# Patient Record
Sex: Female | Born: 1940
Health system: Southern US, Community
[De-identification: ages and names within clinical notes are randomized; demographics above are authoritative.]

## PROBLEM LIST (undated history)

## (undated) DIAGNOSIS — E119 Type 2 diabetes mellitus without complications: Secondary | ICD-10-CM

## (undated) DIAGNOSIS — E079 Disorder of thyroid, unspecified: Secondary | ICD-10-CM

## (undated) DIAGNOSIS — E039 Hypothyroidism, unspecified: Secondary | ICD-10-CM

## (undated) DIAGNOSIS — I10 Essential (primary) hypertension: Secondary | ICD-10-CM

## (undated) DIAGNOSIS — Z972 Presence of dental prosthetic device (complete) (partial): Secondary | ICD-10-CM

## (undated) DIAGNOSIS — H9190 Unspecified hearing loss, unspecified ear: Secondary | ICD-10-CM

## (undated) DIAGNOSIS — M1990 Unspecified osteoarthritis, unspecified site: Secondary | ICD-10-CM

## (undated) HISTORY — PX: CHOLECYSTECTOMY: SHX55

## (undated) HISTORY — PX: OTHER SURGICAL HISTORY: SHX169

## (undated) HISTORY — PX: TUBAL LIGATION: SHX77

## (undated) HISTORY — PX: TONSILLECTOMY: SUR1361

## (undated) HISTORY — PX: APPENDECTOMY: SHX54

## (undated) HISTORY — PX: URETHRAL DILATION: SUR417

## (undated) HISTORY — PX: TOTAL HIP ARTHROPLASTY: SHX124

## (undated) HISTORY — PX: HAND SURGERY: SHX662

## (undated) HISTORY — PX: THYROIDECTOMY: SHX17

---

## 2004-07-22 ENCOUNTER — Encounter: Admission: RE | Admit: 2004-07-22 | Discharge: 2004-10-20 | Payer: Self-pay | Admitting: Family Medicine

## 2004-09-07 ENCOUNTER — Emergency Department (HOSPITAL_COMMUNITY): Admission: EM | Admit: 2004-09-07 | Discharge: 2004-09-08 | Payer: Self-pay | Admitting: Emergency Medicine

## 2013-10-01 ENCOUNTER — Encounter (HOSPITAL_COMMUNITY): Payer: Self-pay | Admitting: Emergency Medicine

## 2013-10-01 ENCOUNTER — Inpatient Hospital Stay (HOSPITAL_COMMUNITY)
Admission: EM | Admit: 2013-10-01 | Discharge: 2013-10-03 | DRG: 641 | Disposition: A | Discharge status: Home / Self Care | Payer: Medicare Other | Attending: Internal Medicine | Admitting: Internal Medicine

## 2013-10-01 DIAGNOSIS — A088 Other specified intestinal infections: Secondary | ICD-10-CM | POA: Diagnosis present

## 2013-10-01 DIAGNOSIS — E860 Dehydration: Secondary | ICD-10-CM | POA: Diagnosis present

## 2013-10-01 DIAGNOSIS — E119 Type 2 diabetes mellitus without complications: Secondary | ICD-10-CM | POA: Diagnosis present

## 2013-10-01 DIAGNOSIS — Z9089 Acquired absence of other organs: Secondary | ICD-10-CM

## 2013-10-01 DIAGNOSIS — Z602 Problems related to living alone: Secondary | ICD-10-CM

## 2013-10-01 DIAGNOSIS — Z96649 Presence of unspecified artificial hip joint: Secondary | ICD-10-CM

## 2013-10-01 DIAGNOSIS — E876 Hypokalemia: Principal | ICD-10-CM | POA: Diagnosis present

## 2013-10-01 DIAGNOSIS — I10 Essential (primary) hypertension: Secondary | ICD-10-CM | POA: Diagnosis present

## 2013-10-01 DIAGNOSIS — N390 Urinary tract infection, site not specified: Secondary | ICD-10-CM | POA: Diagnosis present

## 2013-10-01 DIAGNOSIS — R197 Diarrhea, unspecified: Secondary | ICD-10-CM | POA: Diagnosis present

## 2013-10-01 DIAGNOSIS — E871 Hypo-osmolality and hyponatremia: Secondary | ICD-10-CM | POA: Diagnosis present

## 2013-10-01 DIAGNOSIS — R112 Nausea with vomiting, unspecified: Secondary | ICD-10-CM

## 2013-10-01 DIAGNOSIS — Z880 Allergy status to penicillin: Secondary | ICD-10-CM

## 2013-10-01 DIAGNOSIS — I498 Other specified cardiac arrhythmias: Secondary | ICD-10-CM | POA: Diagnosis present

## 2013-10-01 DIAGNOSIS — R9431 Abnormal electrocardiogram [ECG] [EKG]: Secondary | ICD-10-CM | POA: Diagnosis present

## 2013-10-01 DIAGNOSIS — Z79899 Other long term (current) drug therapy: Secondary | ICD-10-CM

## 2013-10-01 DIAGNOSIS — E039 Hypothyroidism, unspecified: Secondary | ICD-10-CM | POA: Diagnosis present

## 2013-10-01 HISTORY — DX: Type 2 diabetes mellitus without complications: E11.9

## 2013-10-01 HISTORY — DX: Disorder of thyroid, unspecified: E07.9

## 2013-10-01 HISTORY — DX: Essential (primary) hypertension: I10

## 2013-10-01 LAB — COMPREHENSIVE METABOLIC PANEL
ALBUMIN: 3.6 g/dL (ref 3.5–5.2)
ALK PHOS: 111 U/L (ref 39–117)
ALT: 14 U/L (ref 0–35)
AST: 31 U/L (ref 0–37)
BILIRUBIN TOTAL: 0.5 mg/dL (ref 0.3–1.2)
BUN: 12 mg/dL (ref 6–23)
CO2: 30 mEq/L (ref 19–32)
Calcium: 8.9 mg/dL (ref 8.4–10.5)
Chloride: 95 mEq/L — ABNORMAL LOW (ref 96–112)
Creatinine, Ser: 0.76 mg/dL (ref 0.50–1.10)
GFR calc Af Amer: >90 mL/min (ref >90)
GFR calc non Af Amer: 81 mL/min — ABNORMAL LOW (ref >90)
Glucose, Bld: 142 mg/dL — ABNORMAL HIGH (ref 70–99)
POTASSIUM: 2.7 meq/L — AB (ref 3.7–5.3)
Sodium: 136 mEq/L — ABNORMAL LOW (ref 137–147)
TOTAL PROTEIN: 6.9 g/dL (ref 6.0–8.3)

## 2013-10-01 LAB — URINE MICROSCOPIC-ADD ON

## 2013-10-01 LAB — URINALYSIS, ROUTINE W REFLEX MICROSCOPIC
Bilirubin Urine: NEGATIVE
GLUCOSE, UA: NEGATIVE mg/dL
Hgb urine dipstick: NEGATIVE
KETONES UR: NEGATIVE mg/dL
Nitrite: NEGATIVE
PH: 6.5 (ref 5.0–8.0)
PROTEIN: NEGATIVE mg/dL
Specific Gravity, Urine: 1.019 (ref 1.005–1.030)
Urobilinogen, UA: 1 mg/dL (ref 0.0–1.0)

## 2013-10-01 LAB — CBC WITH DIFFERENTIAL/PLATELET
BASOS ABS: 0 10*3/uL (ref 0.0–0.1)
BASOS PCT: 0 % (ref 0–1)
Eosinophils Absolute: 0 10*3/uL (ref 0.0–0.7)
Eosinophils Relative: 0 % (ref 0–5)
HCT: 38.5 % (ref 36.0–46.0)
HEMOGLOBIN: 13.3 g/dL (ref 12.0–15.0)
Lymphocytes Relative: 11 % — ABNORMAL LOW (ref 12–46)
Lymphs Abs: 0.8 10*3/uL (ref 0.7–4.0)
MCH: 29.8 pg (ref 26.0–34.0)
MCHC: 34.5 g/dL (ref 30.0–36.0)
MCV: 86.1 fL (ref 78.0–100.0)
MONOS PCT: 9 % (ref 3–12)
Monocytes Absolute: 0.6 10*3/uL (ref 0.1–1.0)
NEUTROS ABS: 5.4 10*3/uL (ref 1.7–7.7)
NEUTROS PCT: 80 % — AB (ref 43–77)
PLATELETS: 173 10*3/uL (ref 150–400)
RBC: 4.47 MIL/uL (ref 3.87–5.11)
RDW: 12.8 % (ref 11.5–15.5)
WBC: 6.7 10*3/uL (ref 4.0–10.5)

## 2013-10-01 LAB — CBG MONITORING, ED: GLUCOSE-CAPILLARY: 138 mg/dL — AB (ref 70–99)

## 2013-10-01 MED ORDER — POTASSIUM CHLORIDE CRYS ER 20 MEQ PO TBCR: 20.0000 meq | EXTENDED_RELEASE_TABLET | Freq: Two times a day (BID) | ORAL | Status: DC

## 2013-10-01 MED ORDER — POTASSIUM CHLORIDE 10 MEQ/100ML IV SOLN
10.0000 meq | INTRAVENOUS | Status: AC
  Administered 2013-10-01 – 2013-10-02 (×3): 10 meq via INTRAVENOUS
  Filled 2013-10-01 (×3): qty 100

## 2013-10-01 MED ORDER — SODIUM CHLORIDE 0.9 % IV BOLUS (SEPSIS)
1000.0000 mL | Freq: Once | INTRAVENOUS | Status: AC
  Administered 2013-10-01: 1000 mL via INTRAVENOUS

## 2013-10-01 MED ORDER — ONDANSETRON HCL 4 MG PO TABS: 4.0000 mg | ORAL_TABLET | Freq: Four times a day (QID) | ORAL | Status: DC

## 2013-10-01 MED ORDER — POTASSIUM CHLORIDE CRYS ER 20 MEQ PO TBCR
40.0000 meq | EXTENDED_RELEASE_TABLET | Freq: Once | ORAL | Status: AC
  Administered 2013-10-01: 40 meq via ORAL
  Filled 2013-10-01: qty 2

## 2013-10-01 MED ORDER — SODIUM CHLORIDE 0.9 % IV SOLN
INTRAVENOUS | Status: DC
  Administered 2013-10-01: 23:00:00 via INTRAVENOUS

## 2013-10-01 MED ORDER — ONDANSETRON HCL 4 MG/2ML IJ SOLN
4.0000 mg | Freq: Once | INTRAMUSCULAR | Status: AC
  Administered 2013-10-01: 4 mg via INTRAVENOUS
  Filled 2013-10-01: qty 2

## 2013-10-01 NOTE — ED Notes (Signed)
Pt states that she has been nauseous x 2 days; pt states that it got progressively worse last pm; pt c/o generalized weakness; N/V/D; pt reports no vomiting today and reports diarrhea x 3 -4 episodes; pt states that she is a diabetic and has had this in the past where it affected her blood sugar

## 2013-10-01 NOTE — Discharge Instructions (Signed)
Drink plenty of fluids. Take the potassium pills until gone. Use the zofran for nausea or vomiting. Take imodium OTC for diarrhea if needed. Have your doctor recheck your potassium level next week. Return to the ED if you get worse, such as having uncontrolled vomiting or diarrhea, weakness, fever, abdominal pain.    Hypokalemia Hypokalemia means that the amount of potassium in the blood is lower than normal.Potassium is a chemical, called an electrolyte, that helps regulate the amount of fluid in the body. It also stimulates muscle contraction and helps nerves function properly.Most of the body's potassium is inside of cells, and only a very small amount is in the blood. Because the amount in the blood is so small, minor changes can be life-threatening. CAUSES  Antibiotics.  Diarrhea or vomiting.  Using laxatives too much, which can cause diarrhea.  Chronic kidney disease.  Water pills (diuretics).  Eating disorders (bulimia).  Low magnesium level.  Sweating a lot. SIGNS AND SYMPTOMS  Weakness.  Constipation.  Fatigue.  Muscle cramps.  Mental confusion.  Skipped heartbeats or irregular heartbeat (palpitations).  Tingling or numbness. DIAGNOSIS  Your health care provider can diagnose hypokalemia with blood tests. In addition to checking your potassium level, your health care provider may also check other lab tests. TREATMENT Hypokalemia can be treated with potassium supplements taken by mouth or adjustments in your current medicines. If your potassium level is very low, you may need to get potassium through a vein (IV) and be monitored in the hospital. A diet high in potassium is also helpful. Foods high in potassium are:  Nuts, such as peanuts and pistachios.  Seeds, such as sunflower seeds and pumpkin seeds.  Peas, lentils, and lima beans.  Whole grain and bran cereals and breads.  Fresh fruit and vegetables, such as apricots, avocado, bananas, cantaloupe, kiwi,  oranges, tomatoes, asparagus, and potatoes.  Orange and tomato juices.  Red meats.  Fruit yogurt. HOME CARE INSTRUCTIONS  Take all medicines as prescribed by your health care provider.  Maintain a healthy diet by including nutritious food, such as fruits, vegetables, nuts, whole grains, and lean meats.  If you are taking a laxative, be sure to follow the directions on the label. SEEK MEDICAL CARE IF:  Your weakness gets worse.  You feel your heart pounding or racing.  You are vomiting or having diarrhea.  You are diabetic and having trouble keeping your blood glucose in the normal range. SEEK IMMEDIATE MEDICAL CARE IF:  You have chest pain, shortness of breath, or dizziness.  You are vomiting or having diarrhea for more than 2 days.  You faint. MAKE SURE YOU:   Understand these instructions.  Will watch your condition.  Will get help right away if you are not doing well or get worse. Document Released: 03/29/2005 Document Revised: 01/17/2013 Document Reviewed: 09/29/2012 [facility] Patient Information 2015 [place], [place]. This information is not intended to replace advice given to you by your health care provider. Make sure you discuss any questions you have with your health care provider.  Nausea and Vomiting Nausea is a sick feeling that often comes before throwing up (vomiting). Vomiting is a reflex where stomach contents come out of your mouth. Vomiting can cause severe loss of body fluids (dehydration). Children and elderly adults can become dehydrated quickly, especially if they also have diarrhea. Nausea and vomiting are symptoms of a condition or disease. It is important to find the cause of your symptoms. CAUSES   Direct irritation of the stomach  lining. This irritation can result from increased acid production (gastroesophageal reflux disease), infection, food poisoning, taking certain medicines (such as nonsteroidal anti-inflammatory drugs), alcohol use, or  tobacco use.  Signals from the brain.These signals could be caused by a headache, heat exposure, an inner ear disturbance, increased pressure in the brain from injury, infection, a tumor, or a concussion, pain, emotional stimulus, or metabolic problems.  An obstruction in the gastrointestinal tract (bowel obstruction).  Illnesses such as diabetes, hepatitis, gallbladder problems, appendicitis, kidney problems, cancer, sepsis, atypical symptoms of a heart attack, or eating disorders.  Medical treatments such as chemotherapy and radiation.  Receiving medicine that makes you sleep (general anesthetic) during surgery. DIAGNOSIS Your caregiver may ask for tests to be done if the problems do not improve after a few days. Tests may also be done if symptoms are severe or if the reason for the nausea and vomiting is not clear. Tests may include:  Urine tests.  Blood tests.  Stool tests.  Cultures (to look for evidence of infection).  X-rays or other imaging studies. Test results can help your caregiver make decisions about treatment or the need for additional tests. TREATMENT You need to stay well hydrated. Drink frequently but in small amounts.You may wish to drink water, sports drinks, clear broth, or eat frozen ice pops or gelatin dessert to help stay hydrated.When you eat, eating slowly may help prevent nausea.There are also some antinausea medicines that may help prevent nausea. HOME CARE INSTRUCTIONS   Take all medicine as directed by your caregiver.  If you do not have an appetite, do not force yourself to eat. However, you must continue to drink fluids.  If you have an appetite, eat a normal diet unless your caregiver tells you differently.  Eat a variety of complex carbohydrates (rice, wheat, potatoes, bread), lean meats, yogurt, fruits, and vegetables.  Avoid high-fat foods because they are more difficult to digest.  Drink enough water and fluids to keep your urine clear or  pale yellow.  If you are dehydrated, ask your caregiver for specific rehydration instructions. Signs of dehydration may include:  Severe thirst.  Dry lips and mouth.  Dizziness.  Dark urine.  Decreasing urine frequency and amount.  Confusion.  Rapid breathing or pulse. SEEK IMMEDIATE MEDICAL CARE IF:   You have blood or brown flecks (like coffee grounds) in your vomit.  You have black or bloody stools.  You have a severe headache or stiff neck.  You are confused.  You have severe abdominal pain.  You have chest pain or trouble breathing.  You do not urinate at least once every 8 hours.  You develop cold or clammy skin.  You continue to vomit for longer than 24 to 48 hours.  You have a fever. MAKE SURE YOU:   Understand these instructions.  Will watch your condition.  Will get help right away if you are not doing well or get worse. Document Released: 03/29/2005 Document Revised: 06/21/2011 Document Reviewed: 08/26/2010 [facility] Patient Information 2015 [place], [place]. This information is not intended to replace advice given to you by your health care provider. Make sure you discuss any questions you have with your health care provider.  Diarrhea Diarrhea is frequent loose and watery bowel movements. It can cause you to feel weak and dehydrated. Dehydration can cause you to become tired and thirsty, have a dry mouth, and have decreased urination that often is dark yellow. Diarrhea is a sign of another problem, most often an infection that  will not last long. In most cases, diarrhea typically lasts 2-3 days. However, it can last longer if it is a sign of something more serious. It is important to treat your diarrhea as directed by your caregive to lessen or prevent future episodes of diarrhea. CAUSES  Some common causes include:  Gastrointestinal infections caused by viruses, bacteria, or parasites.  Food poisoning or food allergies.  Certain medicines, such as  antibiotics, chemotherapy, and laxatives.  Artificial sweeteners and fructose.  Digestive disorders. HOME CARE INSTRUCTIONS  Ensure adequate fluid intake (hydration): have 1 cup (8 oz) of fluid for each diarrhea episode. Avoid fluids that contain simple sugars or sports drinks, fruit juices, whole milk products, and sodas. Your urine should be clear or pale yellow if you are drinking enough fluids. Hydrate with an oral rehydration solution that you can purchase at pharmacies, retail stores, and online. You can prepare an oral rehydration solution at home by mixing the following ingredients together:   - tsp table salt.   tsp baking soda.   tsp salt substitute containing potassium chloride.  1  tablespoons sugar.  1 L (34 oz) of water.  Certain foods and beverages may increase the speed at which food moves through the gastrointestinal (GI) tract. These foods and beverages should be avoided and include:  Caffeinated and alcoholic beverages.  High-fiber foods, such as raw fruits and vegetables, nuts, seeds, and whole grain breads and cereals.  Foods and beverages sweetened with sugar alcohols, such as xylitol, sorbitol, and mannitol.  Some foods may be well tolerated and may help thicken stool including:  Starchy foods, such as rice, toast, pasta, low-sugar cereal, oatmeal, grits, baked potatoes, crackers, and bagels.  Bananas.  Applesauce.  Add probiotic-rich foods to help increase healthy bacteria in the GI tract, such as yogurt and fermented milk products.  Wash your hands well after each diarrhea episode.  Only take over-the-counter or prescription medicines as directed by your caregiver.  Take a warm bath to relieve any burning or pain from frequent diarrhea episodes. SEEK IMMEDIATE MEDICAL CARE IF:   You are unable to keep fluids down.  You have persistent vomiting.  You have blood in your stool, or your stools are black and tarry.  You do not urinate in 6-8  hours, or there is only a small amount of very dark urine.  You have abdominal pain that increases or localizes.  You have weakness, dizziness, confusion, or lightheadedness.  You have a severe headache.  Your diarrhea gets worse or does not get better.  You have a fever or persistent symptoms for more than 2-3 days.  You have a fever and your symptoms suddenly get worse. MAKE SURE YOU:   Understand these instructions.  Will watch your condition.  Will get help right away if you are not doing well or get worse. Document Released: 03/19/2002 Document Revised: 03/15/2012 Document Reviewed: 12/05/2011 [facility] Patient Information 2015 [place], [place]. This information is not intended to replace advice given to you by your health care provider. Make sure you discuss any questions you have with your health care provider.

## 2013-10-01 NOTE — Progress Notes (Signed)
  CARE MANAGEMENT ED NOTE 10/01/2013  Patient:  [name],[name]   Account Number:  [id number]  Date Initiated:  10/01/2013  Documentation initiated by:  Radford PaxFERRERO,AMY  Subjective/Objective Assessment:   Patient presents to Ed with genealized weakness and N/V/D     Subjective/Objective Assessment Detail:     Action/Plan:   Action/Plan Detail:   Anticipated DC Date:       Status Recommendation to Physician:   Result of Recommendation:    Other ED Services  Consult Working Plan    DC Planning Services  Other  PCP issues    Choice offered to / List presented to:            Status of service:  In process, will continue to follow  ED Comments:   ED Comments Detail:  EDCM spoke to patient at bedside.  [facility] [place].  Patient reports she see the PA not the regular doctor.  System updated.

## 2013-10-01 NOTE — ED Provider Notes (Addendum)
CSN: [id number]     Arrival date & time 10/01/13  2046 History   First MD Initiated Contact with Patient 10/01/13 2120     Chief Complaint  Patient presents with  . Weakness  . Diarrhea     (Consider location/radiation/quality/duration/timing/severity/associated sxs/prior Treatment) HPI Patient reports she started having some mild nausea last night about 9:00. It progressed throughout the night and today. She states she finally vomited when she arrived to the ED. She also had large amounts of diarrhea x3 at home and twice while in the ED. She was having some diffuse abdominal pain but that has resolved since she had the diarrhea. She has diet controlled diabetes and states her blood sugar at home today was 111. She denies seeing any blood in her bowel movements or vomitus. She denies having melena. She states she has been having some dizziness and lightheadedness. She denies any fever. She denies eating anything she thinks may have made her ill. She denies being around any one else who is ill.  PCP Dr Nathanial RancherHamrick  Past Medical History  Diagnosis Date  . Thyroid disease   . Diabetes mellitus without complication    Past Surgical History  Procedure Laterality Date  . Total hip arthroplasty      bilateral  . Cholecystectomy     No family history on file. History  Substance Use Topics  . Smoking status: Never Smoker   . Smokeless tobacco: Not on file  . Alcohol Use: No   Widowed Lives at home Lives alone  [place]   Grav Para Term Preterm Abortions TAB SAB Ect Mult Living                 Review of Systems  All other systems reviewed and are negative.     Allergies  Penicillins  Home Medications   Prior to Admission medications   Medication Sig Start Date End Date Taking? Authorizing Provider  hydrochlorothiazide (HYDRODIURIL) 50 MG tablet Take 50 mg by mouth daily.   Yes Historical Provider, MD  levothyroxine (SYNTHROID, LEVOTHROID) 100 MCG tablet Take 100 mcg by  mouth daily before breakfast.  09/04/13  Yes Historical Provider, MD  traMADol (ULTRAM) 50 MG tablet Take 50 mg by mouth every 6 (six) hours as needed for moderate pain.  09/11/13  Yes Historical Provider, MD  ondansetron (ZOFRAN) 4 MG tablet Take 1 tablet (4 mg total) by mouth every 6 (six) hours. 10/01/13   Ward GivensIva L Zyrell Carmean, MD  potassium chloride SA (K-DUR,KLOR-CON) 20 MEQ tablet Take 1 tablet (20 mEq total) by mouth 2 (two) times daily. 10/01/13   Ward GivensIva L Maximino Cozzolino, MD   BP 114/65  Pulse 48  Temp(Src) 98.5 F (36.9 C) (Oral)  Resp 16  SpO2 96%  Vital signs normal except for bradycardia which she states is typical Physical Exam  Nursing note and vitals reviewed. Constitutional: She is oriented to person, place, and time. She appears well-developed and well-nourished.  Non-toxic appearance. She does not appear ill. No distress.  HENT:  Head: Normocephalic and atraumatic.  Right Ear: External ear normal.  Left Ear: External ear normal.  Nose: Nose normal. No mucosal edema or rhinorrhea.  Mouth/Throat: Mucous membranes are normal. No dental abscesses or uvula swelling.  Tongue slightly dry  Eyes: Conjunctivae and EOM are normal. Pupils are equal, round, and reactive to light.  Neck: Normal range of motion and full passive range of motion without pain. Neck supple.  Cardiovascular: Normal rate, regular rhythm and normal heart  sounds.  Exam reveals no gallop and no friction rub.   No murmur heard. Pulmonary/Chest: Effort normal and breath sounds normal. No respiratory distress. She has no wheezes. She has no rhonchi. She has no rales. She exhibits no tenderness and no crepitus.  Abdominal: Soft. Normal appearance and bowel sounds are normal. She exhibits no distension. There is no tenderness. There is no rebound and no guarding.  Musculoskeletal: Normal range of motion. She exhibits no edema and no tenderness.  Moves all extremities well.   Neurological: She is alert and oriented to person, place, and  time. She has normal strength. No cranial nerve deficit.  Skin: Skin is warm, dry and intact. No rash noted. No erythema. There is pallor.  Psychiatric: She has a normal mood and affect. Her speech is normal and behavior is normal. Her mood appears not anxious.    ED Course  Procedures (including critical care time)  Medications  0.9 %  sodium chloride infusion ( Intravenous New Bag/Given 10/01/13 2234)  potassium chloride 10 mEq in 100 mL IVPB (10 mEq Intravenous New Bag/Given 10/01/13 2335)  sodium chloride 0.9 % bolus 1,000 mL (0 mLs Intravenous Stopped 10/01/13 2232)  ondansetron (ZOFRAN) injection 4 mg (4 mg Intravenous Given 10/01/13 2209)  potassium chloride SA (K-DUR,KLOR-CON) CR tablet 40 mEq (40 mEq Oral Given 10/01/13 2326)    I discussed patient's test results, she is adamant she does not want to be admitted. She states she is feeling much better. She was given both oral and IV potassium in the ED. At 23:45 she feels ready to try oral fluids. We discussed avoiding milk until the diarrhea is gone.   Pt left with Dr Nicanor Alcon at change of shift to finish her IV potassium runs.   Labs Review Results for orders placed during the hospital encounter of 10/01/13  CBC WITH DIFFERENTIAL      Result Value Ref Range   WBC 6.7  4.0 - 10.5 K/uL   RBC 4.47  3.87 - 5.11 MIL/uL   Hemoglobin 13.3  12.0 - 15.0 g/dL   HCT 16.1  09.6 - 04.5 %   MCV 86.1  78.0 - 100.0 fL   MCH 29.8  26.0 - 34.0 pg   MCHC 34.5  30.0 - 36.0 g/dL   RDW 40.9  81.1 - 91.4 %   Platelets 173  150 - 400 K/uL   Neutrophils Relative % 80 (*) 43 - 77 %   Neutro Abs 5.4  1.7 - 7.7 K/uL   Lymphocytes Relative 11 (*) 12 - 46 %   Lymphs Abs 0.8  0.7 - 4.0 K/uL   Monocytes Relative 9  3 - 12 %   Monocytes Absolute 0.6  0.1 - 1.0 K/uL   Eosinophils Relative 0  0 - 5 %   Eosinophils Absolute 0.0  0.0 - 0.7 K/uL   Basophils Relative 0  0 - 1 %   Basophils Absolute 0.0  0.0 - 0.1 K/uL  COMPREHENSIVE METABOLIC PANEL       Result Value Ref Range   Sodium 136 (*) 137 - 147 mEq/L   Potassium 2.7 (*) 3.7 - 5.3 mEq/L   Chloride 95 (*) 96 - 112 mEq/L   CO2 30  19 - 32 mEq/L   Glucose, Bld 142 (*) 70 - 99 mg/dL   BUN 12  6 - 23 mg/dL   Creatinine, Ser 7.82  0.50 - 1.10 mg/dL   Calcium 8.9  8.4 - 95.6 mg/dL  Total Protein 6.9  6.0 - 8.3 g/dL   Albumin 3.6  3.5 - 5.2 g/dL   AST 31  0 - 37 U/L   ALT 14  0 - 35 U/L   Alkaline Phosphatase 111  39 - 117 U/L   Total Bilirubin 0.5  0.3 - 1.2 mg/dL   GFR calc non Af Amer 81 (*) >90 mL/min   GFR calc Af Amer >90  >90 mL/min  URINALYSIS, ROUTINE W REFLEX MICROSCOPIC      Result Value Ref Range   Color, Urine YELLOW  YELLOW   APPearance CLOUDY (*) CLEAR   Specific Gravity, Urine 1.019  1.005 - 1.030   pH 6.5  5.0 - 8.0   Glucose, UA NEGATIVE  NEGATIVE mg/dL   Hgb urine dipstick NEGATIVE  NEGATIVE   Bilirubin Urine NEGATIVE  NEGATIVE   Ketones, ur NEGATIVE  NEGATIVE mg/dL   Protein, ur NEGATIVE  NEGATIVE mg/dL   Urobilinogen, UA 1.0  0.0 - 1.0 mg/dL   Nitrite NEGATIVE  NEGATIVE   Leukocytes, UA LARGE (*) NEGATIVE  URINE MICROSCOPIC-ADD ON      Result Value Ref Range   Squamous Epithelial / LPF RARE  RARE   WBC, UA 3-6  <3 WBC/hpf   RBC / HPF 0-2  <3 RBC/hpf   Bacteria, UA FEW (*) RARE  CBG MONITORING, ED      Result Value Ref Range   Glucose-Capillary 138 (*) 70 - 99 mg/dL   Laboratory interpretation all normal except for hypokalemia, mild hyponatremia and low chloride     Imaging Review No results found.   EKG Interpretation   Date/Time:  Monday October 01 2013 23:43:36 EDT Ventricular Rate:  46 PR Interval:  173 QRS Duration: 102 QT Interval:  609 QTC Calculation: 533 R Axis:   -38 Text Interpretation:  Sinus bradycardia Left axis deviation Low voltage,  precordial leads Borderline T abnormalities, diffuse leads Prolonged QT  interval U waves present No old tracing to compare Confirmed by Calin Ellery   MD-I, Melis Trochez ([id number]) on 10/01/2013 11:46:15 PM       MDM   Final diagnoses:  Nausea vomiting and diarrhea  Dehydration  Hypokalemia    New Prescriptions   ONDANSETRON (ZOFRAN) 4 MG TABLET    Take 1 tablet (4 mg total) by mouth every 6 (six) hours.   POTASSIUM CHLORIDE SA (K-DUR,KLOR-CON) 20 MEQ TABLET    Take 1 tablet (20 mEq total) by mouth 2 (two) times daily.    Plan discharge   Devoria AlbeIva Maury Bamba, MD, [facility]FACEP     Leona Pressly L Luvinia Lucy, MD 10/01/13 2356  Ward GivensIva L Eh Sesay, MD 10/02/13 [phone]

## 2013-10-02 ENCOUNTER — Encounter (HOSPITAL_COMMUNITY): Payer: Self-pay | Admitting: Internal Medicine

## 2013-10-02 DIAGNOSIS — R197 Diarrhea, unspecified: Secondary | ICD-10-CM

## 2013-10-02 DIAGNOSIS — E119 Type 2 diabetes mellitus without complications: Secondary | ICD-10-CM | POA: Diagnosis present

## 2013-10-02 DIAGNOSIS — R112 Nausea with vomiting, unspecified: Secondary | ICD-10-CM

## 2013-10-02 DIAGNOSIS — I10 Essential (primary) hypertension: Secondary | ICD-10-CM | POA: Diagnosis present

## 2013-10-02 DIAGNOSIS — E876 Hypokalemia: Principal | ICD-10-CM

## 2013-10-02 DIAGNOSIS — E039 Hypothyroidism, unspecified: Secondary | ICD-10-CM

## 2013-10-02 LAB — CBC WITH DIFFERENTIAL/PLATELET
Basophils Absolute: 0 10*3/uL (ref 0.0–0.1)
Basophils Relative: 0 % (ref 0–1)
EOS ABS: 0 10*3/uL (ref 0.0–0.7)
EOS PCT: 0 % (ref 0–5)
HEMATOCRIT: 34.4 % — AB (ref 36.0–46.0)
HEMOGLOBIN: 11.7 g/dL — AB (ref 12.0–15.0)
LYMPHS ABS: 0.6 10*3/uL — AB (ref 0.7–4.0)
LYMPHS PCT: 12 % (ref 12–46)
MCH: 29.5 pg (ref 26.0–34.0)
MCHC: 34 g/dL (ref 30.0–36.0)
MCV: 86.6 fL (ref 78.0–100.0)
MONO ABS: 0.5 10*3/uL (ref 0.1–1.0)
MONOS PCT: 10 % (ref 3–12)
Neutro Abs: 4 10*3/uL (ref 1.7–7.7)
Neutrophils Relative %: 78 % — ABNORMAL HIGH (ref 43–77)
Platelets: 134 10*3/uL — ABNORMAL LOW (ref 150–400)
RBC: 3.97 MIL/uL (ref 3.87–5.11)
RDW: 13 % (ref 11.5–15.5)
WBC: 5.2 10*3/uL (ref 4.0–10.5)

## 2013-10-02 LAB — COMPREHENSIVE METABOLIC PANEL
ALBUMIN: 2.9 g/dL — AB (ref 3.5–5.2)
ALT: 12 U/L (ref 0–35)
AST: 26 U/L (ref 0–37)
Alkaline Phosphatase: 86 U/L (ref 39–117)
BUN: 8 mg/dL (ref 6–23)
CHLORIDE: 105 meq/L (ref 96–112)
CO2: 26 mEq/L (ref 19–32)
CREATININE: 0.73 mg/dL (ref 0.50–1.10)
Calcium: 7.9 mg/dL — ABNORMAL LOW (ref 8.4–10.5)
GFR calc Af Amer: >90 mL/min (ref >90)
GFR calc non Af Amer: 82 mL/min — ABNORMAL LOW (ref >90)
Glucose, Bld: 95 mg/dL (ref 70–99)
Potassium: 3 mEq/L — ABNORMAL LOW (ref 3.7–5.3)
Sodium: 140 mEq/L (ref 137–147)
TOTAL PROTEIN: 5.7 g/dL — AB (ref 6.0–8.3)
Total Bilirubin: 0.3 mg/dL (ref 0.3–1.2)

## 2013-10-02 LAB — MAGNESIUM
MAGNESIUM: 1.8 mg/dL (ref 1.5–2.5)
Magnesium: 1.6 mg/dL (ref 1.5–2.5)

## 2013-10-02 LAB — GLUCOSE, CAPILLARY
GLUCOSE-CAPILLARY: 82 mg/dL (ref 70–99)
GLUCOSE-CAPILLARY: 104 mg/dL — AB (ref 70–99)
Glucose-Capillary: 89 mg/dL (ref 70–99)
Glucose-Capillary: 78 mg/dL (ref 70–99)
Glucose-Capillary: 101 mg/dL — ABNORMAL HIGH (ref 70–99)

## 2013-10-02 LAB — CLOSTRIDIUM DIFFICILE BY PCR: CDIFFPCR: NEGATIVE

## 2013-10-02 MED ORDER — POTASSIUM CHLORIDE 10 MEQ/100ML IV SOLN: 10.0000 meq | INTRAVENOUS | Status: DC

## 2013-10-02 MED ORDER — ACETAMINOPHEN 650 MG RE SUPP: 650.0000 mg | Freq: Four times a day (QID) | RECTAL | Status: DC | PRN

## 2013-10-02 MED ORDER — POTASSIUM CHLORIDE IN NACL 20-0.9 MEQ/L-% IV SOLN
Freq: Once | INTRAVENOUS | Status: AC
  Administered 2013-10-02: 06:00:00 via INTRAVENOUS
  Filled 2013-10-02: qty 1000

## 2013-10-02 MED ORDER — GLUCERNA SHAKE PO LIQD
237.0000 mL | Freq: Two times a day (BID) | ORAL | Status: DC
  Administered 2013-10-02 – 2013-10-03 (×2): 237 mL via ORAL
  Filled 2013-10-02 (×3): qty 237

## 2013-10-02 MED ORDER — POTASSIUM CHLORIDE CRYS ER 20 MEQ PO TBCR: 40.0000 meq | EXTENDED_RELEASE_TABLET | Freq: Once | ORAL | Status: DC

## 2013-10-02 MED ORDER — SODIUM CHLORIDE 0.9 % IV SOLN
Freq: Once | INTRAVENOUS | Status: AC
  Administered 2013-10-02: 03:00:00 via INTRAVENOUS

## 2013-10-02 MED ORDER — ONDANSETRON HCL 4 MG/2ML IJ SOLN: 4.0000 mg | Freq: Four times a day (QID) | INTRAMUSCULAR | Status: DC | PRN

## 2013-10-02 MED ORDER — LEVOTHYROXINE SODIUM 100 MCG PO TABS
100.0000 ug | ORAL_TABLET | Freq: Every day | ORAL | Status: DC
Start: 2013-10-02 — End: 2013-10-03
  Administered 2013-10-02 – 2013-10-03 (×2): 100 ug via ORAL
  Filled 2013-10-02 (×3): qty 1

## 2013-10-02 MED ORDER — ENOXAPARIN SODIUM 40 MG/0.4ML ~~LOC~~ SOLN
40.0000 mg | SUBCUTANEOUS | Status: DC
  Administered 2013-10-02 – 2013-10-03 (×2): 40 mg via SUBCUTANEOUS
  Filled 2013-10-02 (×2): qty 0.4

## 2013-10-02 MED ORDER — ACETAMINOPHEN 325 MG PO TABS: 650.0000 mg | ORAL_TABLET | Freq: Four times a day (QID) | ORAL | Status: DC | PRN

## 2013-10-02 MED ORDER — SODIUM CHLORIDE 0.9 % IJ SOLN
3.0000 mL | Freq: Two times a day (BID) | INTRAMUSCULAR | Status: DC
  Administered 2013-10-02: 3 mL via INTRAVENOUS

## 2013-10-02 MED ORDER — INSULIN ASPART 100 UNIT/ML ~~LOC~~ SOLN: 0.0000 [IU] | Freq: Three times a day (TID) | SUBCUTANEOUS | Status: DC

## 2013-10-02 MED ORDER — HYDRALAZINE HCL 20 MG/ML IJ SOLN
10.0000 mg | INTRAMUSCULAR | Status: DC | PRN
Start: 2013-10-02 — End: 2013-10-03
  Filled 2013-10-02: qty 0.5

## 2013-10-02 MED ORDER — ONDANSETRON HCL 4 MG PO TABS: 4.0000 mg | ORAL_TABLET | Freq: Four times a day (QID) | ORAL | Status: DC | PRN

## 2013-10-02 MED ORDER — LOPERAMIDE HCL 2 MG PO CAPS: 2.0000 mg | ORAL_CAPSULE | ORAL | Status: DC | PRN

## 2013-10-02 MED ORDER — POTASSIUM CHLORIDE CRYS ER 20 MEQ PO TBCR
40.0000 meq | EXTENDED_RELEASE_TABLET | Freq: Once | ORAL | Status: AC
  Administered 2013-10-02: 40 meq via ORAL
  Filled 2013-10-02: qty 2

## 2013-10-02 MED ORDER — CIPROFLOXACIN IN D5W 400 MG/200ML IV SOLN
400.0000 mg | Freq: Two times a day (BID) | INTRAVENOUS | Status: DC
  Administered 2013-10-02 – 2013-10-03 (×3): 400 mg via INTRAVENOUS
  Filled 2013-10-02 (×3): qty 200

## 2013-10-02 MED ORDER — TRAMADOL HCL 50 MG PO TABS: 50.0000 mg | ORAL_TABLET | Freq: Four times a day (QID) | ORAL | Status: DC | PRN

## 2013-10-02 MED ORDER — MAGNESIUM SULFATE 40 MG/ML IJ SOLN
2.0000 g | Freq: Once | INTRAMUSCULAR | Status: AC
  Administered 2013-10-02: 2 g via INTRAVENOUS
  Filled 2013-10-02: qty 50

## 2013-10-02 NOTE — Progress Notes (Addendum)
[facility] PROGRESS NOTE  Sandra McalpineShirley J Figueroa WUJ:[id number] DOB: [birth year] DOA: 10/01/2013 PCP: No primary provider on file.  Assessment/Plan: Severe hypokalemia Secondary to diarrhea and dehydration with prolonged QTC on admission. Currently stable on telemetry and potassium improving with correction by mouth and with IV fluids.  -Replenish magnesium (1.6)  Nausea vomiting and diarrhea Patient has had 8 episodes of watery diarrhea since admission. Possibly viral gastroenteritis. also had one episode of vomiting. Appears quite dehydrated. Continue IV hydration. No sick contacts or eating outside. No recent travel or being on antibiotics recently. - stool for C. difficile negative. Follow  GI pathogen panel. -Supportive care with IV fluids and antiemetics. will order Imodium.  Hypothyroidism Continue Synthroid  Hypertension Patient orthostatic on presentation. HCTZ discontinued. Placed on when necessary hydralazine  Diabetes mellitus Not on any medications. Monitor with sliding scale insulin  UTI Follow urine culture. We'll place on empiric ciprofloxacin   DVT prophylaxis: Lovenox  Diet: Advance to full liquids  Code Status: Full code Family Communication: Daughter at bedside Disposition Plan: Home tomorrow if symptoms improve   Consultants:  None  Procedures:  None  Antibiotics:  None  HPI/Subjective: Admission H&P from this morning reviewed. Patient seen and examined this morning. No further nausea or vomiting. 2 episodes of diarrhea since this morning. Denies any abdominal pain.  Objective: Filed Vitals:   10/02/13 0610  BP: 115/43  Pulse: 51  Temp: 99.2 F (37.3 C)  Resp: 20   No intake or output data in the 24 hours ending 10/02/13 1134 Filed Weights   10/02/13 0610  Weight: 58.8 kg (129 lb 10.1 oz)    Exam:   General: Elderly female in no acute distress  HEENT: No pallor, dry oral mucosa  Chest: Clear to auscultation bilaterally,  no added sounds  CVS: Normal S1 and S2, normal no gallop  Abdomen: Soft, nontender, nondistended, bowel sounds present  Extremities: Warm, no edema  CNS: AAO x3    Data Reviewed: Basic Metabolic Panel:  Recent Labs Lab 10/01/13 2205 10/02/13 0708  NA 136* 140  K 2.7* 3.0*  CL 95* 105  CO2 30 26  GLUCOSE 142* 95  BUN 12 8  CREATININE 0.76 0.73  CALCIUM 8.9 7.9*  MG 1.8 1.6   Liver Function Tests:  Recent Labs Lab 10/01/13 2205 10/02/13 0708  AST 31 26  ALT 14 12  ALKPHOS 111 86  BILITOT 0.5 0.3  PROT 6.9 5.7*  ALBUMIN 3.6 2.9*   No results found for this basename: LIPASE, AMYLASE,  in the last 168 hours No results found for this basename: AMMONIA,  in the last 168 hours CBC:  Recent Labs Lab 10/01/13 2205 10/02/13 0708  WBC 6.7 5.2  NEUTROABS 5.4 4.0  HGB 13.3 11.7*  HCT 38.5 34.4*  MCV 86.1 86.6  PLT 173 134*   Cardiac Enzymes: No results found for this basename: CKTOTAL, CKMB, CKMBINDEX, TROPONINI,  in the last 168 hours BNP (last 3 results) No results found for this basename: PROBNP,  in the last 8760 hours CBG:  Recent Labs Lab 10/01/13 2119 10/02/13 0608 10/02/13 0718  GLUCAP 138* 89 78    Recent Results (from the past 240 hour(s))  CLOSTRIDIUM DIFFICILE BY PCR     Status: None   Collection Time    10/02/13  7:15 AM      Result Value Ref Range Status   C difficile by pcr NEGATIVE  NEGATIVE Final   Comment: [facility]  Studies: No results found.  Scheduled Meds: . enoxaparin (LOVENOX) injection  40 mg Subcutaneous Q24H  . insulin aspart  0-9 Units Subcutaneous TID WC  . levothyroxine  100 mcg Oral QAC breakfast  . sodium chloride  3 mL Intravenous Q12H   Continuous Infusions:     Time spent: 20 minutes    DHUNGEL, NISHANT  [facility] Pager [phone] If 7PM-7AM, please contact night-coverage at [email or web address], password [email or web address] 10/02/2013, 11:34 AM  LOS: 1 day

## 2013-10-02 NOTE — Progress Notes (Signed)
INITIAL NUTRITION ASSESSMENT  DOCUMENTATION CODES Per approved criteria  -Not Applicable   INTERVENTION: -Recommend Glucerna Shake po BID, each supplement provides 220 kcal and 10 grams of protein as tolerated. RD to provide nutrition supplement coupons -Reviewed DM diet recommendations, encouraged balanced meals and snacks -Diet advancement per MD   NUTRITION DIAGNOSIS: Inadequate oral intake related to early satiety/depression as evidenced by PO intake <75%, 4.4% body weight loss in one month  Goal: Pt to meet >/= 90% of their estimated nutrition needs    Monitor:  Total protein/energy intake, labs, weights, GI profile, diet education needs  Reason for Assessment: MST  73 y.o. female  Admitting Dx: Hypokalemia  ASSESSMENT: [name] is a 73 y.o. female history of hypertension, hypothyroidism and diabetes mellitus started experiencing diarrhea since yesterday. In the ER patient also had nausea and vomiting  -Pt reported overall decreased appetite for past one month after the passing of her husband. Endorsed a 4-8 lbs weight loss -Diet recall indicates pt consumes one meal/day, and snacks on dry cereals, fruits, yogurts and cottage cheese. Drinks one protein drink nightly -Experienced nausea/vomiting and diarrhea pta. MD noted pt with possible viral gastroenteritis. Tolerating small amount, <50% of clear liquid diet. Denied nausea after broth, however did report feelings of early satiety -Was willing to trial Glucerna shakes BID once diet advancement tolerated. RD to provide nutrition supplement coupons -Discussed balanced meals and encouraged protein source with snacks for additional nutrient replenishment and weight maintenance -Pt with low potassium, tries to incorporate high potassium foods and reads food labels for potassium, protein and fat content  Height: Ht Readings from Last 1 Encounters:  10/02/13 5\' 4"  (1.626 m)    Weight: Wt Readings from Last 1  Encounters:  10/02/13 129 lb 10.1 oz (58.8 kg)    Ideal Body Weight: 120 lbs  % Ideal Body Weight: 93%  Wt Readings from Last 10 Encounters:  10/02/13 129 lb 10.1 oz (58.8 kg)    Usual Body Weight: 135 lbs   % Usual Body Weight: 96%  BMI:  Body mass index is 22.24 kg/(m^2).  Estimated Nutritional Needs: Kcal: 1500-1700 Protein: 60-70 gram Fluid: >/= 1700 ml/daily  Skin: WDL  Diet Order: Clear Liquid  EDUCATION NEEDS: -Education needs addressed  No intake or output data in the 24 hours ending 10/02/13 1121  Last BM: 6/23   Labs:   Recent Labs Lab 10/01/13 2205 10/02/13 0708  NA 136* 140  K 2.7* 3.0*  CL 95* 105  CO2 30 26  BUN 12 8  CREATININE 0.76 0.73  CALCIUM 8.9 7.9*  MG 1.8 1.6  GLUCOSE 142* 95    CBG (last 3)   Recent Labs  10/01/13 2119 10/02/13 0608 10/02/13 0718  GLUCAP 138* 89 78    Scheduled Meds: . enoxaparin (LOVENOX) injection  40 mg Subcutaneous Q24H  . insulin aspart  0-9 Units Subcutaneous TID WC  . levothyroxine  100 mcg Oral QAC breakfast  . sodium chloride  3 mL Intravenous Q12H    Continuous Infusions:   Past Medical History  Diagnosis Date  . Thyroid disease   . Diabetes mellitus without complication   . Hypertension     Past Surgical History  Procedure Laterality Date  . Total hip arthroplasty      bilateral  . Cholecystectomy    . Appendectomy      Lloyd HugerSarah F Lavona Norsworthy MS RD LDN Clinical Dietitian [phone]

## 2013-10-02 NOTE — Progress Notes (Signed)
Called to get report spoke with Pixie CasinoSecretary Debbie, nurse busy taking another pt to unit, will call back her return. SRP, RN

## 2013-10-02 NOTE — H&P (Addendum)
[facility] History and Physical  Sandra McalpineShirley J Kraai WUJ:[id number] DOB: March 24, 1941 DOA: 10/01/2013  Referring physician: ER physician. PCP: No primary provider on file.   Chief Complaint: Diarrhea.  HPI: [name] is a 73 y.o. female history of hypertension, hypothyroidism and diabetes mellitus started experiencing diarrhea since yesterday. In the ER patient also had nausea and vomiting. Denies any abdominal pain fever chills. Labs reveal severe hypokalemia with EKG changes showing long QT interval. Patient has been started IV potassium replacement and admitted for further management. Patient also was found to be orthostatic. Patient denies any recent use of antibiotics, recent travel or any food or sick contacts. Patient otherwise denies any chest pain or shortness of breath.   Review of Systems: As presented in the history of presenting illness, rest negative.  Past Medical History  Diagnosis Date  . Thyroid disease   . Diabetes mellitus without complication   . Hypertension    Past Surgical History  Procedure Laterality Date  . Total hip arthroplasty      bilateral  . Cholecystectomy    . Appendectomy     Social History:  reports that she has never smoked. She does not have any smokeless tobacco history on file. She reports that she does not drink alcohol or use illicit drugs. Where does patient live home. Can patient participate in ADLs? Yes.  Allergies  Allergen Reactions  . Penicillins Swelling    Family History:  Family History  Problem Relation Age of Onset  . CAD Neg Hx   . Diabetes Mellitus II Neg Hx       Prior to Admission medications   Medication Sig Start Date End Date Taking? Authorizing Provider  hydrochlorothiazide (HYDRODIURIL) 50 MG tablet Take 50 mg by mouth daily.   Yes Historical Provider, MD  levothyroxine (SYNTHROID, LEVOTHROID) 100 MCG tablet Take 100 mcg by mouth daily before breakfast.  09/04/13  Yes Historical Provider, MD   traMADol (ULTRAM) 50 MG tablet Take 50 mg by mouth every 6 (six) hours as needed for moderate pain.  09/11/13  Yes Historical Provider, MD  ondansetron (ZOFRAN) 4 MG tablet Take 1 tablet (4 mg total) by mouth every 6 (six) hours. 10/01/13   Ward GivensIva L Knapp, MD  potassium chloride SA (K-DUR,KLOR-CON) 20 MEQ tablet Take 1 tablet (20 mEq total) by mouth 2 (two) times daily. 10/01/13   Ward GivensIva L Knapp, MD    Physical Exam: Filed Vitals:   10/01/13 2057 10/01/13 2200 10/02/13 0208  BP: 114/65 122/46 113/40  Pulse: 48 49 49  Temp: 98.5 F (36.9 C)    TempSrc: Oral    Resp: 16 14 12   SpO2: 96% 97% 100%     General:  Well-developed and nourished.  Eyes: Anicteric no pallor.  ENT: No discharge from the ears eyes nose mouth.  Neck: No mass felt.  Cardiovascular: S1-S2 heard.  Respiratory: No rhonchi or crepitations.  Abdomen: Soft nontender bowel sounds present. No guarding rigidity.  Skin: No rash.  Musculoskeletal: No edema.  Psychiatric: Appears normal.  Neurologic: Alert and oriented to time place and person. Moves all extremities.  Labs on Admission:  Basic Metabolic Panel:  Recent Labs Lab 10/01/13 2205  NA 136*  K 2.7*  CL 95*  CO2 30  GLUCOSE 142*  BUN 12  CREATININE 0.76  CALCIUM 8.9  MG 1.8   Liver Function Tests:  Recent Labs Lab 10/01/13 2205  AST 31  ALT 14  ALKPHOS 111  BILITOT 0.5  PROT 6.9  ALBUMIN 3.6   No results found for this basename: LIPASE, AMYLASE,  in the last 168 hours No results found for this basename: AMMONIA,  in the last 168 hours CBC:  Recent Labs Lab 10/01/13 2205  WBC 6.7  NEUTROABS 5.4  HGB 13.3  HCT 38.5  MCV 86.1  PLT 173   Cardiac Enzymes: No results found for this basename: CKTOTAL, CKMB, CKMBINDEX, TROPONINI,  in the last 168 hours  BNP (last 3 results) No results found for this basename: PROBNP,  in the last 8760 hours CBG:  Recent Labs Lab 10/01/13 2119  GLUCAP 138*    Radiological Exams on  Admission: No results found.  EKG: Independently reviewed. Sinus bradycardia with QT interval of 609 and QTC of 533.  Assessment/Plan Principal Problem:   Hypokalemia Active Problems:   Diarrhea   Diabetes mellitus   HTN (hypertension)   Hypothyroidism   1. Severe hypokalemia with EKG changes - replace potassium IV and orally. Recheck metabolic panel with magnesium levels. Discontinue HCTZ for now. Recheck EKG after correction of electrolytes. 2. Diarrhea - check stool studies. Continue with hydration. 3. Hypertension - since patient is orthostatic and having hypokalemia discontinue HCTZ for now. Patient is on when necessary IV hydralazine. 4. Hypothyroidism - continue Synthroid. 5. History of diabetes mellitus - presently patient is on no medications and I have placed on sliding-scale coverage. 6. Possible UTI - check urine cultures. Presently on no antibiotics.    Code Status: Full code.  Family Communication: Family at the bedside.  Disposition Plan: Admit to inpatient.    KAKRAKANDY,ARSHAD N. [facility] Pager [phone].  If 7PM-7AM, please contact night-coverage [email or web address] Password [email or web address] 10/02/2013, 4:53 AM

## 2013-10-02 NOTE — ED Notes (Signed)
Pt given a cup of water. Pt states she was able to tolerate sips of water when taking her potassium pills.

## 2013-10-03 DIAGNOSIS — E860 Dehydration: Secondary | ICD-10-CM

## 2013-10-03 LAB — BASIC METABOLIC PANEL
BUN: 4 mg/dL — ABNORMAL LOW (ref 6–23)
CALCIUM: 8.2 mg/dL — AB (ref 8.4–10.5)
CO2: 26 mEq/L (ref 19–32)
CREATININE: 0.7 mg/dL (ref 0.50–1.10)
Chloride: 104 mEq/L (ref 96–112)
GFR calc non Af Amer: 83 mL/min — ABNORMAL LOW (ref >90)
Glucose, Bld: 98 mg/dL (ref 70–99)
Potassium: 3.5 mEq/L — ABNORMAL LOW (ref 3.7–5.3)
Sodium: 140 mEq/L (ref 137–147)

## 2013-10-03 LAB — GLUCOSE, CAPILLARY
GLUCOSE-CAPILLARY: 87 mg/dL (ref 70–99)
Glucose-Capillary: 96 mg/dL (ref 70–99)

## 2013-10-03 MED ORDER — CIPROFLOXACIN HCL 500 MG PO TABS: 500.0000 mg | ORAL_TABLET | Freq: Two times a day (BID) | ORAL | Status: DC

## 2013-10-03 NOTE — Discharge Summary (Signed)
Physician Discharge Summary  Sandra McalpineShirley J Figueroa ZOX:[id number] DOB: 1941/03/17 DOA: 10/01/2013  PCP: No primary provider on file.  Admit date: 10/01/2013 Discharge date: 10/03/2013  Time spent: 35 minutes  Recommendations for Outpatient Follow-up:  1. Follow up with PCP in 1-2 weeks 2. Note: HCTZ was held secondary to dehydration. BP has remained well controlled without BP meds. Please monitor bp as an outpatient and titrate bp meds as needed  Discharge Diagnoses:  Principal Problem:   Hypokalemia Active Problems:   Diarrhea   Diabetes mellitus   HTN (hypertension)   Hypothyroidism   Discharge Condition: Improved  Diet recommendation: Diabetic  Filed Weights   10/02/13 0610  Weight: 129 lb 10.1 oz (58.8 kg)    History of present illness:  [name] is a 73 y.o. female history of hypertension, hypothyroidism and diabetes mellitus started experiencing diarrhea since yesterday. In the ER patient also had nausea and vomiting. Denies any abdominal pain fever chills. Labs reveal severe hypokalemia with EKG changes showing long QT interval. Patient has been started IV potassium replacement and admitted for further management. Patient also was found to be orthostatic. Patient denies any recent use of antibiotics, recent travel or any food or sick contacts. Patient otherwise denies any chest pain or shortness of breath.   Hospital Course:  Severe hypokalemia  Secondary to diarrhea and dehydration with prolonged QTC on admission. Had remained stable on telemetry with potassium improving with correction by mouth and with IV fluids since admission.  Nausea vomiting and diarrhea  Possibly viral gastroenteritis. Continued on IV hydration. No known sick contacts. No recent travel or being on antibiotics recently.  - stool for C. difficile negative. -Clinically pt has improved since admission. Advancing diet to regular diet Hypothyroidism  - Continued Synthroid  Hypertension   Patient orthostatic on initial presentation. HCTZ discontinued. Will defer resuming bp meds to PCP on follow up Diabetes mellitus  Not on any medications. Monitor with sliding scale insulin  UTI  Follow urine culture. On empiric ciprofloxacin. Will cont on discharge   Discharge Exam: Filed Vitals:   10/02/13 1408 10/02/13 2257 10/03/13 0610 10/03/13 1309  BP: 123/56 109/56 112/44 117/37  Pulse: 51 46 48 42  Temp: 99.4 F (37.4 C) 98.2 F (36.8 C) 98.4 F (36.9 C) 97.7 F (36.5 C)  TempSrc: Oral Oral Oral Oral  Resp: 18 20 18 18   Height:      Weight:      SpO2: 100% 96% 97% 93%    General: Awake, in nad Cardiovascular: regular, s1, s2 Respiratory:  Normal resp effort, no wheezing  Discharge Instructions     Medication List    STOP taking these medications       hydrochlorothiazide 50 MG tablet  Commonly known as:  HYDRODIURIL      TAKE these medications       ciprofloxacin 500 MG tablet  Commonly known as:  CIPRO  Take 1 tablet (500 mg total) by mouth 2 (two) times daily.     levothyroxine 100 MCG tablet  Commonly known as:  SYNTHROID, LEVOTHROID  Take 100 mcg by mouth daily before breakfast.     ondansetron 4 MG tablet  Commonly known as:  ZOFRAN  Take 1 tablet (4 mg total) by mouth every 6 (six) hours.     potassium chloride SA 20 MEQ tablet  Commonly known as:  K-DUR,KLOR-CON  Take 1 tablet (20 mEq total) by mouth 2 (two) times daily.     traMADol 50 MG  tablet  Commonly known as:  ULTRAM  Take 50 mg by mouth every 6 (six) hours as needed for moderate pain.       Allergies  Allergen Reactions  . Penicillins Swelling   Follow-up Information   Schedule an appointment as soon as possible for a visit with Ailene RavelHAMRICK,MAURA L, MD.   Specialty:  Family Medicine   Contact information:   Dr. Burnell BlanksMaura Hamrick [address] [place] [id number] [phone]        The results of significant diagnostics from this hospitalization (including  imaging, microbiology, ancillary and laboratory) are listed below for reference.    Significant Diagnostic Studies: No results found.  Microbiology: Recent Results (from the past 240 hour(s))  URINE CULTURE     Status: None   Collection Time    10/01/13 10:00 PM      Result Value Ref Range Status   Specimen Description URINE, CLEAN CATCH   Final   Special Requests NONE   Final   Culture  Setup Time     Final   Value: 10/02/2013 10:03     [facility]   Colony Count PENDING   Incomplete   Culture     Final   Value: Culture reincubated for better growth     [facility]   Report Status PENDING   Incomplete  CLOSTRIDIUM DIFFICILE BY PCR     Status: None   Collection Time    10/02/13  7:15 AM      Result Value Ref Range Status   C difficile by pcr NEGATIVE  NEGATIVE Final   Comment: [facility]  STOOL CULTURE     Status: None   Collection Time    10/02/13  7:17 AM      Result Value Ref Range Status   Specimen Description STOOL   Final   Special Requests NONE   Final   Culture     Final   Value: Culture reincubated for better growth     [facility]formed at [facility]   Report Status PENDING   Incomplete     Labs: Basic Metabolic Panel:  Recent Labs Lab 10/01/13 2205 10/02/13 0708 10/03/13 0444  NA 136* 140 140  K 2.7* 3.0* 3.5*  CL 95* 105 104  CO2 30 26 26   GLUCOSE 142* 95 98  BUN 12 8 4*  CREATININE 0.76 0.73 0.70  CALCIUM 8.9 7.9* 8.2*  MG 1.8 1.6  --    Liver Function Tests:  Recent Labs Lab 10/01/13 2205 10/02/13 0708  AST 31 26  ALT 14 12  ALKPHOS 111 86  BILITOT 0.5 0.3  PROT 6.9 5.7*  ALBUMIN 3.6 2.9*   No results found for this basename: LIPASE, AMYLASE,  in the last 168 hours No results found for this basename: AMMONIA,  in the last 168 hours CBC:  Recent Labs Lab 10/01/13 2205 10/02/13 0708  WBC 6.7 5.2  NEUTROABS 5.4 4.0  HGB 13.3 11.7*  HCT 38.5 34.4*  MCV 86.1  86.6  PLT 173 134*   Cardiac Enzymes: No results found for this basename: CKTOTAL, CKMB, CKMBINDEX, TROPONINI,  in the last 168 hours BNP: BNP (last 3 results) No results found for this basename: PROBNP,  in the last 8760 hours CBG:  Recent Labs Lab 10/02/13 1153 10/02/13 1716 10/02/13 2254 10/03/13 0721 10/03/13 1150  GLUCAP 82 104* 101* 96 87       Signed:  CHIU,  STEPHEN K  [facility] 10/03/2013, 4:29 PM

## 2013-10-05 LAB — URINE CULTURE: Colony Count: 15000

## 2013-10-06 LAB — STOOL CULTURE

## 2015-02-07 ENCOUNTER — Other Ambulatory Visit: Payer: Self-pay | Admitting: Orthopedic Surgery

## 2015-02-12 NOTE — Patient Instructions (Signed)
Sandra McalpineShirley J Figueroa  02/12/2015   Your procedure is scheduled on: 11/11/2-16    [facility]  [facility] to 3rd floor to  Short Stay Center at      1100 AM.  Call this number if you have problems the morning of surgery [phone]   Remember: ONLY 1 PERSON MAY GO WITH YOU TO SHORT STAY TO GET  READY MORNING OF YOUR SURGERY.  Do not eat food or drink liquids :After Midnight.     Take these medicines the morning of surgery with A SIP OF WATER: Synthroid                                 You may not have any metal on your body including hair pins and              piercings  Do not wear jewelry, make-up, lotions, powders or perfumes, deodorant             Do not wear nail polish.  Do not shave  48 hours prior to surgery.                 Do not bring valuables to the hospital. [place] IS NOT             RESPONSIBLE   FOR VALUABLES.  Contacts, dentures or bridgework may not be worn into surgery.  Leave suitcase in the car. After surgery it may be brought to your room.         Special Instructions: coughing and deep breathing exercises, leg exercises               Please read over the following fact sheets you were given: _____________________________________________________________________             [facility] - Preparing [facility] Before surgery, you can play an important role.  Because skin is not sterile, your skin needs to be as free of germs as possible.  You can reduce the number of germs on your skin by washing with CHG (chlorahexidine gluconate) soap before surgery.  CHG is an antiseptic cleaner which kills germs and bonds with the skin to continue killing germs even after washing. Please DO NOT use if you have an allergy to CHG or antibacterial soaps.  If your skin becomes reddened/irritated stop using the CHG and inform your nurse when you arrive at Short Stay. Do not shave (including legs and underarms) for at  least 48 hours prior to the first CHG shower.  You may shave your face/neck. Please follow these instructions carefully:  1.  Shower with CHG Soap the night before surgery and the  morning of Surgery.  2.  If you choose to wash your hair, wash your hair first as usual with your  normal  shampoo.  3.  After you shampoo, rinse your hair and body thoroughly to remove the  shampoo.                           4.  Use CHG as you would any other liquid soap.  You can apply chg directly  to the skin and wash  Gently with a scrungie or clean washcloth.  5.  Apply the CHG Soap to your body ONLY FROM THE NECK DOWN.   Do not use on face/ open                           Wound or open sores. Avoid contact with eyes, ears mouth and genitals (private parts).                       Wash face,  Genitals (private parts) with your normal soap.             6.  Wash thoroughly, paying special attention to the area where your surgery  will be performed.  7.  Thoroughly rinse your body with warm water from the neck down.  8.  DO NOT shower/wash with your normal soap after using and rinsing off  the CHG Soap.                9.  Pat yourself dry with a clean towel.            10.  Wear clean pajamas.            11.  Place clean sheets on your bed the night of your first shower and do not  sleep with pets. Day of Surgery : Do not apply any lotions/deodorants the morning of surgery.  Please wear clean clothes to the hospital/surgery center.  FAILURE TO FOLLOW THESE INSTRUCTIONS MAY RESULT IN THE CANCELLATION OF YOUR SURGERY PATIENT SIGNATURE_________________________________  NURSE SIGNATURE__________________________________  ________________________________________________________________________  WHAT IS A BLOOD TRANSFUSION? Blood Transfusion Information  A transfusion is the replacement of blood or some of its parts. Blood is made up of multiple cells which provide different functions.  Red  blood cells carry oxygen and are used for blood loss replacement.  White blood cells fight against infection.  Platelets control bleeding.  Plasma helps clot blood.  Other blood products are available for specialized needs, such as hemophilia or other clotting disorders. BEFORE THE TRANSFUSION  Who gives blood for transfusions?   Healthy volunteers who are fully evaluated to make sure their blood is safe. This is blood bank blood. Transfusion therapy is the safest it has ever been in the practice of medicine. Before blood is taken from a donor, a complete history is taken to make sure that person has no history of diseases nor engages in risky social behavior (examples are intravenous drug use or sexual activity with multiple partners). The donor's travel history is screened to minimize risk of transmitting infections, such as malaria. The donated blood is tested for signs of infectious diseases, such as HIV and hepatitis. The blood is then tested to be sure it is compatible with you in order to minimize the chance of a transfusion reaction. If you or a relative donates blood, this is often done in anticipation of surgery and is not appropriate for emergency situations. It takes many days to process the donated blood. RISKS AND COMPLICATIONS Although transfusion therapy is very safe and saves many lives, the main dangers of transfusion include:  1. Getting an infectious disease. 2. Developing a transfusion reaction. This is an allergic reaction to something in the blood you were given. Every precaution is taken to prevent this. The decision to have a blood transfusion has been considered carefully by your caregiver before blood is given. Blood is not given unless the benefits outweigh  the risks. AFTER THE TRANSFUSION  Right after receiving a blood transfusion, you will usually feel much better and more energetic. This is especially true if your red blood cells have gotten low (anemic). The  transfusion raises the level of the red blood cells which carry oxygen, and this usually causes an energy increase.  The nurse administering the transfusion will monitor you carefully for complications. HOME CARE INSTRUCTIONS  No special instructions are needed after a transfusion. You may find your energy is better. Speak with your caregiver about any limitations on activity for underlying diseases you may have. SEEK MEDICAL CARE IF:   Your condition is not improving after your transfusion.  You develop redness or irritation at the intravenous (IV) site. SEEK IMMEDIATE MEDICAL CARE IF:  Any of the following symptoms occur over the next 12 hours:  Shaking chills.  You have a temperature by mouth above 102 F (38.9 C), not controlled by medicine.  Chest, back, or muscle pain.  People around you feel you are not acting correctly or are confused.  Shortness of breath or difficulty breathing.  Dizziness and fainting.  You get a rash or develop hives.  You have a decrease in urine output.  Your urine turns a dark color or changes to pink, red, or brown. Any of the following symptoms occur over the next 10 days:  You have a temperature by mouth above 102 F (38.9 C), not controlled by medicine.  Shortness of breath.  Weakness after normal activity.  The white part of the eye turns yellow (jaundice).  You have a decrease in the amount of urine or are urinating less often.  Your urine turns a dark color or changes to pink, red, or brown. Document Released: 03/26/2000 Document Revised: 06/21/2011 Document Reviewed: 11/13/2007 ExitCare Patient Information 2014 [place].  _______________________________________________________________________  Incentive Spirometer  An incentive spirometer is a tool that can help keep your lungs clear and active. This tool measures how well you are filling your lungs with each breath. Taking long deep breaths may help reverse or  decrease the chance of developing breathing (pulmonary) problems (especially infection) following:  A long period of time when you are unable to move or be active. BEFORE THE PROCEDURE   If the spirometer includes an indicator to show your best effort, your nurse or respiratory therapist will set it to a desired goal.  If possible, sit up straight or lean slightly forward. Try not to slouch.  Hold the incentive spirometer in an upright position. INSTRUCTIONS FOR USE  3. Sit on the edge of your bed if possible, or sit up as far as you can in bed or on a chair. 4. Hold the incentive spirometer in an upright position. 5. Breathe out normally. 6. Place the mouthpiece in your mouth and seal your lips tightly around it. 7. Breathe in slowly and as deeply as possible, raising the piston or the ball toward the top of the column. 8. Hold your breath for 3-5 seconds or for as long as possible. Allow the piston or ball to fall to the bottom of the column. 9. Remove the mouthpiece from your mouth and breathe out normally. 10. Rest for a few seconds and repeat Steps 1 through 7 at least 10 times every 1-2 hours when you are awake. Take your time and take a few normal breaths between deep breaths. 11. The spirometer may include an indicator to show your best effort. Use the indicator as a goal to work  toward during each repetition. 12. After each set of 10 deep breaths, practice coughing to be sure your lungs are clear. If you have an incision (the cut made at the time of surgery), support your incision when coughing by placing a pillow or rolled up towels firmly against it. Once you are able to get out of bed, walk around indoors and cough well. You may stop using the incentive spirometer when instructed by your caregiver.  RISKS AND COMPLICATIONS  Take your time so you do not get dizzy or light-headed.  If you are in pain, you may need to take or ask for pain medication before doing incentive  spirometry. It is harder to take a deep breath if you are having pain. AFTER USE  Rest and breathe slowly and easily.  It can be helpful to keep track of a log of your progress. Your caregiver can provide you with a simple table to help with this. If you are using the spirometer at home, follow these instructions: [place] IF:   You are having difficultly using the spirometer.  You have trouble using the spirometer as often as instructed.  Your pain medication is not giving enough relief while using the spirometer.  You develop fever of 100.5 F (38.1 C) or higher. SEEK IMMEDIATE MEDICAL CARE IF:   You cough up bloody sputum that had not been present before.  You develop fever of 102 F (38.9 C) or greater.  You develop worsening pain at or near the incision site. MAKE SURE YOU:   Understand these instructions.  Will watch your condition.  Will get help right away if you are not doing well or get worse. Document Released: 08/09/2006 Document Revised: 06/21/2011 Document Reviewed: 10/10/2006 [facility] Patient Information 2014 [place], [place].   ________________________________________________________________________

## 2015-02-12 NOTE — H&P (Signed)
TOTAL KNEE ADMISSION H&P  Patient is being admitted for left total knee arthroplasty.  Subjective:  Chief Complaint:left knee pain.  HPI: [name], [age] y.o. female, has a history of pain and functional disability in the left knee due to arthritis and . and has failed non-surgical conservative treatments for greater than 12 weeks to includeNSAID's and/or analgesics, corticosteriod injections, viscosupplementation injections, supervised PT with diminished ADL's post treatment, weight reduction as appropriate and activity modification.  Onset of symptoms was gradual, starting 4 years ago with gradually worsening course since that time. The patient noted prior procedures on the knee to include  arthroscopy on the left knee(s).  Patient currently rates pain in the left knee(s) at 8 out of 10 with activity. Patient has night pain, worsening of pain with activity and weight bearing, pain that interferes with activities of daily living, pain with passive range of motion and joint swelling.  Patient has evidence of subchondral sclerosis, periarticular osteophytes, joint subluxation and joint space narrowing by imaging studies. This patient has had osteoarthritis. There is no active infection.  Patient Active Problem List   Diagnosis Date Noted  . Hypokalemia 10/02/2013  . Diarrhea 10/02/2013  . Diabetes mellitus (HCC) 10/02/2013  . HTN (hypertension) 10/02/2013  . Hypothyroidism 10/02/2013   Past Medical History  Diagnosis Date  . Thyroid disease   . Diabetes mellitus without complication   . Hypertension     Past Surgical History  Procedure Laterality Date  . Total hip arthroplasty      bilateral  . Cholecystectomy    . Appendectomy      No prescriptions prior to admission   Allergies  Allergen Reactions  . Oxycodone Other (See Comments)    Makes her feel "weird" and she prefers not to take it  . Penicillins Swelling    Has patient had a PCN reaction causing immediate rash,  facial/tongue/throat swelling, SOB or lightheadedness with hypotension: No Has patient had a PCN reaction causing severe rash involving mucus membranes or skin necrosis: No Has patient had a PCN reaction that required hospitalization No Has patient had a PCN reaction occurring within the last 10 years: No If all of the above answers are "NO", then may proceed with Cephalosporin use.     Social History  Substance Use Topics  . Smoking status: Never Smoker   . Smokeless tobacco: Not on file  . Alcohol Use: No    Family History  Problem Relation Age of Onset  . CAD Neg Hx   . Diabetes Mellitus II Neg Hx      Review of Systems  Constitutional: Negative.   HENT: Negative.   Eyes: Negative.   Respiratory: Negative.   Cardiovascular: Negative.   Gastrointestinal: Negative.   Genitourinary: Negative.   Musculoskeletal: Positive for joint pain.  Skin: Negative.   Neurological: Negative.   Endo/Heme/Allergies: Negative.   Psychiatric/Behavioral: Negative.     Objective:  Physical Exam  Constitutional: She is oriented to person, place, and time. She appears well-developed.  HENT:  Head: Normocephalic.  Eyes: EOM are normal.  Neck: Normal range of motion.  Cardiovascular: Normal rate, normal heart sounds and intact distal pulses.   Respiratory: Effort normal and breath sounds normal.  GI: Soft. Bowel sounds are normal.  Genitourinary:  Deferred  Neurological: She is alert and oriented to person, place, and time. She has normal reflexes.  Skin: Skin is warm and dry.  Psychiatric: Her behavior is normal.    Vital signs in last 24  hours:    Labs:   Estimated body mass index is 22.24 kg/(m^2) as calculated from the following:   Height as of 10/02/13:  (1.626 m).   Weight as of 10/01/13: 58.8 kg (129 lb 10.1 oz).   Imaging Review Plain radiographs demonstrate severe degenerative joint disease of the left knee(s). The overall alignment ismild varus. The bone quality  appears to be good for age and reported activity level.  Assessment/Plan:  End stage arthritis, left knee   The patient history, physical examination, clinical judgment of the provider and imaging studies are consistent with end stage degenerative joint disease of the left knee(s) and total knee arthroplasty is deemed medically necessary. The treatment options including medical management, injection therapy arthroscopy and arthroplasty were discussed at length. The risks and benefits of total knee arthroplasty were presented and reviewed. The risks due to aseptic loosening, infection, stiffness, patella tracking problems, thromboembolic complications and other imponderables were discussed. The patient acknowledged the explanation, agreed to proceed with the plan and consent was signed. Patient is being admitted for inpatient treatment for surgery, pain control, PT, OT, prophylactic antibiotics, VTE prophylaxis, progressive ambulation and ADL's and discharge planning. The patient is planning to be discharged home with home health services.  Contraindications and adverse affects of Tranexamic acid discussed in detail. Patient denies any of these at this time and understands the risks and benefits.

## 2015-02-13 ENCOUNTER — Encounter (HOSPITAL_COMMUNITY): Payer: Self-pay

## 2015-02-13 ENCOUNTER — Other Ambulatory Visit (HOSPITAL_COMMUNITY): Payer: Self-pay

## 2015-02-13 ENCOUNTER — Encounter (HOSPITAL_COMMUNITY)
Admission: RE | Admit: 2015-02-13 | Discharge: 2015-02-13 | Disposition: A | Discharge status: Home / Self Care | Payer: Medicare HMO | Source: Ambulatory Visit | Attending: Specialist | Admitting: Specialist

## 2015-02-13 DIAGNOSIS — Z01818 Encounter for other preprocedural examination: Secondary | ICD-10-CM | POA: Insufficient documentation

## 2015-02-13 HISTORY — DX: Unspecified osteoarthritis, unspecified site: M19.90

## 2015-02-13 HISTORY — DX: Hypothyroidism, unspecified: E03.9

## 2015-02-13 LAB — BASIC METABOLIC PANEL
Anion gap: 7 (ref 5–15)
BUN: 14 mg/dL (ref 6–20)
CHLORIDE: 99 mmol/L — AB (ref 101–111)
CO2: 30 mmol/L (ref 22–32)
CREATININE: 0.81 mg/dL (ref 0.44–1.00)
Calcium: 9.1 mg/dL (ref 8.9–10.3)
GFR calc Af Amer: >60 mL/min (ref >60)
GFR calc non Af Amer: >60 mL/min (ref >60)
GLUCOSE: 103 mg/dL — AB (ref 65–99)
Potassium: 4.6 mmol/L (ref 3.5–5.1)
SODIUM: 136 mmol/L (ref 135–145)

## 2015-02-13 LAB — URINALYSIS, ROUTINE W REFLEX MICROSCOPIC
Bilirubin Urine: NEGATIVE
GLUCOSE, UA: NEGATIVE mg/dL
HGB URINE DIPSTICK: NEGATIVE
KETONES UR: NEGATIVE mg/dL
Leukocytes, UA: NEGATIVE
Nitrite: NEGATIVE
PH: 5.5 (ref 5.0–8.0)
PROTEIN: NEGATIVE mg/dL
Specific Gravity, Urine: 1.006 (ref 1.005–1.030)
Urobilinogen, UA: 0.2 mg/dL (ref 0.0–1.0)

## 2015-02-13 LAB — CBC
HEMATOCRIT: 41.1 % (ref 36.0–46.0)
Hemoglobin: 13.6 g/dL (ref 12.0–15.0)
MCH: 29.6 pg (ref 26.0–34.0)
MCHC: 33.1 g/dL (ref 30.0–36.0)
MCV: 89.5 fL (ref 78.0–100.0)
Platelets: 204 10*3/uL (ref 150–400)
RBC: 4.59 MIL/uL (ref 3.87–5.11)
RDW: 13 % (ref 11.5–15.5)
WBC: 6.2 10*3/uL (ref 4.0–10.5)

## 2015-02-13 LAB — APTT: aPTT: 31 seconds (ref 24–37)

## 2015-02-13 LAB — PROTIME-INR
INR: 0.98 (ref 0.00–1.49)
Prothrombin Time: 13.2 seconds (ref 11.6–15.2)

## 2015-02-13 LAB — ABO/RH: ABO/RH(D): O POS

## 2015-02-13 LAB — SURGICAL PCR SCREEN
MRSA, PCR: NEGATIVE
Staphylococcus aureus: NEGATIVE

## 2015-02-13 NOTE — Progress Notes (Signed)
Clearance- Sandra PeakNathan Figueroa PAC with LOV on chart -02/05/2015  EKG- 02/05/15 on chart

## 2015-02-21 ENCOUNTER — Encounter (HOSPITAL_COMMUNITY): Payer: Self-pay | Admitting: Anesthesiology

## 2015-02-21 ENCOUNTER — Inpatient Hospital Stay (HOSPITAL_COMMUNITY): Admission: RE | Admit: 2015-02-21 | Payer: Medicare HMO | Source: Ambulatory Visit | Admitting: Specialist

## 2015-02-21 ENCOUNTER — Encounter (HOSPITAL_COMMUNITY): Admission: RE | Payer: Self-pay | Source: Ambulatory Visit

## 2015-02-21 LAB — TYPE AND SCREEN
ABO/RH(D): O POS
ANTIBODY SCREEN: NEGATIVE

## 2015-02-21 SURGERY — ARTHROPLASTY, KNEE, TOTAL
Anesthesia: Choice | Site: Knee | Laterality: Left

## 2015-02-24 ENCOUNTER — Other Ambulatory Visit: Payer: Self-pay | Admitting: Orthopedic Surgery

## 2015-03-17 NOTE — H&P (Signed)
TOTAL KNEE ADMISSION H&P  Patient is being admitted for left total knee arthroplasty.  Subjective:  Chief Complaint:left knee pain.  HPI: [name], 74 y.o. female, has a history of pain and functional disability in the left knee due to arthritis and has failed non-surgical conservative treatments for greater than 12 weeks to includeNSAID's and/or analgesics, corticosteriod injections, flexibility and strengthening excercises, use of assistive devices, weight reduction as appropriate and activity modification.  Onset of symptoms was gradual, starting 5 years ago with gradually worsening course since that time. The patient noted prior procedures on the knee to include  arthroscopy on the left knee(s).  Patient currently rates pain in the left knee(s) at 8 out of 10 with activity. Patient has worsening of pain with activity and weight bearing, pain with passive range of motion and joint swelling.  Patient has evidence of subchondral cysts, subchondral sclerosis, periarticular osteophytes and joint space narrowing by imaging studies. This patient has had osteoarthritis. There is no active infection.  Patient Active Problem List   Diagnosis Date Noted  . Hypokalemia 10/02/2013  . Diarrhea 10/02/2013  . Diabetes mellitus (HCC) 10/02/2013  . HTN (hypertension) 10/02/2013  . Hypothyroidism 10/02/2013   Past Medical History  Diagnosis Date  . Thyroid disease   . Hypertension   . Hypothyroidism   . Diabetes mellitus without complication (HCC)     hx of 2006 no longer on meds   . Arthritis     Past Surgical History  Procedure Laterality Date  . Total hip arthroplasty      bilateral  . Cholecystectomy    . Appendectomy    . Thyroidectomy    . Tubal ligation    . Bilateral cataract surgery       No prescriptions prior to admission   Allergies  Allergen Reactions  . Oxycodone Other (See Comments)    Makes her feel "weird" and she prefers not to take it  . Penicillins Swelling     Has patient had a PCN reaction causing immediate rash, facial/tongue/throat swelling, SOB or lightheadedness with hypotension: No Has patient had a PCN reaction causing severe rash involving mucus membranes or skin necrosis: No Has patient had a PCN reaction that required hospitalization No Has patient had a PCN reaction occurring within the last 10 years: No If all of the above answers are "NO", then may proceed with Cephalosporin use.     Social History  Substance Use Topics  . Smoking status: Never Smoker   . Smokeless tobacco: Never Used  . Alcohol Use: No    Family History  Problem Relation Age of Onset  . CAD Neg Hx   . Diabetes Mellitus II Neg Hx      Review of Systems  Constitutional: Negative.   HENT: Negative.   Eyes: Negative.   Respiratory: Negative.   Cardiovascular: Negative.   Gastrointestinal: Negative.   Genitourinary: Negative.   Skin: Negative.   Neurological: Negative.   Endo/Heme/Allergies: Negative.   Psychiatric/Behavioral: Negative.     Objective:  Physical Exam  Constitutional: She is oriented to person, place, and time. She appears well-developed.  HENT:  Head: Normocephalic.  Eyes: EOM are normal.  Neck: Normal range of motion.  Cardiovascular: Normal rate, normal heart sounds and intact distal pulses.   Respiratory: Effort normal and breath sounds normal.  GI: Soft.  Genitourinary:  deferred  Musculoskeletal:  LLE grossly n/v intact. Left knee pain with rom. Knee stable on exam.  Neurological: She is alert and  oriented to person, place, and time.  Skin: Skin is warm and dry.  Psychiatric: Her behavior is normal.    Vital signs in last 24 hours:    Labs:   Estimated body mass index is 24.63 kg/(m^2) as calculated from the following:   Height as of 02/13/15:  (1.6 m).   Weight as of 02/13/15: 63.05 kg (139 lb).   Imaging Review Plain radiographs demonstrate severe degenerative joint disease of the left knee(s). The  overall alignment ismild varus. The bone quality appears to be good for age and reported activity level.  Assessment/Plan:  End stage arthritis, left knee   The patient history, physical examination, clinical judgment of the provider and imaging studies are consistent with end stage degenerative joint disease of the left knee(s) and total knee arthroplasty is deemed medically necessary. The treatment options including medical management, injection therapy arthroscopy and arthroplasty were discussed at length. The risks and benefits of total knee arthroplasty were presented and reviewed. The risks due to aseptic loosening, infection, stiffness, patella tracking problems, thromboembolic complications and other imponderables were discussed. The patient acknowledged the explanation, agreed to proceed with the plan and consent was signed. Patient is being admitted for inpatient treatment for surgery, pain control, PT, OT, prophylactic antibiotics, VTE prophylaxis, progressive ambulation and ADL's and discharge planning. The patient is planning to be discharged home with home health services.  Contraindications and adverse affects of Tranexamic acid discussed in detail. Patient denies any of these at this time and understands the risks and benefits.

## 2015-03-18 ENCOUNTER — Other Ambulatory Visit: Payer: Self-pay | Admitting: Orthopedic Surgery

## 2015-03-20 ENCOUNTER — Encounter (HOSPITAL_COMMUNITY): Payer: Self-pay | Admitting: *Deleted

## 2015-03-20 ENCOUNTER — Other Ambulatory Visit: Payer: Self-pay | Admitting: Orthopedic Surgery

## 2015-03-28 ENCOUNTER — Inpatient Hospital Stay (HOSPITAL_COMMUNITY): Payer: Medicare HMO | Admitting: Anesthesiology

## 2015-03-28 ENCOUNTER — Encounter (HOSPITAL_COMMUNITY): Payer: Self-pay | Admitting: *Deleted

## 2015-03-28 ENCOUNTER — Inpatient Hospital Stay (HOSPITAL_COMMUNITY)
Admission: RE | Admit: 2015-03-28 | Discharge: 2015-03-30 | DRG: 470 | Disposition: A | Discharge status: Home Health | Payer: Medicare HMO | Source: Ambulatory Visit | Attending: Specialist | Admitting: Specialist

## 2015-03-28 ENCOUNTER — Encounter (HOSPITAL_COMMUNITY)
Admission: RE | Disposition: A | Discharge status: Home Health | Payer: Self-pay | Source: Ambulatory Visit | Attending: Specialist

## 2015-03-28 DIAGNOSIS — E039 Hypothyroidism, unspecified: Secondary | ICD-10-CM | POA: Diagnosis present

## 2015-03-28 DIAGNOSIS — Z885 Allergy status to narcotic agent status: Secondary | ICD-10-CM

## 2015-03-28 DIAGNOSIS — Z96643 Presence of artificial hip joint, bilateral: Secondary | ICD-10-CM | POA: Diagnosis present

## 2015-03-28 DIAGNOSIS — M1712 Unilateral primary osteoarthritis, left knee: Principal | ICD-10-CM | POA: Diagnosis present

## 2015-03-28 DIAGNOSIS — E119 Type 2 diabetes mellitus without complications: Secondary | ICD-10-CM | POA: Diagnosis present

## 2015-03-28 DIAGNOSIS — Z96659 Presence of unspecified artificial knee joint: Secondary | ICD-10-CM

## 2015-03-28 DIAGNOSIS — M25762 Osteophyte, left knee: Secondary | ICD-10-CM | POA: Diagnosis present

## 2015-03-28 DIAGNOSIS — I10 Essential (primary) hypertension: Secondary | ICD-10-CM | POA: Diagnosis present

## 2015-03-28 DIAGNOSIS — Z01812 Encounter for preprocedural laboratory examination: Secondary | ICD-10-CM

## 2015-03-28 DIAGNOSIS — Z880 Allergy status to penicillin: Secondary | ICD-10-CM

## 2015-03-28 DIAGNOSIS — M25562 Pain in left knee: Secondary | ICD-10-CM | POA: Diagnosis present

## 2015-03-28 HISTORY — PX: TOTAL KNEE ARTHROPLASTY: SHX125

## 2015-03-28 LAB — BASIC METABOLIC PANEL
ANION GAP: 9 (ref 5–15)
BUN: 15 mg/dL (ref 6–20)
CHLORIDE: 102 mmol/L (ref 101–111)
CO2: 28 mmol/L (ref 22–32)
Calcium: 9.2 mg/dL (ref 8.9–10.3)
Creatinine, Ser: 0.81 mg/dL (ref 0.44–1.00)
GFR calc Af Amer: >60 mL/min (ref >60)
GFR calc non Af Amer: >60 mL/min (ref >60)
Glucose, Bld: 109 mg/dL — ABNORMAL HIGH (ref 65–99)
POTASSIUM: 4.2 mmol/L (ref 3.5–5.1)
SODIUM: 139 mmol/L (ref 135–145)

## 2015-03-28 LAB — CBC
HEMATOCRIT: 36.9 % (ref 36.0–46.0)
HEMOGLOBIN: 12.4 g/dL (ref 12.0–15.0)
MCH: 29.6 pg (ref 26.0–34.0)
MCHC: 33.6 g/dL (ref 30.0–36.0)
MCV: 88.1 fL (ref 78.0–100.0)
Platelets: 165 10*3/uL (ref 150–400)
RBC: 4.19 MIL/uL (ref 3.87–5.11)
RDW: 12.9 % (ref 11.5–15.5)
WBC: 4.2 10*3/uL (ref 4.0–10.5)

## 2015-03-28 LAB — PROTIME-INR
INR: 1.04 (ref 0.00–1.49)
Prothrombin Time: 13.8 seconds (ref 11.6–15.2)

## 2015-03-28 LAB — TYPE AND SCREEN
ABO/RH(D): O POS
Antibody Screen: NEGATIVE

## 2015-03-28 LAB — APTT: aPTT: 30 seconds (ref 24–37)

## 2015-03-28 SURGERY — ARTHROPLASTY, KNEE, TOTAL
Anesthesia: Monitor Anesthesia Care | Site: Knee | Laterality: Left

## 2015-03-28 MED ORDER — DOCUSATE SODIUM 100 MG PO CAPS
100.0000 mg | ORAL_CAPSULE | Freq: Two times a day (BID) | ORAL | Status: DC
  Administered 2015-03-28 – 2015-03-30 (×4): 100 mg via ORAL

## 2015-03-28 MED ORDER — VANCOMYCIN HCL IN DEXTROSE 1-5 GM/200ML-% IV SOLN
1000.0000 mg | Freq: Once | INTRAVENOUS | Status: AC
  Administered 2015-03-28: 1000 mg via INTRAVENOUS
  Filled 2015-03-28: qty 200

## 2015-03-28 MED ORDER — ONDANSETRON HCL 4 MG/2ML IJ SOLN
INTRAMUSCULAR | Status: AC
  Filled 2015-03-28: qty 2

## 2015-03-28 MED ORDER — SODIUM CHLORIDE 0.9 % IJ SOLN
INTRAMUSCULAR | Status: AC
  Filled 2015-03-28: qty 50

## 2015-03-28 MED ORDER — PROPOFOL 500 MG/50ML IV EMUL
INTRAVENOUS | Status: DC | PRN
Start: 2015-03-28 — End: 2015-03-28
  Administered 2015-03-28: 75 ug/kg/min via INTRAVENOUS

## 2015-03-28 MED ORDER — FENTANYL CITRATE (PF) 100 MCG/2ML IJ SOLN: 25.0000 ug | INTRAMUSCULAR | Status: DC | PRN

## 2015-03-28 MED ORDER — HYDROCODONE-ACETAMINOPHEN 7.5-325 MG PO TABS
1.0000 | ORAL_TABLET | ORAL | Status: DC
  Administered 2015-03-28 – 2015-03-30 (×7): 1 via ORAL
  Administered 2015-03-30: 2 via ORAL
  Administered 2015-03-30: 1 via ORAL
  Filled 2015-03-28: qty 1
  Filled 2015-03-28 (×3): qty 2
  Filled 2015-03-28 (×2): qty 1
  Filled 2015-03-28: qty 2
  Filled 2015-03-28 (×3): qty 1

## 2015-03-28 MED ORDER — ROCURONIUM BROMIDE 100 MG/10ML IV SOLN
INTRAVENOUS | Status: AC
  Filled 2015-03-28: qty 1

## 2015-03-28 MED ORDER — FUROSEMIDE 20 MG PO TABS
20.0000 mg | ORAL_TABLET | Freq: Every day | ORAL | Status: DC | PRN
  Filled 2015-03-28: qty 1

## 2015-03-28 MED ORDER — DEXAMETHASONE SODIUM PHOSPHATE 10 MG/ML IJ SOLN
INTRAMUSCULAR | Status: AC
  Filled 2015-03-28: qty 1

## 2015-03-28 MED ORDER — DEXAMETHASONE SODIUM PHOSPHATE 10 MG/ML IJ SOLN
10.0000 mg | Freq: Once | INTRAMUSCULAR | Status: AC
  Administered 2015-03-28: 10 mg via INTRAVENOUS

## 2015-03-28 MED ORDER — SODIUM CHLORIDE 0.9 % IJ SOLN
INTRAMUSCULAR | Status: DC | PRN
  Administered 2015-03-28: 20 mL

## 2015-03-28 MED ORDER — METHOCARBAMOL 500 MG PO TABS
500.0000 mg | ORAL_TABLET | Freq: Four times a day (QID) | ORAL | Status: DC | PRN
  Filled 2015-03-28: qty 1

## 2015-03-28 MED ORDER — PROPOFOL 10 MG/ML IV BOLUS
INTRAVENOUS | Status: AC
  Filled 2015-03-28: qty 40

## 2015-03-28 MED ORDER — EPHEDRINE SULFATE 50 MG/ML IJ SOLN
INTRAMUSCULAR | Status: DC | PRN
  Administered 2015-03-28: 5 mg via INTRAVENOUS
  Administered 2015-03-28: 10 mg via INTRAVENOUS

## 2015-03-28 MED ORDER — MIDAZOLAM HCL 5 MG/5ML IJ SOLN
INTRAMUSCULAR | Status: DC | PRN
  Administered 2015-03-28: 0.5 mg via INTRAVENOUS

## 2015-03-28 MED ORDER — PROPOFOL 10 MG/ML IV BOLUS
INTRAVENOUS | Status: AC
  Filled 2015-03-28: qty 20

## 2015-03-28 MED ORDER — LIDOCAINE HCL (CARDIAC) 20 MG/ML IV SOLN
INTRAVENOUS | Status: AC
  Filled 2015-03-28: qty 5

## 2015-03-28 MED ORDER — FENTANYL CITRATE (PF) 100 MCG/2ML IJ SOLN
INTRAMUSCULAR | Status: AC
  Filled 2015-03-28: qty 2

## 2015-03-28 MED ORDER — ENOXAPARIN SODIUM 30 MG/0.3ML ~~LOC~~ SOLN
30.0000 mg | Freq: Two times a day (BID) | SUBCUTANEOUS | Status: DC
  Administered 2015-03-29 – 2015-03-30 (×3): 30 mg via SUBCUTANEOUS
  Filled 2015-03-28 (×5): qty 0.3

## 2015-03-28 MED ORDER — ACETAMINOPHEN 10 MG/ML IV SOLN
INTRAVENOUS | Status: AC
  Filled 2015-03-28: qty 100

## 2015-03-28 MED ORDER — PROPOFOL 10 MG/ML IV BOLUS
INTRAVENOUS | Status: DC | PRN
  Administered 2015-03-28: 20 mg via INTRAVENOUS

## 2015-03-28 MED ORDER — PHENOL 1.4 % MT LIQD
1.0000 | OROMUCOSAL | Status: DC | PRN
  Filled 2015-03-28: qty 177

## 2015-03-28 MED ORDER — DEXAMETHASONE SODIUM PHOSPHATE 10 MG/ML IJ SOLN
10.0000 mg | Freq: Once | INTRAMUSCULAR | Status: AC
  Administered 2015-03-29: 10 mg via INTRAVENOUS
  Filled 2015-03-28: qty 1

## 2015-03-28 MED ORDER — POLYETHYLENE GLYCOL 3350 17 G PO PACK
17.0000 g | PACK | Freq: Two times a day (BID) | ORAL | Status: DC
  Administered 2015-03-29 – 2015-03-30 (×2): 17 g via ORAL

## 2015-03-28 MED ORDER — GLYCOPYRROLATE 0.2 MG/ML IJ SOLN
INTRAMUSCULAR | Status: AC
  Filled 2015-03-28: qty 1

## 2015-03-28 MED ORDER — ALUM & MAG HYDROXIDE-SIMETH 200-200-20 MG/5ML PO SUSP: 30.0000 mL | ORAL | Status: DC | PRN

## 2015-03-28 MED ORDER — LISINOPRIL 10 MG PO TABS
10.0000 mg | ORAL_TABLET | Freq: Every day | ORAL | Status: DC
  Administered 2015-03-30: 10 mg via ORAL
  Filled 2015-03-28 (×3): qty 1

## 2015-03-28 MED ORDER — MIDAZOLAM HCL 2 MG/2ML IJ SOLN
INTRAMUSCULAR | Status: AC
  Filled 2015-03-28: qty 2

## 2015-03-28 MED ORDER — ACETAMINOPHEN 10 MG/ML IV SOLN
1000.0000 mg | Freq: Once | INTRAVENOUS | Status: AC
  Administered 2015-03-28: 1000 mg via INTRAVENOUS

## 2015-03-28 MED ORDER — LISINOPRIL 10 MG PO TABS
10.0000 mg | ORAL_TABLET | Freq: Every day | ORAL | Status: DC
  Filled 2015-03-28 (×2): qty 1

## 2015-03-28 MED ORDER — TRANEXAMIC ACID 1000 MG/10ML IV SOLN
1000.0000 mg | INTRAVENOUS | Status: AC
  Administered 2015-03-28: 1000 mg via INTRAVENOUS
  Filled 2015-03-28: qty 10

## 2015-03-28 MED ORDER — KETOROLAC TROMETHAMINE 30 MG/ML IJ SOLN
INTRAMUSCULAR | Status: DC | PRN
  Administered 2015-03-28: 30 mg

## 2015-03-28 MED ORDER — SODIUM CHLORIDE 0.9 % IR SOLN
Status: DC | PRN
  Administered 2015-03-28: 1

## 2015-03-28 MED ORDER — VANCOMYCIN HCL IN DEXTROSE 1-5 GM/200ML-% IV SOLN
1000.0000 mg | INTRAVENOUS | Status: AC
  Administered 2015-03-28 (×2): 1000 mg via INTRAVENOUS
  Filled 2015-03-28: qty 200

## 2015-03-28 MED ORDER — ONDANSETRON HCL 4 MG/2ML IJ SOLN: 4.0000 mg | Freq: Four times a day (QID) | INTRAMUSCULAR | Status: DC | PRN

## 2015-03-28 MED ORDER — SODIUM CHLORIDE 0.9 % IV SOLN
INTRAVENOUS | Status: DC
  Administered 2015-03-28: 17:00:00 via INTRAVENOUS
  Filled 2015-03-28 (×6): qty 1000

## 2015-03-28 MED ORDER — LEVOTHYROXINE SODIUM 100 MCG PO TABS
100.0000 ug | ORAL_TABLET | Freq: Every day | ORAL | Status: DC
  Administered 2015-03-29 – 2015-03-30 (×2): 100 ug via ORAL
  Filled 2015-03-28 (×3): qty 1

## 2015-03-28 MED ORDER — FENTANYL CITRATE (PF) 100 MCG/2ML IJ SOLN
INTRAMUSCULAR | Status: DC | PRN
  Administered 2015-03-28: 50 ug via INTRAVENOUS

## 2015-03-28 MED ORDER — HYDROMORPHONE HCL 1 MG/ML IJ SOLN: 0.2000 mg | INTRAMUSCULAR | Status: DC | PRN

## 2015-03-28 MED ORDER — METHOCARBAMOL 1000 MG/10ML IJ SOLN
500.0000 mg | Freq: Four times a day (QID) | INTRAVENOUS | Status: DC | PRN
  Administered 2015-03-28: 500 mg via INTRAVENOUS
  Filled 2015-03-28 (×2): qty 5

## 2015-03-28 MED ORDER — GLYCOPYRROLATE 0.2 MG/ML IJ SOLN
INTRAMUSCULAR | Status: DC | PRN
  Administered 2015-03-28: 0.2 mg via INTRAVENOUS

## 2015-03-28 MED ORDER — MENTHOL 3 MG MT LOZG
1.0000 | LOZENGE | OROMUCOSAL | Status: DC | PRN
Start: 2015-03-28 — End: 2015-03-30

## 2015-03-28 MED ORDER — PROPOFOL 10 MG/ML IV BOLUS
INTRAVENOUS | Status: AC
  Filled 2015-03-28: qty 60

## 2015-03-28 MED ORDER — LACTATED RINGERS IV SOLN
INTRAVENOUS | Status: DC
  Administered 2015-03-28 (×3): via INTRAVENOUS

## 2015-03-28 MED ORDER — EPHEDRINE SULFATE 50 MG/ML IJ SOLN
INTRAMUSCULAR | Status: AC
  Filled 2015-03-28: qty 1

## 2015-03-28 MED ORDER — DIPHENHYDRAMINE HCL 12.5 MG/5ML PO ELIX: 25.0000 mg | ORAL_SOLUTION | Freq: Four times a day (QID) | ORAL | Status: DC | PRN

## 2015-03-28 MED ORDER — ONDANSETRON HCL 4 MG PO TABS: 4.0000 mg | ORAL_TABLET | Freq: Four times a day (QID) | ORAL | Status: DC | PRN

## 2015-03-28 MED ORDER — FENTANYL CITRATE (PF) 250 MCG/5ML IJ SOLN
INTRAMUSCULAR | Status: AC
  Filled 2015-03-28: qty 5

## 2015-03-28 MED ORDER — ONDANSETRON HCL 4 MG/2ML IJ SOLN
4.0000 mg | Freq: Four times a day (QID) | INTRAMUSCULAR | Status: DC | PRN
  Administered 2015-03-29: 4 mg via INTRAVENOUS
  Filled 2015-03-28: qty 2

## 2015-03-28 MED ORDER — BISACODYL 5 MG PO TBEC: 5.0000 mg | DELAYED_RELEASE_TABLET | Freq: Every day | ORAL | Status: DC | PRN

## 2015-03-28 MED ORDER — FERROUS SULFATE 325 (65 FE) MG PO TABS
325.0000 mg | ORAL_TABLET | Freq: Three times a day (TID) | ORAL | Status: DC
  Administered 2015-03-28 – 2015-03-30 (×5): 325 mg via ORAL
  Filled 2015-03-28 (×8): qty 1

## 2015-03-28 MED ORDER — KETOROLAC TROMETHAMINE 30 MG/ML IJ SOLN
INTRAMUSCULAR | Status: AC
  Filled 2015-03-28: qty 1

## 2015-03-28 MED ORDER — BUPIVACAINE-EPINEPHRINE 0.25% -1:200000 IJ SOLN
INTRAMUSCULAR | Status: DC | PRN
  Administered 2015-03-28: 30 mg

## 2015-03-28 MED ORDER — ONDANSETRON HCL 4 MG/2ML IJ SOLN
INTRAMUSCULAR | Status: DC | PRN
  Administered 2015-03-28: 4 mg via INTRAVENOUS

## 2015-03-28 MED ORDER — BUPIVACAINE-EPINEPHRINE (PF) 0.25% -1:200000 IJ SOLN
INTRAMUSCULAR | Status: AC
  Filled 2015-03-28: qty 30

## 2015-03-28 MED ORDER — TRANEXAMIC ACID 1000 MG/10ML IV SOLN: 1000.0000 mg | INTRAVENOUS | Status: DC

## 2015-03-28 MED ORDER — DEXAMETHASONE SODIUM PHOSPHATE 10 MG/ML IJ SOLN: 10.0000 mg | Freq: Once | INTRAMUSCULAR | Status: DC

## 2015-03-28 SURGICAL SUPPLY — 58 items
BAG DECANTER FOR FLEXI CONT (MISCELLANEOUS) IMPLANT
BAG SPEC THK2 15X12 ZIP CLS (MISCELLANEOUS) ×2
BAG ZIPLOCK 12X15 (MISCELLANEOUS) ×6 IMPLANT
BANDAGE ELASTIC 4 VELCRO ST LF (GAUZE/BANDAGES/DRESSINGS) ×3 IMPLANT
BANDAGE ELASTIC 6 VELCRO ST LF (GAUZE/BANDAGES/DRESSINGS) ×3 IMPLANT
BLADE SAG 18X100X1.27 (BLADE) ×3 IMPLANT
BLADE SAW SGTL 13.0X1.19X90.0M (BLADE) ×3 IMPLANT
CAP KNEE TOTAL 3 SIGMA ×3 IMPLANT
CEMENT HV SMART SET (Cement) ×6 IMPLANT
CLOTH BEACON ORANGE TIMEOUT ST (SAFETY) ×3 IMPLANT
CUFF TOURN SGL QUICK 34 (TOURNIQUET CUFF) ×3
CUFF TRNQT CYL 34X4X40X1 (TOURNIQUET CUFF) ×1 IMPLANT
DECANTER SPIKE VIAL GLASS SM (MISCELLANEOUS) ×3 IMPLANT
DRAPE U-SHAPE 47X51 STRL (DRAPES) ×3 IMPLANT
DRSG AQUACEL AG ADV 3.5X10 (GAUZE/BANDAGES/DRESSINGS) ×3 IMPLANT
DRSG TEGADERM 4X4.75 (GAUZE/BANDAGES/DRESSINGS) ×3 IMPLANT
DURAPREP 26ML APPLICATOR (WOUND CARE) ×6 IMPLANT
ELECT REM PT RETURN 9FT ADLT (ELECTROSURGICAL) ×3
ELECTRODE REM PT RTRN 9FT ADLT (ELECTROSURGICAL) ×1 IMPLANT
EVACUATOR 1/8 PVC DRAIN (DRAIN) ×3 IMPLANT
GAUZE SPONGE 2X2 8PLY STRL LF (GAUZE/BANDAGES/DRESSINGS) ×1 IMPLANT
GLOVE BIOGEL PI IND STRL 8 (GLOVE) ×2 IMPLANT
GLOVE BIOGEL PI INDICATOR 8 (GLOVE) ×4
GLOVE SURG ORTHO 8.0 STRL STRW (GLOVE) ×3 IMPLANT
GLOVE SURG ORTHO 9.0 STRL STRW (GLOVE) ×3 IMPLANT
GLOVE SURG SS PI 7.5 STRL IVOR (GLOVE) ×3 IMPLANT
GOWN STRL REUS W/TWL XL LVL3 (GOWN DISPOSABLE) ×6 IMPLANT
HANDPIECE INTERPULSE COAX TIP (DISPOSABLE) ×3
IMMOBILIZER KNEE 20 (SOFTGOODS) ×3
IMMOBILIZER KNEE 20 THIGH 36 (SOFTGOODS) ×1 IMPLANT
LIQUID BAND (GAUZE/BANDAGES/DRESSINGS) ×3 IMPLANT
NS IRRIG 1000ML POUR BTL (IV SOLUTION) ×3 IMPLANT
PACK TOTAL KNEE CUSTOM (KITS) ×3 IMPLANT
POSITIONER SURGICAL ARM (MISCELLANEOUS) ×3 IMPLANT
SET HNDPC FAN SPRY TIP SCT (DISPOSABLE) ×1 IMPLANT
SET PAD KNEE POSITIONER (MISCELLANEOUS) ×3 IMPLANT
SPONGE GAUZE 2X2 STER 10/PKG (GAUZE/BANDAGES/DRESSINGS) ×2
SPONGE LAP 18X18 X RAY DECT (DISPOSABLE) IMPLANT
SPONGE SURGIFOAM ABS GEL 100 (HEMOSTASIS) ×3 IMPLANT
STOCKINETTE 6  STRL (DRAPES) ×2
STOCKINETTE 6 STRL (DRAPES) ×1 IMPLANT
SUCTION FRAZIER 12FR DISP (SUCTIONS) ×3 IMPLANT
SUT BONE WAX W31G (SUTURE) IMPLANT
SUT MNCRL AB 3-0 PS2 18 (SUTURE) ×3 IMPLANT
SUT VIC AB 0 CT1 36 (SUTURE) ×3 IMPLANT
SUT VIC AB 1 CT1 27 (SUTURE) ×12
SUT VIC AB 1 CT1 27XBRD ANTBC (SUTURE) ×4 IMPLANT
SUT VIC AB 2-0 CT1 27 (SUTURE) ×6
SUT VIC AB 2-0 CT1 TAPERPNT 27 (SUTURE) ×2 IMPLANT
SUT VLOC 180 0 24IN GS25 (SUTURE) ×3 IMPLANT
SYR 50ML LL SCALE MARK (SYRINGE) ×3 IMPLANT
TAPE STRIPS DRAPE STRL (GAUZE/BANDAGES/DRESSINGS) ×3 IMPLANT
TOWER CARTRIDGE SMART MIX (DISPOSABLE) ×3 IMPLANT
TRAY FOLEY W/METER SILVER 14FR (SET/KITS/TRAYS/PACK) ×3 IMPLANT
TRAY FOLEY W/METER SILVER 16FR (SET/KITS/TRAYS/PACK) ×3 IMPLANT
WATER STERILE IRR 1500ML POUR (IV SOLUTION) ×6 IMPLANT
WRAP KNEE MAXI GEL POST OP (GAUZE/BANDAGES/DRESSINGS) ×3 IMPLANT
YANKAUER SUCT BULB TIP 10FT TU (MISCELLANEOUS) ×3 IMPLANT

## 2015-03-28 NOTE — Anesthesia Postprocedure Evaluation (Signed)
Anesthesia Post Note  Patient: Sandra McalpineShirley J Figueroa  Procedure(s) Performed: Procedure(s) (LRB): LEFT TOTAL KNEE ARTHROPLASTY (Left)  Patient location during evaluation: PACU Anesthesia Type: Spinal Level of consciousness: awake and alert and oriented Pain management: pain level controlled Vital Signs Assessment: post-procedure vital signs reviewed and stable Respiratory status: spontaneous breathing, nonlabored ventilation and respiratory function stable Anesthetic complications: no    Last Vitals:  Filed Vitals:   03/28/15 1315 03/28/15 1330  BP: 113/56 122/49  Pulse: 55 58  Temp:    Resp: 15 19    Last Pain:  Filed Vitals:   03/28/15 1335  PainSc: 0-No pain                 Henley Boettner S

## 2015-03-28 NOTE — Op Note (Addendum)
DATE OF SURGERY:  03/28/2015  TIME: 12:54 PM  PATIENT NAME:  [name]    AGE: 74 y.o.   PRE-OPERATIVE DIAGNOSIS:  LEFT KNEE OSTEOARTHRITIS  POST-OPERATIVE DIAGNOSIS:  LEFT KNEE OSTEOARTHRITIS  PROCEDURE:  Procedure(s): LEFT TOTAL KNEE ARTHROPLASTY  SURGEON:  Kiing Deakin ANDREW  ASSISTANT:  Bryson Stilwell, PA-C, present and scrubbed throughout the case, critical for assistance with exposure, retraction, instrumentation, and closure.  OPERATIVE IMPLANTS: Depuy PFC Sigma Rotating Platform.  Femur size 3, Tibia size 3, Patella size 32 3-peg oval button, with a 12.5 mm polyethylene insert.   PREOPERATIVE INDICATIONS:   MURL ZOGG is a 74 y.o. year old female with end stage bone on bone arthritis of the knee who failed conservative treatment and elected for Total Knee Arthroplasty.   The risks, benefits, and alternatives were discussed at length including but not limited to the risks of infection, bleeding, nerve injury, stiffness, blood clots, the need for revision surgery, cardiopulmonary complications, among others, and they were willing to proceed.  OPERATIVE DESCRIPTION:  The patient was brought to the operative room and placed in a supine position.  Spinal anesthesia was administered.  IV antibiotics were given.  The lower extremity was prepped and draped in the usual sterile fashion.  Time out was performed.  The leg was elevated and exsanguinated and the tourniquet was inflated.  Anterior quadriceps tendon splitting approach was performed.  The patella was retracted and osteophytes were removed.  The anterior horn of the medial and lateral meniscus was removed and cruciate ligaments resected.   The distal femur was opened with the drill and the intramedullary distal femoral cutting jig was utilized, set at 5 degrees resecting 10 mm off the distal femur.  Care was taken to protect the collateral ligaments.  The distal femoral sizing jig was applied, taking  care to avoid notching.  Then the 4-in-1 cutting jig was applied and the anterior and posterior femur was cut, along with the chamfer cuts.    Then the extramedullary tibial cutting jig was utilized making the appropriate cut using the anterior tibial crest as a reference building in appropriate posterior slope.  Care was taken during the cut to protect the medial and collateral ligaments.  The proximal tibia was removed along with the posterior horns of the menisci.   The posterior medial femoral osteophytes and posterior lateral femoral osteophytes were removed.    The flexion gap was then measured and was symmetric with the extension gap, measured at 12.  I completed the distal femoral preparation using the appropriate jig to prepare the box.  The patella was then measured, and cut with the saw.    The proximal tibia sized and prepared accordingly with the reamer and the punch, and then all components were trialed with the trial insert.  The knee was found to have excellent balance and full motion.    The above named components were then cemented into place and all excess cement was removed.  The trial polyethylene component was in place during cementation, and then was exchanged for the real polyethylene component.    The knee was easily taken through a range of motion and the patella tracked well and the knee irrigated copiously and the parapatellar and subcutaneous tissue closed with vicryl, and monocryl with steri strips for the skin.  The arthrotomy was closed at 90 of flexion. The wounds were dressed with sterile gauze and the tourniquet released and the patient was awakened and returned to the PACU  in stable and satisfactory condition.  There were no complications.  Total tourniquet time was 90 minutes.         Assistant was Mr Iantha FallenSteve Chabon,PAC

## 2015-03-28 NOTE — Progress Notes (Signed)
Utilization review completed.  

## 2015-03-28 NOTE — Anesthesia Preprocedure Evaluation (Addendum)
Anesthesia Evaluation  Patient identified by MRN, date of birth, ID band Patient awake    Reviewed: Allergy & Precautions, NPO status , Patient's Chart, lab work & pertinent test results  Airway Mallampati: II   Neck ROM: full    Dental   Pulmonary neg pulmonary ROS,    breath sounds clear to auscultation       Cardiovascular hypertension,  Rhythm:regular Rate:Normal     Neuro/Psych    GI/Hepatic   Endo/Other  diabetes, Type 2Hypothyroidism   Renal/GU      Musculoskeletal  (+) Arthritis ,   Abdominal   Peds  Hematology   Anesthesia Other Findings   Reproductive/Obstetrics                           Anesthesia Physical Anesthesia Plan  ASA: II  Anesthesia Plan: MAC and Spinal   Post-op Pain Management:    Induction: Intravenous  Airway Management Planned: Simple Face Mask  Additional Equipment:   Intra-op Plan:   Post-operative Plan:   Informed Consent: I have reviewed the patients History and Physical, chart, labs and discussed the procedure including the risks, benefits and alternatives for the proposed anesthesia with the patient or authorized representative who has indicated his/her understanding and acceptance.     Plan Discussed with: CRNA, Anesthesiologist and Surgeon  Anesthesia Plan Comments:       Anesthesia Quick Evaluation

## 2015-03-28 NOTE — Interval H&P Note (Signed)
History and Physical Interval Note:  03/28/2015 7:39 AM  [name]  has presented today for surgery, with the diagnosis of LEFT KNEE OSTEOARTHRITIS  The various methods of treatment have been discussed with the patient and family. After consideration of risks, benefits and other options for treatment, the patient has consented to  Procedure(s): LEFT TOTAL KNEE ARTHROPLASTY (Left) as a surgical intervention .  The patient's history has been reviewed, patient examined, no change in status, stable for surgery.  I have reviewed the patient's chart and labs.  Questions were answered to the patient's satisfaction.     Tron Flythe ANDREW

## 2015-03-28 NOTE — Transfer of Care (Signed)
Immediate Anesthesia Transfer of Care Note  Patient: Sandra McalpineShirley J Hilbun  Procedure(s) Performed: Procedure(s): LEFT TOTAL KNEE ARTHROPLASTY (Left)  Patient Location: PACU  Anesthesia Type:Spinal  Level of Consciousness:  sedated, patient cooperative and responds to stimulation  Airway & Oxygen Therapy:Patient Spontanous Breathing and Patient connected to face mask oxgen  Post-op Assessment:  Report given to PACU RN and Post -op Vital signs reviewed and stable  Post vital signs:  Reviewed and stable  Last Vitals:  Filed Vitals:   03/28/15 0735  BP: 147/59  Pulse: 53  Temp: 36.5 C  Resp: 20    Complications: No apparent anesthesia complications, S1 level on exam in pacu patient denied pain on assessment.

## 2015-03-28 NOTE — Anesthesia Procedure Notes (Signed)
Spinal Patient location during procedure: OR End time: 03/28/2015 11:04 AM Staffing Performed by: anesthesiologist  Preanesthetic Checklist Completed: patient identified, site marked, surgical consent, pre-op evaluation, timeout performed, IV checked, risks and benefits discussed and monitors and equipment checked Spinal Block Patient position: sitting Prep: Betadine Patient monitoring: heart rate, continuous pulse ox and blood pressure Location: L2-3 Injection technique: single-shot Needle Needle type: Spinocan  Needle gauge: 22 G Needle length: 9 cm Assessment Sensory level: T4 Additional Notes Expiration date of kit checked and confirmed. Patient tolerated procedure well, without complications.

## 2015-03-29 LAB — CBC
HEMATOCRIT: 28.2 % — AB (ref 36.0–46.0)
HEMOGLOBIN: 9.9 g/dL — AB (ref 12.0–15.0)
MCH: 30.4 pg (ref 26.0–34.0)
MCHC: 35.1 g/dL (ref 30.0–36.0)
MCV: 86.5 fL (ref 78.0–100.0)
PLATELETS: 135 10*3/uL — AB (ref 150–400)
RBC: 3.26 MIL/uL — AB (ref 3.87–5.11)
RDW: 12.6 % (ref 11.5–15.5)
WBC: 9.3 10*3/uL (ref 4.0–10.5)

## 2015-03-29 LAB — BASIC METABOLIC PANEL
ANION GAP: 6 (ref 5–15)
BUN: 18 mg/dL (ref 6–20)
CHLORIDE: 99 mmol/L — AB (ref 101–111)
CO2: 25 mmol/L (ref 22–32)
Calcium: 8.6 mg/dL — ABNORMAL LOW (ref 8.9–10.3)
Creatinine, Ser: 0.81 mg/dL (ref 0.44–1.00)
GFR calc Af Amer: >60 mL/min (ref >60)
GLUCOSE: 156 mg/dL — AB (ref 65–99)
POTASSIUM: 4.6 mmol/L (ref 3.5–5.1)
Sodium: 130 mmol/L — ABNORMAL LOW (ref 135–145)

## 2015-03-29 MED ORDER — PROMETHAZINE HCL 25 MG PO TABS: 25.0000 mg | ORAL_TABLET | Freq: Four times a day (QID) | ORAL | Status: DC | PRN

## 2015-03-29 MED ORDER — HYDROCODONE-ACETAMINOPHEN 7.5-325 MG PO TABS: 1.0000 | ORAL_TABLET | ORAL | Status: DC

## 2015-03-29 MED ORDER — PROMETHAZINE HCL 25 MG/ML IJ SOLN
6.2500 mg | Freq: Four times a day (QID) | INTRAMUSCULAR | Status: DC | PRN
  Administered 2015-03-29: 6.25 mg via INTRAVENOUS
  Filled 2015-03-29: qty 1

## 2015-03-29 MED ORDER — METHOCARBAMOL 500 MG PO TABS: 500.0000 mg | ORAL_TABLET | Freq: Three times a day (TID) | ORAL | Status: DC | PRN

## 2015-03-29 NOTE — Care Management Note (Addendum)
Case Management Note  Patient Details  Name: [name] MRN: [id number] Date of Birth: [birth year]  Subjective/Objective:                  LEFT TOTAL KNEE ARTHROPLASTY   Action/Plan: CM attempted to speak with the patient at the bedside this morning. She was asleep. Contacted patient via telephone. [facility] States she has used [facility] in the past. [facility]. She has a RW and bedside commode at home.   Expected Discharge Date:  03/30/15               Expected Discharge Plan:  Home w Home Health Services  In-House Referral:     Discharge planning Services  CM Consult  Post Acute Care Choice:  Home Health Choice offered to:  Patient  DME Arranged:  N/A DME Agency:  NA  HH Arranged:  PT HH Agency:  [facility]  Status of Service:  Completed, signed off  Medicare Important Message Given:    Date Medicare IM Given:    Medicare IM give by:    Date Additional Medicare IM Given:    Additional Medicare Important Message give by:     If discussed at Long Length of Stay Meetings, dates discussed:    Additional Comments:  [name], Sandra Cataldi Harris, RN 03/29/2015, 3:33 PM

## 2015-03-29 NOTE — Progress Notes (Addendum)
Occupational Therapy Evaluation Patient Details Name: [name] Demartin MRN: [id number] DOB: 1940/04/29 Today's Date: 03/29/2015    History of Present Illness s/p L TKA; PMHx: bil THA   Clinical Impression   Patient evaluated by Occupational Therapy with no further acute OT needs identified. All education has been completed and the patient has no further questions. Pt is able to perform all ADLs at min guard assist level.  All education completed.  Pt with N/V during eval - RN aware.  See below for any follow-up Occupational Therapy or equipment needs. OT is signing off. Thank you for this referral.      Follow Up Recommendations  No OT follow up;Supervision/Assistance - 24 hour    Equipment Recommendations  None recommended by OT    Recommendations for Other Services       Precautions / Restrictions Precautions Precautions: Fall;Knee Required Braces or Orthoses: Knee Immobilizer - Left Knee Immobilizer - Left: Discontinue once straight leg raise with < 10 degree lag Restrictions Weight Bearing Restrictions: No Other Position/Activity Restrictions: WBAT      Mobility Bed Mobility Overal bed mobility: Needs Assistance Bed Mobility: Sit to Supine     Supine to sit: Min assist Sit to supine: Supervision   General bed mobility comments: assist with L LE and lateral scooting, cues for technique and use of UEs to self assist  Transfers Overall transfer level: Needs assistance Equipment used: Rolling walker (2 wheeled) Transfers: Sit to/from UGI CorporationStand;Stand Pivot Transfers Sit to Stand: Min guard Stand pivot transfers: Min guard           Balance Overall balance assessment: Needs assistance           Standing balance-Leahy Scale: Poor                              ADL                                         General ADL Comments: Pt requires min guard assist with LB ADLs, toilet transfers and walk in shower transfers.  She  demonstrates good safety awareness      Vision     Perception     Praxis      Pertinent Vitals/Pain Pain Assessment: No/denies pain     Hand Dominance     Extremity/Trunk Assessment Upper Extremity Assessment Upper Extremity Assessment: Overall WFL for tasks assessed      Cervical / Trunk Assessment Cervical / Trunk Assessment: Normal   Communication Communication Communication: No difficulties   Cognition Arousal/Alertness: Awake/alert;Lethargic Behavior During Therapy: WFL for tasks assessed/performed Overall Cognitive Status: Within Functional Limits for tasks assessed                     General Comments       Exercises       Shoulder Instructions      Home Living Family/patient expects to be discharged to:: Private residence Living Arrangements: Alone Available Help at Discharge: Family;Available 24 hours/day Type of Home: Mobile home Home Access: Stairs to enter Entrance Stairs-Number of Steps: 4 Entrance Stairs-Rails: Right;Left Home Layout: One level     Bathroom Shower/Tub: Producer, television/film/videoWalk-in shower   Bathroom Toilet: Handicapped height Bathroom Accessibility: Yes How Accessible: Accessible via walker Home Equipment: Walker - 2 wheels;Bedside commode;Grab bars - tub/shower;Grab bars - toilet  Prior Functioning/Environment Level of Independence: Independent             OT Diagnosis: Generalized weakness   OT Problem List:     OT Treatment/Interventions:      OT Goals(Current goals can be found in the care plan section) Acute Rehab OT Goals Patient Stated Goal: to feel better  OT Goal Formulation: All assessment and education complete, DC therapy  OT Frequency:     Barriers to D/C:            Co-evaluation              End of Session Equipment Utilized During Treatment: Rolling walker;Left knee immobilizer Nurse Communication: Mobility status  Activity Tolerance: Patient tolerated treatment well Patient  left: in bed;with call bell/phone within reach;with family/visitor present   Time: 1610-9604  OT Time Calculation (min): 25 min Charges:  OT General Charges $OT Visit: 1 Procedure OT Evaluation $Initial OT Evaluation Tier I: 1 Procedure OT Treatments $Self Care/Home Management : 8-22 mins G-Codes:    Arrianna Catala M 04/22/2015, 4:54 PM

## 2015-03-29 NOTE — Progress Notes (Signed)
   03/29/15 1500  PT Visit Information  Last PT Received On 03/29/15  Assistance Needed +1  History of Present Illness s/p L TKA; PMHx: bil THA  PT Time Calculation  PT Start Time (ACUTE ONLY) 1526  PT Stop Time (ACUTE ONLY) 1536  PT Time Calculation (min) (ACUTE ONLY) 10 min  Precautions  Precautions Fall;Knee  Required Braces or Orthoses Knee Immobilizer - Left  Knee Immobilizer - Left Discontinue once straight leg raise with < 10 degree lag  Restrictions  Other Position/Activity Restrictions WBAT  Pain Assessment  Pain Assessment No/denies pain  Cognition  Arousal/Alertness Lethargic;Suspect due to medications (nausea meds)  Behavior During Therapy [facility]FL for tasks assessed/performed  Overall Cognitive Status Within Functional Limits for tasks assessed  Bed Mobility  Overal bed mobility Needs Assistance  Total Joint Exercises  Ankle Circles/Pumps AROM;Both;10 reps  Quad Sets Strengthening;Both;10 reps  Heel Slides AAROM;Left;10 reps;PROM;Limitations  Hip ABduction/ADduction AAROM;Left;10 reps  Straight Leg Raises AAROM;Left;10 reps  Heel Slides Limitations pt falling asleep, requiring cues to stay awake to participate with ex's  PT - End of Session  Activity Tolerance Patient limited by lethargy  Patient left in bed;with call bell/phone within reach;with family/visitor present  PT - Assessment/Plan  PT Plan Current plan remains appropriate  PT Frequency (ACUTE ONLY) 7X/week  Follow Up Recommendations Home health PT;Supervision for mobility/OOB  PT equipment Rolling walker with 5" wheels;None recommended by PT  PT Goal Progression  Progress towards PT goals Progressing toward goals  Acute Rehab PT Goals  PT Goal Formulation With patient  Time For Goal Achievement 04/02/15  Potential to Achieve Goals Good  PT General Charges  $$ ACUTE PT VISIT 1 Procedure  PT Treatments  $Therapeutic Exercise 8-22 mins

## 2015-03-29 NOTE — Progress Notes (Signed)
   Subjective: 1 Day Post-Op Procedure(s) (LRB): LEFT TOTAL KNEE ARTHROPLASTY (Left) Patient reports pain as mild.   Patient seen in rounds with Dr. Darrelyn HillockGioffre Patient is well, but has had some minor complaints of nausea/vomiting. No issues overnight. No SOB or chest pain. Plan is to go Home after hospital stay.  Objective: Vital signs in last 24 hours: Temp:  [97 F (36.1 C)-98 F (36.7 C)] 97 F (36.1 C) (12/17 0610) Pulse Rate:  [40-58] 44 (12/17 0800) Resp:  [14-19] 18 (12/17 0800) BP: (113-152)/(46-72) 129/65 mmHg (12/17 0800) SpO2:  [99 %-100 %] 100 % (12/17 0800)  Intake/Output from previous day:  Intake/Output Summary (Last 24 hours) at 03/29/15 0914 Last data filed at 03/29/15 [id number]  Gross per 24 hour  Intake 4711.67 ml  Output   1420 ml  Net 3291.67 ml    Intake/Output this shift: Total I/O In: 240 [P.O.:240] Out: 100 [Urine:100]  Labs:  Recent Labs  03/28/15 0820 03/29/15 0434  HGB 12.4 9.9*    Recent Labs  03/28/15 0820 03/29/15 0434  WBC 4.2 9.3  RBC 4.19 3.26*  HCT 36.9 28.2*  PLT 165 135*    Recent Labs  03/28/15 0820 03/29/15 0434  NA 139 130*  K 4.2 4.6  CL 102 99*  CO2 28 25  BUN 15 18  CREATININE 0.81 0.81  GLUCOSE 109* 156*  CALCIUM 9.2 8.6*    Recent Labs  03/28/15 0820  INR 1.04    EXAM General - Patient is Alert and Oriented Extremity - Neurologically intact Intact pulses distally Dorsiflexion/Plantar flexion intact Compartment soft Dressing - dressing C/D/I Motor Function - intact, moving foot and toes well on exam.  Hemovac pulled without difficulty.  Past Medical History  Diagnosis Date  . Thyroid disease   . Hypertension   . Hypothyroidism   . Diabetes mellitus without complication (HCC)     hx of 2006 no longer on meds   . Arthritis     Assessment/Plan: 1 Day Post-Op Procedure(s) (LRB): LEFT TOTAL KNEE ARTHROPLASTY (Left) Active Problems:   S/P total knee replacement  Estimated body mass index  is 25.03 kg/(m^2) as calculated from the following:   Height as of this encounter: 5\' 3"  (1.6 m).   Weight as of this encounter: 64.071 kg (141 lb 4 oz). Advance diet Up with therapy D/C IV fluids when tolerating POs well  DVT Prophylaxis - Aspirin Weight-Bearing as tolerated    Dimitri PedAmber Kniyah Khun, PA-C Orthopaedic Surgery 03/29/2015, 9:14 AM

## 2015-03-29 NOTE — Discharge Instructions (Signed)
INSTRUCTIONS AFTER JOINT REPLACEMENT  ° °o Remove items at home which could result in a fall. This includes throw rugs or furniture in walking pathways °o ICE to the affected joint every three hours while awake for 30 minutes at a time, for at least the first 3-5 days, and then as needed for pain and swelling.  Continue to use ice for pain and swelling. You may notice swelling that will progress down to the foot and ankle.  This is normal after surgery.  Elevate your leg when you are not up walking on it.   °o Continue to use the breathing machine you got in the hospital (incentive spirometer) which will help keep your temperature down.  It is common for your temperature to cycle up and down following surgery, especially at night when you are not up moving around and exerting yourself.  The breathing machine keeps your lungs expanded and your temperature down. ° ° °DIET:  As you were doing prior to hospitalization, we recommend a well-balanced diet. ° °DRESSING / WOUND CARE / SHOWERING ° °Keep the surgical dressing until follow up.  The dressing is water proof, so you can shower without any extra covering.  IF THE DRESSING FALLS OFF or the wound gets wet inside, change the dressing with sterile gauze.  Please use good hand washing techniques before changing the dressing.  Do not use any lotions or creams on the incision until instructed by your surgeon.   ° °ACTIVITY ° °o Increase activity slowly as tolerated, but follow the weight bearing instructions below.   °o No driving for 6 weeks or until further direction given by your physician.  You cannot drive while taking narcotics.  °o No lifting or carrying greater than 10 lbs. until further directed by your surgeon. °o Avoid periods of inactivity such as sitting longer than an hour when not asleep. This helps prevent blood clots.  °o You may return to work once you are authorized by your doctor.  ° ° ° °WEIGHT BEARING  ° °Weight bearing as tolerated with assist  device (walker, cane, etc) as directed, use it as long as suggested by your surgeon or therapist, typically at least 4-6 weeks. ° ° °EXERCISES ° °Results after joint replacement surgery are often greatly improved when you follow the exercise, range of motion and muscle strengthening exercises prescribed by your doctor. Safety measures are also important to protect the joint from further injury. Any time any of these exercises cause you to have increased pain or swelling, decrease what you are doing until you are comfortable again and then slowly increase them. If you have problems or questions, call your caregiver or physical therapist for advice.  ° °Rehabilitation is important following a joint replacement. After just a few days of immobilization, the muscles of the leg can become weakened and shrink (atrophy).  These exercises are designed to build up the tone and strength of the thigh and leg muscles and to improve motion. Often times heat used for twenty to thirty minutes before working out will loosen up your tissues and help with improving the range of motion but do not use heat for the first two weeks following surgery (sometimes heat can increase post-operative swelling).  ° °These exercises can be done on a training (exercise) mat, on the floor, on a table or on a bed. Use whatever works the best and is most comfortable for you.    Use music or television while you are exercising so that   the exercises are a pleasant break in your day. This will make your life better with the exercises acting as a break in your routine that you can look forward to.   Perform all exercises about fifteen times, three times per day or as directed.  You should exercise both the operative leg and the other leg as well. ° °Exercises include: °  °• Quad Sets - Tighten up the muscle on the front of the thigh (Quad) and hold for 5-10 seconds.   °• Straight Leg Raises - With your knee straight (if you were given a brace, keep it on),  lift the leg to 60 degrees, hold for 3 seconds, and slowly lower the leg.  Perform this exercise against resistance later as your leg gets stronger.  °• Leg Slides: Lying on your back, slowly slide your foot toward your buttocks, bending your knee up off the floor (only go as far as is comfortable). Then slowly slide your foot back down until your leg is flat on the floor again.  °• Angel Wings: Lying on your back spread your legs to the side as far apart as you can without causing discomfort.  °• Hamstring Strength:  Lying on your back, push your heel against the floor with your leg straight by tightening up the muscles of your buttocks.  Repeat, but this time bend your knee to a comfortable angle, and push your heel against the floor.  You may put a pillow under the heel to make it more comfortable if necessary.  ° °A rehabilitation program following joint replacement surgery can speed recovery and prevent re-injury in the future due to weakened muscles. Contact your doctor or a physical therapist for more information on knee rehabilitation.  ° ° °CONSTIPATION ° °Constipation is defined medically as fewer than three stools per week and severe constipation as less than one stool per week.  Even if you have a regular bowel pattern at home, your normal regimen is likely to be disrupted due to multiple reasons following surgery.  Combination of anesthesia, postoperative narcotics, change in appetite and fluid intake all can affect your bowels.  ° °YOU MUST use at least one of the following options; they are listed in order of increasing strength to get the job done.  They are all available over the counter, and you may need to use some, POSSIBLY even all of these options:   ° °Drink plenty of fluids (prune juice may be helpful) and high fiber foods °Colace 100 mg by mouth twice a day  °Senokot for constipation as directed and as needed Dulcolax (bisacodyl), take with full glass of water  °Miralax (polyethylene glycol)  once or twice a day as needed. ° °If you have tried all these things and are unable to have a bowel movement in the first 3-4 days after surgery call either your surgeon or your primary doctor.   ° °If you experience loose stools or diarrhea, hold the medications until you stool forms back up.  If your symptoms do not get better within 1 week or if they get worse, check with your doctor.  If you experience "the worst abdominal pain ever" or develop nausea or vomiting, please contact the office immediately for further recommendations for treatment. ° ° °ITCHING:  If you experience itching with your medications, try taking only a single pain pill, or even half a pain pill at a time.  You can also use Benadryl over the counter for itching or also to   help with sleep.   TED HOSE STOCKINGS:  Use stockings on both legs until for at least 2 weeks or as directed by physician office. They may be removed at night for sleeping.  MEDICATIONS:  See your medication summary on the After Visit Summary that nursing will review with you.  You may have some home medications which will be placed on hold until you complete the course of blood thinner medication.  It is important for you to complete the blood thinner medication as prescribed. Take aspirin 325mg  twice daily to prevent blood clots  PRECAUTIONS:  If you experience chest pain or shortness of breath - call 911 immediately for transfer to the hospital emergency department.   If you develop a fever greater that 101 F, purulent drainage from wound, increased redness or drainage from wound, foul odor from the wound/dressing, or calf pain - CONTACT YOUR SURGEON.                                                   FOLLOW-UP APPOINTMENTS:  If you do not already have a post-op appointment, please call the office for an appointment to be seen by your surgeon.  Guidelines for how soon to be seen are listed in your After Visit Summary, but are typically between 1-4 weeks  after surgery.   MAKE SURE YOU:   Understand these instructions.   Get help right away if you are not doing well or get worse.    Thank you for letting us be a part of your medical care team.  It is a privilege we respect greatly.  We hope these instructions will help you stay on track for a fast and full recovery!

## 2015-03-29 NOTE — Evaluation (Signed)
Physical Therapy Evaluation Patient Details Name: Sandra McalpineShirley J Figueroa MRN: [id number] DOB: 1940/08/13 Today's Date: 03/29/2015   History of Present Illness  s/p L TKA; PMHx: bil THA  Clinical Impression  Pt is s/p TKA resulting in the deficits listed below (see PT Problem List).  Pt will benefit from skilled PT to increase their independence and safety with mobility to allow discharge to the venue listed below.  Pt with profuse N/V after amb; RN notified of nausea (and vomiting per pt dtr) prior to mobility and gave meds at that time;  Pt very cooperative with PT despite not feeling well; will see again this pm;  pt has excellent family support and her dtrs have worked out at plan to provide care 24/7 initially    Follow Up Recommendations Home health PT;Supervision for mobility/OOB    Equipment Recommendations  Rolling walker with 5" wheels;None recommended by PT    Recommendations for Other Services       Precautions / Restrictions Precautions Precautions: Fall;Knee Required Braces or Orthoses: Knee Immobilizer - Left Knee Immobilizer - Left: Discontinue once straight leg raise with < 10 degree lag Restrictions Other Position/Activity Restrictions: WBAT      Mobility  Bed Mobility Overal bed mobility: Needs Assistance Bed Mobility: Supine to Sit     Supine to sit: Min assist     General bed mobility comments: assist with L LE and lateral scooting, cues for technique and use of UEs to self assist  Transfers Overall transfer level: Needs assistance Equipment used: Rolling walker (2 wheeled) Transfers: Sit to/from Stand Sit to Stand: Min assist         General transfer comment: assist with wt shfit to stand and to steady during transition to RW, cues for hand placment and control of descent  Ambulation/Gait Ambulation/Gait assistance: Min assist Ambulation Distance (Feet): 20 Feet Assistive device: Rolling walker (2 wheeled) Gait Pattern/deviations: Step-to  pattern;Trunk flexed     General Gait Details: verbal cues for RW safety and sequence  Stairs            Wheelchair Mobility    Modified Rankin (Stroke Patients Only)       Balance Overall balance assessment: Needs assistance           Standing balance-Leahy Scale: Poor                               Pertinent Vitals/Pain Pain Assessment: 0-10 Pain Score: 1  Pain Location: L hip Pain Descriptors / Indicators: Discomfort Pain Intervention(s): Limited activity within patient's tolerance;Monitored during session;Premedicated before session;Repositioned    Home Living Family/patient expects to be discharged to:: Private residence Living Arrangements: Alone Available Help at Discharge: Family;Available 24 hours/day Type of Home: Mobile home Home Access: Stairs to enter Entrance Stairs-Rails: Right;Left Entrance Stairs-Number of Steps: 4 Home Layout: One level Home Equipment: Walker - 2 wheels;Bedside commode      Prior Function Level of Independence: Independent               Hand Dominance        Extremity/Trunk Assessment   Upper Extremity Assessment: Defer to OT evaluation           Lower Extremity Assessment: LLE deficits/detail   LLE Deficits / Details: ankle WFL, knee extension and hip flexion 2 to 2+/5     Communication   Communication: No difficulties  Cognition Arousal/Alertness: Awake/alert Behavior During [facility] for tasks  assessed/performed Overall Cognitive Status: Within Functional Limits for tasks assessed                      General Comments      Exercises        Assessment/Plan    PT Assessment Patient needs continued PT services  PT Diagnosis Difficulty walking   PT Problem List Decreased strength;Decreased range of motion;Decreased activity tolerance;Decreased mobility;Decreased knowledge of use of DME;Pain  PT Treatment Interventions DME instruction;Gait training;Functional  mobility training;Stair training;Therapeutic activities;Therapeutic exercise;Patient/family education   PT Goals (Current goals can be found in the Care Plan section) Acute Rehab PT Goals PT Goal Formulation: With patient Time For Goal Achievement: 04/02/15 Potential to Achieve Goals: Good    Frequency 7X/week   Barriers to discharge        Co-evaluation               End of Session Equipment Utilized During Treatment: Gait belt;Left knee immobilizer Activity Tolerance: Patient tolerated treatment well Patient left: in chair;with call bell/phone within reach;with family/visitor present Nurse Communication: Mobility status         Time: 1610-9604 PT Time Calculation (min) (ACUTE ONLY): 25 min   Charges:   PT Evaluation $Initial PT Evaluation Tier I: 1 Procedure PT Treatments $Gait Training: 8-22 mins   PT G Codes:        Jalana Moore 2015-04-08, 1:48 PM

## 2015-03-29 NOTE — Progress Notes (Signed)
Report received from Hilaria OtaKatie, S. RN. Care assumed for pt at this time. Pt sleeping, resp even, unlabored. Dsg to left knee c/d/i. Dtr bedside. Will monitor.

## 2015-03-30 LAB — BASIC METABOLIC PANEL
ANION GAP: 6 (ref 5–15)
BUN: 16 mg/dL (ref 6–20)
CALCIUM: 8.7 mg/dL — AB (ref 8.9–10.3)
CHLORIDE: 95 mmol/L — AB (ref 101–111)
CO2: 27 mmol/L (ref 22–32)
CREATININE: 0.78 mg/dL (ref 0.44–1.00)
GFR calc non Af Amer: >60 mL/min (ref >60)
Glucose, Bld: 119 mg/dL — ABNORMAL HIGH (ref 65–99)
Potassium: 5 mmol/L (ref 3.5–5.1)
SODIUM: 128 mmol/L — AB (ref 135–145)

## 2015-03-30 LAB — CBC
HCT: 26.3 % — ABNORMAL LOW (ref 36.0–46.0)
Hemoglobin: 9.1 g/dL — ABNORMAL LOW (ref 12.0–15.0)
MCH: 29.8 pg (ref 26.0–34.0)
MCHC: 34.6 g/dL (ref 30.0–36.0)
MCV: 86.2 fL (ref 78.0–100.0)
Platelets: 140 10*3/uL — ABNORMAL LOW (ref 150–400)
RBC: 3.05 MIL/uL — ABNORMAL LOW (ref 3.87–5.11)
RDW: 12.9 % (ref 11.5–15.5)
WBC: 7.8 10*3/uL (ref 4.0–10.5)

## 2015-03-30 MED ORDER — PROMETHAZINE HCL 12.5 MG PO TABS: 12.5000 mg | ORAL_TABLET | Freq: Four times a day (QID) | ORAL | Status: DC | PRN

## 2015-03-30 NOTE — Progress Notes (Signed)
Physical Therapy Treatment Patient Details Name: [name] MRN: [id number] DOB: 1940/10/30 Today's Date: 03/30/2015    History of Present Illness s/p L TKA; PMHx: bil THA    PT Comments    Pt improving with mobility but became diaphoretic/near LOC after going up down 3 steps; pt had to sit in rolling chair in hallway; difficult to get BP reading with dinamap but after a few attempts BP 126/41, HR 39 adn O2 sats 100% on RA; pt's recliner brought to hallway and pt able to perform stand pivot to recliner and reported feeling better after reclined; dtr present; RN notified of events  Follow Up Recommendations  Home health PT;Supervision/Assistance - 24 hour     Equipment Recommendations  Rolling walker with 5" wheels;None recommended by PT    Recommendations for Other Services       Precautions / Restrictions Precautions Precautions: Fall;Knee Required Braces or Orthoses: Knee Immobilizer - Left Knee Immobilizer - Left: Discontinue once straight leg raise with < 10 degree lag Restrictions Weight Bearing Restrictions: No Other Position/Activity Restrictions: WBAT    Mobility  Bed Mobility Overal bed mobility: Needs Assistance Bed Mobility: Supine to Sit     Supine to sit: Min assist     General bed mobility comments: assist with L LE and lateral scooting, cues for technique and use of UEs to self assist  Transfers Overall transfer level: Needs assistance Equipment used: Rolling walker (2 wheeled) Transfers: Sit to/from [employer] Sit to Stand: Min guard;Min assist Stand pivot transfers: Min assist       General transfer comment: assist with wt shfit to stand and to steady during transition to RW, cues for hand placment and control of descent  Ambulation/Gait Ambulation/Gait assistance: Min assist;Min guard Ambulation Distance (Feet): 30 Feet Assistive device: Rolling walker (2 wheeled) Gait Pattern/deviations: Step-to  pattern;Antalgic;Decreased weight shift to right     General Gait Details: verbal cues for RW safety and sequence   Stairs Stairs: Yes Stairs assistance: Min guard Stair Management: Two rails;Step to pattern;Forwards Number of Stairs: 3 General stair comments: cues for sequence  Wheelchair Mobility    Modified Rankin (Stroke Patients Only)       Balance             Standing balance-Leahy Scale: Poor                      Cognition Arousal/Alertness: Awake/alert Behavior During Therapy: WFL for tasks assessed/performed Overall Cognitive Status: Within Functional Limits for tasks assessed                      Exercises Total Joint Exercises Ankle Circles/Pumps: AROM;Both;10 reps Quad Sets: Strengthening;Both;10 reps Heel Slides: AAROM;Left;10 reps;PROM;Limitations Straight Leg Raises: AAROM;Left;10 reps    General Comments        Pertinent Vitals/Pain Pain Assessment: 0-10 Pain Score: 3  Pain Location: L hip Pain Descriptors / Indicators: Sore Pain Intervention(s): Limited activity within patient's tolerance;Monitored during session;Premedicated before session;Repositioned;Ice applied    Home Living                      Prior Function            PT Goals (current goals can now be found in the care plan section) Acute Rehab PT Goals Patient Stated Goal: to feel better  PT Goal Formulation: With patient Time For Goal Achievement: 04/02/15 Potential to Achieve Goals: Good Progress towards PT  goals: Progressing toward goals    Frequency  7X/week    PT Plan Current plan remains appropriate    Co-evaluation             End of Session Equipment Utilized During Treatment: Gait belt;Left knee immobilizer Activity Tolerance: Treatment limited secondary to medical complications (Comment) Patient left: in chair;with call bell/phone within reach;with family/visitor present     Time: 1035-1056 PT Time Calculation (min)  (ACUTE ONLY): 21 min  Charges:  $Gait Training: 8-22 mins                    G Codes:      Alleah Dearman 03/30/2015, 11:57 AM

## 2015-03-30 NOTE — Progress Notes (Signed)
   Subjective: 2 Days Post-Op Procedure(s) (LRB): LEFT TOTAL KNEE ARTHROPLASTY (Left) Patient reports pain as mild.   Patient seen in rounds for Dr. Thomasena Edisollins. Patient is well, and has had no acute complaints or problems She is doing much better in regards to nausea and vomiting. No SOB or chest pain. No issues overnight. Reports readiness to go home today.    Objective: Vital signs in last 24 hours: Temp:  [97.8 F (36.6 C)-98.6 F (37 C)] 98.6 F (37 C) (12/18 0513) Pulse Rate:  [39-45] 45 (12/18 0513) Resp:  [14-18] 16 (12/18 0513) BP: (123-157)/(44-65) 157/45 mmHg (12/18 0513) SpO2:  [95 %-100 %] 96 % (12/18 0513)  Intake/Output from previous day:  Intake/Output Summary (Last 24 hours) at 03/30/15 0745 Last data filed at 03/30/15 0514  Gross per 24 hour  Intake   1020 ml  Output    302 ml  Net    718 ml     Labs:  Recent Labs  03/28/15 0820 03/29/15 0434 03/30/15 0450  HGB 12.4 9.9* 9.1*    Recent Labs  03/29/15 0434 03/30/15 0450  WBC 9.3 7.8  RBC 3.26* 3.05*  HCT 28.2* 26.3*  PLT 135* 140*    Recent Labs  03/29/15 0434 03/30/15 0450  NA 130* 128*  K 4.6 5.0  CL 99* 95*  CO2 25 27  BUN 18 16  CREATININE 0.81 0.78  GLUCOSE 156* 119*  CALCIUM 8.6* 8.7*    Recent Labs  03/28/15 0820  INR 1.04    EXAM General - Patient is Alert and Oriented Extremity - Neurologically intact Intact pulses distally Dorsiflexion/Plantar flexion intact Compartment soft Dressing/Incision - Aquacel intact. Bloody drainage from drain site.  Motor Function - intact, moving foot and toes well on exam.   Past Medical History  Diagnosis Date  . Thyroid disease   . Hypertension   . Hypothyroidism   . Diabetes mellitus without complication (HCC)     hx of 2006 no longer on meds   . Arthritis     Assessment/Plan: 2 Days Post-Op Procedure(s) (LRB): LEFT TOTAL KNEE ARTHROPLASTY (Left) Active Problems:   S/P total knee replacement  Estimated body mass index  is 25.03 kg/(m^2) as calculated from the following:   Height as of this encounter: 5\' 3"  (1.6 m).   Weight as of this encounter: 64.071 kg (141 lb 4 oz). Advance diet Up with therapy Discharge home with home health  DVT Prophylaxis - Aspirin Weight-Bearing as tolerated   Dimitri PedAmber Yania Bogie, PA-C Orthopaedic Surgery 03/30/2015, 7:45 AM

## 2015-03-30 NOTE — Progress Notes (Signed)
Discharged from floor via w/c, family & belongings with pt. No changes in assessment. Takesha Steger  

## 2015-03-30 NOTE — Progress Notes (Signed)
   03/30/15 1400  PT Visit Information  Last PT Received On 03/30/15  Assistance Needed +1  History of Present Illness s/p L TKA; PMHx: bil THA  No dizziness with amb this pm; RN aware; pt wants to go home  PT Time Calculation  PT Start Time (ACUTE ONLY) 1405  PT Stop Time (ACUTE ONLY) 1417  PT Time Calculation (min) (ACUTE ONLY) 12 min  Subjective Data  Patient Stated Goal to feel better   Precautions  Precautions Fall;Knee  Required Braces or Orthoses Knee Immobilizer - Left  Knee Immobilizer - Left Discontinue once straight leg raise with < 10 degree lag  Restrictions  Other Position/Activity Restrictions WBAT  Pain Assessment  Pain Assessment 0-10  Pain Score 4  Pain Location L hip  Pain Descriptors / Indicators Sore  Pain Intervention(s) Limited activity within patient's tolerance;Monitored during session;Patient requesting pain meds-RN notified;RN gave pain meds during session;Ice applied  Cognition  Arousal/Alertness Awake/alert  Behavior During Therapy WFL for tasks assessed/performed  Overall Cognitive Status Within Functional Limits for tasks assessed  Bed Mobility  General bed mobility comments NT - pt in recliner  Transfers  Overall transfer level Needs assistance  Equipment used Rolling walker (2 wheeled)  Transfers Sit to/from Stand  Sit to Stand Min guard  General transfer comment cues for hand placement  Ambulation/Gait  Ambulation/Gait assistance Min guard  Ambulation Distance (Feet) 80 Feet  Assistive device Rolling walker (2 wheeled)  Gait Pattern/deviations Step-to pattern;Decreased step length - right;Decreased step length - left;Trunk flexed  General Gait Details verbal cues for RW safety and sequence  PT - End of Session  Activity Tolerance Patient tolerated treatment well  Patient left in chair;with call bell/phone within reach;with family/visitor present  Nurse Communication Mobility status  PT - Assessment/Plan  PT Plan Current plan remains  appropriate  PT Frequency (ACUTE ONLY) 7X/week  Follow Up Recommendations Home health PT;Supervision/Assistance - 24 hour  PT equipment None recommended by PT  PT Goal Progression  Progress towards PT goals Progressing toward goals  Acute Rehab PT Goals  PT Goal Formulation With patient  Time For Goal Achievement 04/02/15  Potential to Achieve Goals Good  PT General Charges  $$ ACUTE PT VISIT 1 Procedure  PT Treatments  $Gait Training 8-22 mins

## 2015-04-19 NOTE — Discharge Summary (Signed)
Physician Discharge Summary  Patient ID: [name] Kozlov MRN: [id number] DOB/AGE: [birth year] 77 y.o.  Admit date: 03/28/2015 Discharge date: 04/19/2015  Admission Diagnoses: knee oa  Discharge Diagnoses:  Active Problems:   S/P total knee replacement   Discharged Condition: good  Hospital Course: [name] Poyer is a 75 y.o. who was admitted to [facility]. They were brought to the operating room on 03/28/2015 and underwent Procedure(s): LEFT TOTAL KNEE ARTHROPLASTY.  Patient tolerated the procedure well and was later transferred to the recovery room and then to the orthopaedic floor for postoperative care.  They were given PO and IV analgesics for pain control following their surgery.  They were given 24 hours of postoperative antibiotics of  Anti-infectives    Start     Dose/Rate Route Frequency Ordered Stop   03/28/15 2200  vancomycin (VANCOCIN) IVPB 1000 mg/200 mL premix     1,000 mg 200 mL/hr over 60 Minutes Intravenous  Once 03/28/15 2131 03/28/15 2337   03/28/15 0732  vancomycin (VANCOCIN) IVPB 1000 mg/200 mL premix     1,000 mg 200 mL/hr over 60 Minutes Intravenous On call to O.R. 03/28/15 0732 03/28/15 1048     and started on DVT prophylaxis in the form of lovenox.   PT and OT were ordered for total joint protocol.  Discharge planning consulted to help with postop disposition and equipment needs.  Patient had a good night on the evening of surgery and started to get up OOB with therapy on day one.  Hemovac drain was pulled without difficulty.  Continued to work with therapy into day two.  Dressing was with normal limits.  The patient had progressed with therapy and meeting their goals. Patient was seen in rounds and was ready to go home.  Consults: n/a  Significant Diagnostic Studies: routine  Treatments: routine  Discharge Exam: Blood pressure 126/41, pulse 39, temperature 98.6 F (37 C), temperature source Oral, resp. rate 16, height 5\' 3"  (1.6 m), weight  64.071 kg (141 lb 4 oz), SpO2 100 %. Alert and oriented x3. RRR, Lungs clear, BS x4. Left Calf soft and non tender. L knee dressing C/D/I. No DVT signs. No signs of infection or compartment syndrome. LLE grossly neurovascularly intact.   Disposition: 06-Home-Health Care Svc  Discharge Instructions    Call MD / Call 911    Complete by:  As directed   If you experience chest pain or shortness of breath, CALL 911 and be transported to the hospital emergency room.  If you develope a fever above 101 F, pus (white drainage) or increased drainage or redness at the wound, or calf pain, call your surgeon's office.     Constipation Prevention    Complete by:  As directed   Drink plenty of fluids.  Prune juice may be helpful.  You may use a stool softener, such as Colace (over the counter) 100 mg twice a day.  Use MiraLax (over the counter) for constipation as needed.     Diet - low sodium heart healthy    Complete by:  As directed      Discharge instructions    Complete by:  As directed   INSTRUCTIONS AFTER JOINT REPLACEMENT   Remove items at home which could result in a fall. This includes throw rugs or furniture in walking pathways ICE to the affected joint every three hours while awake for 30 minutes at a time, for at least the first 3-5 days, and then as needed for pain and  swelling.  Continue to use ice for pain and swelling. You may notice swelling that will progress down to the foot and ankle.  This is normal after surgery.  Elevate your leg when you are not up walking on it.   Continue to use the breathing machine you got in the hospital (incentive spirometer) which will help keep your temperature down.  It is common for your temperature to cycle up and down following surgery, especially at night when you are not up moving around and exerting yourself.  The breathing machine keeps your lungs expanded and your temperature down.   DIET:  As you were doing prior to hospitalization, we recommend a  well-balanced diet.  DRESSING / WOUND CARE / SHOWERING  Keep the surgical dressing until follow up.  The dressing is water proof, so you can shower without any extra covering.  IF THE DRESSING FALLS OFF or the wound gets wet inside, change the dressing with sterile gauze.  Please use good hand washing techniques before changing the dressing.  Do not use any lotions or creams on the incision until instructed by your surgeon.    ACTIVITY  Increase activity slowly as tolerated, but follow the weight bearing instructions below.   No driving for 6 weeks or until further direction given by your physician.  You cannot drive while taking narcotics.  No lifting or carrying greater than 10 lbs. until further directed by your surgeon. Avoid periods of inactivity such as sitting longer than an hour when not asleep. This helps prevent blood clots.  You may return to work once you are authorized by your doctor.     WEIGHT BEARING   Weight bearing as tolerated with assist device (walker, cane, etc) as directed, use it as long as suggested by your surgeon or therapist, typically at least 4-6 weeks.   EXERCISES  Results after joint replacement surgery are often greatly improved when you follow the exercise, range of motion and muscle strengthening exercises prescribed by your doctor. Safety measures are also important to protect the joint from further injury. Any time any of these exercises cause you to have increased pain or swelling, decrease what you are doing until you are comfortable again and then slowly increase them. If you have problems or questions, call your caregiver or physical therapist for advice.   Rehabilitation is important following a joint replacement. After just a few days of immobilization, the muscles of the leg can become weakened and shrink (atrophy).  These exercises are designed to build up the tone and strength of the thigh and leg muscles and to improve motion. Often times heat  used for twenty to thirty minutes before working out will loosen up your tissues and help with improving the range of motion but do not use heat for the first two weeks following surgery (sometimes heat can increase post-operative swelling).   These exercises can be done on a training (exercise) mat, on the floor, on a table or on a bed. Use whatever works the best and is most comfortable for you.    Use music or television while you are exercising so that the exercises are a pleasant break in your day. This will make your life better with the exercises acting as a break in your routine that you can look forward to.   Perform all exercises about fifteen times, three times per day or as directed.  You should exercise both the operative leg and the other leg as well.  Exercises include:   Quad Sets - Tighten up the muscle on the front of the thigh (Quad) and hold for 5-10 seconds.   Straight Leg Raises - With your knee straight (if you were given a brace, keep it on), lift the leg to 60 degrees, hold for 3 seconds, and slowly lower the leg.  Perform this exercise against resistance later as your leg gets stronger.  Leg Slides: Lying on your back, slowly slide your foot toward your buttocks, bending your knee up off the floor (only go as far as is comfortable). Then slowly slide your foot back down until your leg is flat on the floor again.  Angel Wings: Lying on your back spread your legs to the side as far apart as you can without causing discomfort.  Hamstring Strength:  Lying on your back, push your heel against the floor with your leg straight by tightening up the muscles of your buttocks.  Repeat, but this time bend your knee to a comfortable angle, and push your heel against the floor.  You may put a pillow under the heel to make it more comfortable if necessary.   A rehabilitation program following joint replacement surgery can speed recovery and prevent re-injury in the future due to weakened  muscles. Contact your doctor or a physical therapist for more information on knee rehabilitation.    CONSTIPATION  Constipation is defined medically as fewer than three stools per week and severe constipation as less than one stool per week.  Even if you have a regular bowel pattern at home, your normal regimen is likely to be disrupted due to multiple reasons following surgery.  Combination of anesthesia, postoperative narcotics, change in appetite and fluid intake all can affect your bowels.   YOU MUST use at least one of the following options; they are listed in order of increasing strength to get the job done.  They are all available over the counter, and you may need to use some, POSSIBLY even all of these options:    Drink plenty of fluids (prune juice may be helpful) and high fiber foods Colace 100 mg by mouth twice a day  Senokot for constipation as directed and as needed Dulcolax (bisacodyl), take with full glass of water  Miralax (polyethylene glycol) once or twice a day as needed.  If you have tried all these things and are unable to have a bowel movement in the first 3-4 days after surgery call either your surgeon or your primary doctor.    If you experience loose stools or diarrhea, hold the medications until you stool forms back up.  If your symptoms do not get better within 1 week or if they get worse, check with your doctor.  If you experience "the worst abdominal pain ever" or develop nausea or vomiting, please contact the office immediately for further recommendations for treatment.   ITCHING:  If you experience itching with your medications, try taking only a single pain pill, or even half a pain pill at a time.  You can also use Benadryl over the counter for itching or also to help with sleep.   TED HOSE STOCKINGS:  Use stockings on both legs until for at least 2 weeks or as directed by physician office. They may be removed at night for sleeping.  MEDICATIONS:  See your  medication summary on the "After Visit Summary" that nursing will review with you.  You may have some home medications which will be placed on hold until  you complete the course of blood thinner medication.  It is important for you to complete the blood thinner medication as prescribed.  PRECAUTIONS:  If you experience chest pain or shortness of breath - call 911 immediately for transfer to the hospital emergency department.   If you develop a fever greater that 101 F, purulent drainage from wound, increased redness or drainage from wound, foul odor from the wound/dressing, or calf pain - CONTACT YOUR SURGEON.                                                   FOLLOW-UP APPOINTMENTS:  If you do not already have a post-op appointment, please call the office for an appointment to be seen by your surgeon.  Guidelines for how soon to be seen are listed in your "After Visit Summary", but are typically between 1-4 weeks after surgery.  OTHER INSTRUCTIONS: Take aspirin 325mg  twice daily to prevent blood clots  MAKE SURE YOU:  Understand these instructions.  Get help right away if you are not doing well or get worse.    Thank you for letting us be a part of your medical care team.  It is a privilege we respect greatly.  We hope these instructions will help you stay on track for a fast and full recovery!     Increase activity slowly as tolerated    Complete by:  As directed             Medication List    TAKE these medications        furosemide 20 MG tablet  Commonly known as:  LASIX  Take 20 mg by mouth daily as needed for fluid.     HYDROcodone-acetaminophen 7.5-325 MG tablet  Commonly known as:  NORCO  Take 1-2 tablets by mouth every 4 (four) hours.     levothyroxine 100 MCG tablet  Commonly known as:  SYNTHROID, LEVOTHROID  Take 100 mcg by mouth daily before breakfast.     lisinopril 10 MG tablet  Commonly known as:  PRINIVIL,ZESTRIL  Take 10 mg by mouth daily.     methocarbamol  500 MG tablet  Commonly known as:  ROBAXIN  Take 1 tablet (500 mg total) by mouth every 8 (eight) hours as needed for muscle spasms.     promethazine 12.5 MG tablet  Commonly known as:  PHENERGAN  Take 1 tablet (12.5 mg total) by mouth every 6 (six) hours as needed for nausea or vomiting.           Follow-up Information    Follow up with Erasmo Leventhal, MD In 2 weeks.   Specialty:  Orthopedic Surgery   Contact information:   [address] [place] [address] [phone]       Follow up with [facility].   Why:  physical therapy   Contact information:   [address] [place] [address] [phone]       Signed: Markham Jordan 04/19/2015, 11:19 AM

## 2015-06-11 DIAGNOSIS — Z96652 Presence of left artificial knee joint: Secondary | ICD-10-CM | POA: Diagnosis not present

## 2015-06-11 DIAGNOSIS — M25531 Pain in right wrist: Secondary | ICD-10-CM | POA: Diagnosis not present

## 2015-06-11 DIAGNOSIS — Z471 Aftercare following joint replacement surgery: Secondary | ICD-10-CM | POA: Diagnosis not present

## 2015-06-11 DIAGNOSIS — M79674 Pain in right toe(s): Secondary | ICD-10-CM | POA: Diagnosis not present

## 2015-06-11 DIAGNOSIS — M25511 Pain in right shoulder: Secondary | ICD-10-CM | POA: Diagnosis not present

## 2015-06-11 DIAGNOSIS — M25519 Pain in unspecified shoulder: Secondary | ICD-10-CM | POA: Diagnosis not present

## 2015-06-18 DIAGNOSIS — Z4789 Encounter for other orthopedic aftercare: Secondary | ICD-10-CM | POA: Diagnosis not present

## 2015-06-18 DIAGNOSIS — M19011 Primary osteoarthritis, right shoulder: Secondary | ICD-10-CM | POA: Diagnosis not present

## 2015-08-07 DIAGNOSIS — M19011 Primary osteoarthritis, right shoulder: Secondary | ICD-10-CM | POA: Diagnosis not present

## 2015-08-16 DIAGNOSIS — M25511 Pain in right shoulder: Secondary | ICD-10-CM | POA: Diagnosis not present

## 2015-08-22 DIAGNOSIS — R609 Edema, unspecified: Secondary | ICD-10-CM | POA: Diagnosis not present

## 2015-08-22 DIAGNOSIS — E119 Type 2 diabetes mellitus without complications: Secondary | ICD-10-CM | POA: Diagnosis not present

## 2015-08-22 DIAGNOSIS — R001 Bradycardia, unspecified: Secondary | ICD-10-CM | POA: Diagnosis not present

## 2015-08-22 DIAGNOSIS — I10 Essential (primary) hypertension: Secondary | ICD-10-CM | POA: Diagnosis not present

## 2015-08-22 DIAGNOSIS — E039 Hypothyroidism, unspecified: Secondary | ICD-10-CM | POA: Diagnosis not present

## 2015-08-22 DIAGNOSIS — Z6825 Body mass index (BMI) 25.0-25.9, adult: Secondary | ICD-10-CM | POA: Diagnosis not present

## 2015-08-22 DIAGNOSIS — M19011 Primary osteoarthritis, right shoulder: Secondary | ICD-10-CM | POA: Diagnosis not present

## 2015-08-26 NOTE — H&P (Signed)
Sandra McalpineShirley J Figueroa is an 75 y.o. female.    Chief Complaint: right shoulder pain  HPI: Pt is a 75 y.o. female complaining of right shoulder pain for multiple years. Pain had continually increased since the beginning. X-rays in the clinic show end-stage arthritic changes of the right shoulder. Pt has tried various conservative treatments which have failed to alleviate their symptoms, including injections and therapy. Various options are discussed with the patient. Risks, benefits and expectations were discussed with the patient. Patient understand the risks, benefits and expectations and wishes to proceed with surgery.   PCP:  Arlyss QueenONROY,NATHAN, PA-C  D/C Plans: Home  PMH: Past Medical History  Diagnosis Date  . Thyroid disease   . Hypertension   . Hypothyroidism   . Diabetes mellitus without complication (HCC)     hx of 2006 no longer on meds   . Arthritis     PSH: Past Surgical History  Procedure Laterality Date  . Total hip arthroplasty      bilateral  . Cholecystectomy    . Appendectomy    . Thyroidectomy    . Tubal ligation    . Bilateral cataract surgery     . Total knee arthroplasty Left 03/28/2015    Procedure: LEFT TOTAL KNEE ARTHROPLASTY;  Surgeon: Sandra Mcalpineobert Collins, MD;  Location: [facility];  Service: Orthopedics;  Laterality: Left;    Social History:  reports that she has never smoked. She has never used smokeless tobacco. She reports that she does not drink alcohol or use illicit drugs.  Allergies:  Allergies  Allergen Reactions  . Oxycodone Other (See Comments)    Makes her feel "weird" and she prefers not to take it  . Penicillins Swelling    Has patient had a PCN reaction causing immediate rash, facial/tongue/throat swelling, SOB or lightheadedness with hypotension: No Has patient had a PCN reaction causing severe rash involving mucus membranes or skin necrosis: No Has patient had a PCN reaction that required hospitalization No Has patient had a PCN reaction  occurring within the last 10 years: No If all of the above answers are "NO", then may proceed with Cephalosporin use.     Medications: No current facility-administered medications for this encounter.   Current Outpatient Prescriptions  Medication Sig Dispense Refill  . furosemide (LASIX) 20 MG tablet Take 20 mg by mouth daily as needed for fluid.     Marland Kitchen. HYDROcodone-acetaminophen (NORCO) 7.5-325 MG tablet Take 1-2 tablets by mouth every 4 (four) hours. 60 tablet 0  . levothyroxine (SYNTHROID, LEVOTHROID) 100 MCG tablet Take 100 mcg by mouth daily before breakfast.     . lisinopril (PRINIVIL,ZESTRIL) 10 MG tablet Take 10 mg by mouth daily.    . methocarbamol (ROBAXIN) 500 MG tablet Take 1 tablet (500 mg total) by mouth every 8 (eight) hours as needed for muscle spasms. 40 tablet 1  . promethazine (PHENERGAN) 12.5 MG tablet Take 1 tablet (12.5 mg total) by mouth every 6 (six) hours as needed for nausea or vomiting. 30 tablet 0    No results found for this or any previous visit (from the past 48 hour(s)). No results found.  ROS: Pain with rom of the right upper extremity  Physical Exam:  Alert and oriented 75 y.o. female in no acute distress Cranial nerves 2-12 intact Cervical spine: full rom with no tenderness, nv intact distally Chest: active breath sounds bilaterally, no wheeze rhonchi or rales Heart: regular rate and rhythm, no murmur Abd: non tender non distended with active bowel  sounds Hip is stable with rom  Limited rom and weakness due to cuff arthropathy nv intact distally No rashes or edema  Assessment/Plan Assessment: right rotator cuff insufficiency  Plan: Patient will undergo a right reverse total shoulder arthroplasty by Dr. Ranell Patrick at [facility]. Risks benefits and expectations were discussed with the patient. Patient understand risks, benefits and expectations and wishes to proceed.

## 2015-08-28 NOTE — Pre-Procedure Instructions (Addendum)
Sandra McalpineShirley J Figueroa  08/28/2015      [facility] - [place] - [address] [place] [address] [place] [address] [place] [id number] Phone: [phone] Fax: [phone]    Your procedure is scheduled on May 24  [facility] at 1215   Call this number if you have problems the morning of surgery:  [phone]   Remember:  Do not eat food or drink liquids after midnight.  Take these medicines the morning of surgery with A SIP OF WATER Tylenol if needed, levothyroxine (Synthroid)  Stop taking aspirin, BC's, Goody's,Herbal medications, Fish Oil, Aleve, Ibuprofen, Advil, Motrin   Do not wear jewelry, make-up or nail polish.  Do not wear lotions, powders, or perfumes.  You may wear deodorant.  Do not shave 48 hours prior to surgery.  Men may shave face and neck.  Do not bring valuables to the hospital.  [facility] is not responsible for any belongings or valuables.  Contacts, dentures or bridgework may not be worn into surgery.  Leave your suitcase in the car.  After surgery it may be brought to your room.  For patients admitted to the hospital, discharge time will be determined by your treatment team.  Patients discharged the day of surgery will not be allowed to drive home.    Special instructions:   - Preparing for Surgery  Before surgery, you can play an important role.  Because skin is not sterile, your skin needs to be as free of germs as possible.  You can reduce the number of germs on you skin by washing with CHG (chlorahexidine gluconate) soap before surgery.  CHG is an antiseptic cleaner which kills germs and bonds with the skin to continue killing germs even after washing.  Please DO NOT use if you have an allergy to CHG or antibacterial soaps.  If your skin becomes reddened/irritated stop using the CHG and inform your nurse when you arrive at Short Stay.  Do not shave (including legs and  underarms) for at least 48 hours prior to the first CHG shower.  You may shave your face.  Please follow these instructions carefully:   1.  Shower with CHG Soap the night before surgery and the  morning of Surgery.  2.  If you choose to wash your hair, wash your hair first as usual with your normal shampoo.  3.  After you shampoo, rinse your hair and body thoroughly to remove the    Shampoo.  4.  Use CHG as you would any other liquid soap.  You can apply chg directly     to the skin and wash gently with scrungie or a clean washcloth.  5.  Apply the CHG Soap to your body ONLY FROM THE NECK DOWN.        Do not use on open wounds or open sores.  Avoid contact with your eyes,   ears, mouth and genitals (private parts).  Wash genitals (private parts)  with your normal soap.  6.  Wash thoroughly, paying special attention to the area where your surgery   will be performed.  7.  Thoroughly rinse your body with warm water from the neck down.  8.  DO NOT shower/wash with your normal soap after using and rinsing off       the CHG Soap.  9.  Pat yourself dry with a clean towel.  10.  Wear clean pajamas.            11.  Place clean sheets on your bed the night of your first shower and do not        sleep with pets.  Day of Surgery  Do not apply any lotions/deoderants the morning of surgery.  Please wear clean clothes to the hospital/surgery center.     Please read over the following fact sheets that you were given. Pain Booklet, Coughing and Deep Breathing, MRSA Information and Surgical Site Infection Prevention, Incentive Spirometry

## 2015-08-29 ENCOUNTER — Encounter (HOSPITAL_COMMUNITY)
Admission: RE | Admit: 2015-08-29 | Discharge: 2015-08-29 | Disposition: A | Discharge status: Home / Self Care | Payer: PPO | Source: Ambulatory Visit | Attending: Orthopedic Surgery | Admitting: Orthopedic Surgery

## 2015-08-29 ENCOUNTER — Encounter (HOSPITAL_COMMUNITY): Payer: Self-pay

## 2015-08-29 DIAGNOSIS — Z01812 Encounter for preprocedural laboratory examination: Secondary | ICD-10-CM | POA: Insufficient documentation

## 2015-08-29 DIAGNOSIS — M19011 Primary osteoarthritis, right shoulder: Secondary | ICD-10-CM | POA: Insufficient documentation

## 2015-08-29 LAB — CBC
HEMATOCRIT: 35.2 % — AB (ref 36.0–46.0)
Hemoglobin: 11.5 g/dL — ABNORMAL LOW (ref 12.0–15.0)
MCH: 27.2 pg (ref 26.0–34.0)
MCHC: 32.7 g/dL (ref 30.0–36.0)
MCV: 83.2 fL (ref 78.0–100.0)
Platelets: 199 10*3/uL (ref 150–400)
RBC: 4.23 MIL/uL (ref 3.87–5.11)
RDW: 15.4 % (ref 11.5–15.5)
WBC: 5.4 10*3/uL (ref 4.0–10.5)

## 2015-08-29 LAB — SURGICAL PCR SCREEN
MRSA, PCR: NEGATIVE
Staphylococcus aureus: NEGATIVE

## 2015-08-29 LAB — BASIC METABOLIC PANEL
ANION GAP: 9 (ref 5–15)
BUN: 10 mg/dL (ref 6–20)
CALCIUM: 9.4 mg/dL (ref 8.9–10.3)
CO2: 28 mmol/L (ref 22–32)
CREATININE: 0.78 mg/dL (ref 0.44–1.00)
Chloride: 103 mmol/L (ref 101–111)
GFR calc Af Amer: >60 mL/min (ref >60)
GFR calc non Af Amer: >60 mL/min (ref >60)
GLUCOSE: 97 mg/dL (ref 65–99)
Potassium: 4.1 mmol/L (ref 3.5–5.1)
Sodium: 140 mmol/L (ref 135–145)

## 2015-08-29 LAB — GLUCOSE, CAPILLARY: Glucose-Capillary: 93 mg/dL (ref 65–99)

## 2015-08-29 NOTE — Progress Notes (Addendum)
Medical clearance noted on chart with EKG done 08-22-15- (no changes) PCP is Lonie PeakNathan Conroy, PA-C Denies ever seeing a cardiologist. Denies ever having a card cath, stress test, or echo. Reports her last A1C was 5.3, and watches her diet so DM is no longer a problem for her. She has a CBG machine at home, but doesn't check her blood sugar often.

## 2015-08-30 LAB — HEMOGLOBIN A1C
Hgb A1c MFr Bld: 5.8 % — ABNORMAL HIGH (ref 4.8–5.6)
Mean Plasma Glucose: 120 mg/dL

## 2015-09-02 MED ORDER — CLINDAMYCIN PHOSPHATE 900 MG/50ML IV SOLN: 900.0000 mg | INTRAVENOUS | Status: DC

## 2015-09-03 ENCOUNTER — Inpatient Hospital Stay (HOSPITAL_COMMUNITY): Payer: PPO | Admitting: Anesthesiology

## 2015-09-03 ENCOUNTER — Inpatient Hospital Stay (HOSPITAL_COMMUNITY)
Admission: RE | Admit: 2015-09-03 | Discharge: 2015-09-04 | DRG: 483 | Disposition: A | Discharge status: Home / Self Care | Payer: PPO | Source: Ambulatory Visit | Attending: Orthopedic Surgery | Admitting: Orthopedic Surgery

## 2015-09-03 ENCOUNTER — Encounter (HOSPITAL_COMMUNITY)
Admission: RE | Disposition: A | Discharge status: Home / Self Care | Payer: Self-pay | Source: Ambulatory Visit | Attending: Orthopedic Surgery

## 2015-09-03 ENCOUNTER — Encounter (HOSPITAL_COMMUNITY): Payer: Self-pay | Admitting: *Deleted

## 2015-09-03 ENCOUNTER — Inpatient Hospital Stay (HOSPITAL_COMMUNITY): Payer: PPO

## 2015-09-03 DIAGNOSIS — M75101 Unspecified rotator cuff tear or rupture of right shoulder, not specified as traumatic: Secondary | ICD-10-CM | POA: Diagnosis present

## 2015-09-03 DIAGNOSIS — Z96643 Presence of artificial hip joint, bilateral: Secondary | ICD-10-CM | POA: Diagnosis present

## 2015-09-03 DIAGNOSIS — G8918 Other acute postprocedural pain: Secondary | ICD-10-CM | POA: Diagnosis not present

## 2015-09-03 DIAGNOSIS — Z880 Allergy status to penicillin: Secondary | ICD-10-CM | POA: Diagnosis not present

## 2015-09-03 DIAGNOSIS — E039 Hypothyroidism, unspecified: Secondary | ICD-10-CM | POA: Diagnosis present

## 2015-09-03 DIAGNOSIS — I10 Essential (primary) hypertension: Secondary | ICD-10-CM | POA: Diagnosis present

## 2015-09-03 DIAGNOSIS — Z96619 Presence of unspecified artificial shoulder joint: Secondary | ICD-10-CM

## 2015-09-03 DIAGNOSIS — Z885 Allergy status to narcotic agent status: Secondary | ICD-10-CM

## 2015-09-03 DIAGNOSIS — Z96652 Presence of left artificial knee joint: Secondary | ICD-10-CM | POA: Diagnosis not present

## 2015-09-03 DIAGNOSIS — Z96611 Presence of right artificial shoulder joint: Secondary | ICD-10-CM

## 2015-09-03 DIAGNOSIS — M19011 Primary osteoarthritis, right shoulder: Principal | ICD-10-CM | POA: Diagnosis present

## 2015-09-03 DIAGNOSIS — M25511 Pain in right shoulder: Secondary | ICD-10-CM | POA: Diagnosis not present

## 2015-09-03 DIAGNOSIS — Z471 Aftercare following joint replacement surgery: Secondary | ICD-10-CM | POA: Diagnosis not present

## 2015-09-03 DIAGNOSIS — E119 Type 2 diabetes mellitus without complications: Secondary | ICD-10-CM | POA: Diagnosis present

## 2015-09-03 HISTORY — PX: REVERSE SHOULDER ARTHROPLASTY: SHX5054

## 2015-09-03 LAB — GLUCOSE, CAPILLARY: GLUCOSE-CAPILLARY: 83 mg/dL (ref 65–99)

## 2015-09-03 SURGERY — ARTHROPLASTY, SHOULDER, TOTAL, REVERSE
Anesthesia: General | Laterality: Right

## 2015-09-03 MED ORDER — ACETAMINOPHEN 325 MG PO TABS: 650.0000 mg | ORAL_TABLET | Freq: Four times a day (QID) | ORAL | Status: DC | PRN

## 2015-09-03 MED ORDER — NEOSTIGMINE METHYLSULFATE 5 MG/5ML IV SOSY
PREFILLED_SYRINGE | INTRAVENOUS | Status: AC
  Filled 2015-09-03: qty 5

## 2015-09-03 MED ORDER — LISINOPRIL 10 MG PO TABS
10.0000 mg | ORAL_TABLET | Freq: Every day | ORAL | Status: DC
  Administered 2015-09-03 – 2015-09-04 (×2): 10 mg via ORAL
  Filled 2015-09-03 (×2): qty 1

## 2015-09-03 MED ORDER — SODIUM CHLORIDE 0.9 % IR SOLN
Status: DC | PRN
  Administered 2015-09-03 (×2): 1000 mL

## 2015-09-03 MED ORDER — PROPOFOL 10 MG/ML IV BOLUS
INTRAVENOUS | Status: AC
  Filled 2015-09-03: qty 20

## 2015-09-03 MED ORDER — FENTANYL CITRATE (PF) 250 MCG/5ML IJ SOLN
INTRAMUSCULAR | Status: DC | PRN
  Administered 2015-09-03: 100 ug via INTRAVENOUS

## 2015-09-03 MED ORDER — LEVOTHYROXINE SODIUM 100 MCG PO TABS
100.0000 ug | ORAL_TABLET | Freq: Every day | ORAL | Status: DC
  Administered 2015-09-04: 100 ug via ORAL
  Filled 2015-09-03: qty 1

## 2015-09-03 MED ORDER — POLYETHYLENE GLYCOL 3350 17 G PO PACK: 17.0000 g | PACK | Freq: Every day | ORAL | Status: DC | PRN

## 2015-09-03 MED ORDER — EPHEDRINE SULFATE 50 MG/ML IJ SOLN
INTRAMUSCULAR | Status: DC | PRN
  Administered 2015-09-03: 15 mg via INTRAVENOUS
  Administered 2015-09-03 (×2): 5 mg via INTRAVENOUS
  Administered 2015-09-03: 15 mg via INTRAVENOUS
  Administered 2015-09-03: 5 mg via INTRAVENOUS
  Administered 2015-09-03: 10 mg via INTRAVENOUS
  Administered 2015-09-03: 5 mg via INTRAVENOUS

## 2015-09-03 MED ORDER — METOCLOPRAMIDE HCL 5 MG/ML IJ SOLN: 5.0000 mg | Freq: Three times a day (TID) | INTRAMUSCULAR | Status: DC | PRN

## 2015-09-03 MED ORDER — METOCLOPRAMIDE HCL 5 MG PO TABS: 5.0000 mg | ORAL_TABLET | Freq: Three times a day (TID) | ORAL | Status: DC | PRN

## 2015-09-03 MED ORDER — FENTANYL CITRATE (PF) 100 MCG/2ML IJ SOLN
INTRAMUSCULAR | Status: AC
  Administered 2015-09-03: 50 ug
  Filled 2015-09-03: qty 2

## 2015-09-03 MED ORDER — EPHEDRINE 5 MG/ML INJ
INTRAVENOUS | Status: AC
  Filled 2015-09-03: qty 10

## 2015-09-03 MED ORDER — CHLORHEXIDINE GLUCONATE 4 % EX LIQD: 60.0000 mL | Freq: Once | CUTANEOUS | Status: DC

## 2015-09-03 MED ORDER — BUPIVACAINE-EPINEPHRINE 0.25% -1:200000 IJ SOLN
INTRAMUSCULAR | Status: DC | PRN
  Administered 2015-09-03: 10 mL

## 2015-09-03 MED ORDER — GLYCOPYRROLATE 0.2 MG/ML IJ SOLN
INTRAMUSCULAR | Status: DC | PRN
  Administered 2015-09-03: 0.2 mg via INTRAVENOUS
  Administered 2015-09-03: .3 mg via INTRAVENOUS

## 2015-09-03 MED ORDER — BUPIVACAINE HCL (PF) 0.5 % IJ SOLN
INTRAMUSCULAR | Status: DC | PRN
  Administered 2015-09-03: 20 mL via PERINEURAL

## 2015-09-03 MED ORDER — LIDOCAINE HCL (CARDIAC) 20 MG/ML IV SOLN
INTRAVENOUS | Status: DC | PRN
  Administered 2015-09-03: 60 mg via INTRATRACHEAL

## 2015-09-03 MED ORDER — ONDANSETRON HCL 4 MG/2ML IJ SOLN
INTRAMUSCULAR | Status: AC
  Filled 2015-09-03: qty 2

## 2015-09-03 MED ORDER — HYDROCODONE-ACETAMINOPHEN 5-325 MG PO TABS: 1.0000 | ORAL_TABLET | Freq: Four times a day (QID) | ORAL | Status: DC | PRN

## 2015-09-03 MED ORDER — BUPIVACAINE-EPINEPHRINE (PF) 0.25% -1:200000 IJ SOLN
INTRAMUSCULAR | Status: AC
  Filled 2015-09-03: qty 30

## 2015-09-03 MED ORDER — CLINDAMYCIN PHOSPHATE 900 MG/50ML IV SOLN
INTRAVENOUS | Status: AC
  Administered 2015-09-03: 900 mg via INTRAVENOUS
  Filled 2015-09-03: qty 50

## 2015-09-03 MED ORDER — GLYCOPYRROLATE 0.2 MG/ML IV SOSY
PREFILLED_SYRINGE | INTRAVENOUS | Status: AC
  Filled 2015-09-03: qty 3

## 2015-09-03 MED ORDER — PROPOFOL 10 MG/ML IV BOLUS
INTRAVENOUS | Status: DC | PRN
  Administered 2015-09-03: 100 mg via INTRAVENOUS

## 2015-09-03 MED ORDER — HYDROCODONE-ACETAMINOPHEN 5-325 MG PO TABS
1.0000 | ORAL_TABLET | ORAL | Status: DC | PRN
  Administered 2015-09-03 – 2015-09-04 (×2): 1 via ORAL
  Filled 2015-09-03 (×2): qty 1

## 2015-09-03 MED ORDER — ROCURONIUM BROMIDE 50 MG/5ML IV SOLN
INTRAVENOUS | Status: AC
  Filled 2015-09-03: qty 1

## 2015-09-03 MED ORDER — METHOCARBAMOL 500 MG PO TABS: 500.0000 mg | ORAL_TABLET | Freq: Three times a day (TID) | ORAL | Status: DC | PRN

## 2015-09-03 MED ORDER — NEOSTIGMINE METHYLSULFATE 10 MG/10ML IV SOLN
INTRAVENOUS | Status: DC | PRN
  Administered 2015-09-03: 2 mg via INTRAVENOUS

## 2015-09-03 MED ORDER — METHOCARBAMOL 1000 MG/10ML IJ SOLN
500.0000 mg | Freq: Four times a day (QID) | INTRAVENOUS | Status: DC | PRN
  Filled 2015-09-03: qty 5

## 2015-09-03 MED ORDER — LIDOCAINE 2% (20 MG/ML) 5 ML SYRINGE
INTRAMUSCULAR | Status: AC
  Filled 2015-09-03: qty 5

## 2015-09-03 MED ORDER — MENTHOL 3 MG MT LOZG: 1.0000 | LOZENGE | OROMUCOSAL | Status: DC | PRN

## 2015-09-03 MED ORDER — DOCUSATE SODIUM 100 MG PO CAPS
100.0000 mg | ORAL_CAPSULE | Freq: Two times a day (BID) | ORAL | Status: DC
  Administered 2015-09-03 – 2015-09-04 (×2): 100 mg via ORAL
  Filled 2015-09-03 (×2): qty 1

## 2015-09-03 MED ORDER — PHENOL 1.4 % MT LIQD
1.0000 | OROMUCOSAL | Status: DC | PRN
Start: 2015-09-03 — End: 2015-09-04

## 2015-09-03 MED ORDER — ROCURONIUM BROMIDE 100 MG/10ML IV SOLN
INTRAVENOUS | Status: DC | PRN
  Administered 2015-09-03: 50 mg via INTRAVENOUS

## 2015-09-03 MED ORDER — BISACODYL 10 MG RE SUPP: 10.0000 mg | Freq: Every day | RECTAL | Status: DC | PRN

## 2015-09-03 MED ORDER — METHOCARBAMOL 500 MG PO TABS: 500.0000 mg | ORAL_TABLET | Freq: Four times a day (QID) | ORAL | Status: DC | PRN

## 2015-09-03 MED ORDER — FENTANYL CITRATE (PF) 250 MCG/5ML IJ SOLN
INTRAMUSCULAR | Status: AC
  Filled 2015-09-03: qty 5

## 2015-09-03 MED ORDER — CLINDAMYCIN PHOSPHATE 600 MG/50ML IV SOLN
600.0000 mg | Freq: Four times a day (QID) | INTRAVENOUS | Status: AC
  Administered 2015-09-03 – 2015-09-04 (×3): 600 mg via INTRAVENOUS
  Filled 2015-09-03 (×3): qty 50

## 2015-09-03 MED ORDER — MIDAZOLAM HCL 2 MG/2ML IJ SOLN
INTRAMUSCULAR | Status: AC
Start: 2015-09-03 — End: 2015-09-03
  Administered 2015-09-03: 1 mg
  Filled 2015-09-03: qty 2

## 2015-09-03 MED ORDER — FUROSEMIDE 20 MG PO TABS: 20.0000 mg | ORAL_TABLET | Freq: Every day | ORAL | Status: DC | PRN

## 2015-09-03 MED ORDER — FENTANYL CITRATE (PF) 100 MCG/2ML IJ SOLN: 25.0000 ug | INTRAMUSCULAR | Status: DC | PRN

## 2015-09-03 MED ORDER — ARTIFICIAL TEARS OP OINT
TOPICAL_OINTMENT | OPHTHALMIC | Status: AC
  Filled 2015-09-03: qty 3.5

## 2015-09-03 MED ORDER — ACETAMINOPHEN 650 MG RE SUPP: 650.0000 mg | Freq: Four times a day (QID) | RECTAL | Status: DC | PRN

## 2015-09-03 MED ORDER — ONDANSETRON HCL 4 MG/2ML IJ SOLN
INTRAMUSCULAR | Status: DC | PRN
  Administered 2015-09-03: 4 mg via INTRAVENOUS

## 2015-09-03 MED ORDER — SODIUM CHLORIDE 0.9 % IV SOLN
INTRAVENOUS | Status: DC
  Administered 2015-09-03: 19:00:00 via INTRAVENOUS

## 2015-09-03 MED ORDER — MIDAZOLAM HCL 2 MG/2ML IJ SOLN
INTRAMUSCULAR | Status: AC
  Filled 2015-09-03: qty 2

## 2015-09-03 MED ORDER — MORPHINE SULFATE (PF) 2 MG/ML IV SOLN: 1.0000 mg | INTRAVENOUS | Status: DC | PRN

## 2015-09-03 MED ORDER — ONDANSETRON HCL 4 MG/2ML IJ SOLN: 4.0000 mg | Freq: Four times a day (QID) | INTRAMUSCULAR | Status: DC | PRN

## 2015-09-03 MED ORDER — ONDANSETRON HCL 4 MG PO TABS: 4.0000 mg | ORAL_TABLET | Freq: Four times a day (QID) | ORAL | Status: DC | PRN

## 2015-09-03 MED ORDER — LACTATED RINGERS IV SOLN
INTRAVENOUS | Status: DC
  Administered 2015-09-03 (×2): via INTRAVENOUS

## 2015-09-03 SURGICAL SUPPLY — 73 items
BIT DRILL 170X2.5X (BIT) IMPLANT
BIT DRILL 5/64X5 DISP (BIT) ×3 IMPLANT
BIT DRL 170X2.5X (BIT)
BLADE SAG 18X100X1.27 (BLADE) ×3 IMPLANT
BLADE SAGITTAL 25.0X1.19X90 (BLADE) ×2 IMPLANT
BLADE SAGITTAL 25.0X1.19X90MM (BLADE) ×1
CAPT SHLDR REVTOTAL 1 ×3 IMPLANT
CEMENT BONE DEPUY (Cement) ×3 IMPLANT
CLOSURE WOUND 1/2 X4 (GAUZE/BANDAGES/DRESSINGS) ×1
COVER SURGICAL LIGHT HANDLE (MISCELLANEOUS) ×3 IMPLANT
DRAPE IMP U-DRAPE 54X76 (DRAPES) ×6 IMPLANT
DRAPE INCISE IOBAN 66X45 STRL (DRAPES) ×3 IMPLANT
DRAPE ORTHO SPLIT 77X108 STRL (DRAPES) ×6
DRAPE SURG ORHT 6 SPLT 77X108 (DRAPES) ×2 IMPLANT
DRAPE U-SHAPE 47X51 STRL (DRAPES) ×3 IMPLANT
DRAPE X-RAY CASS 24X20 (DRAPES) IMPLANT
DRILL 2.5 (BIT)
DRSG ADAPTIC 3X8 NADH LF (GAUZE/BANDAGES/DRESSINGS) ×3 IMPLANT
DRSG PAD ABDOMINAL 8X10 ST (GAUZE/BANDAGES/DRESSINGS) ×3 IMPLANT
DURAPREP 26ML APPLICATOR (WOUND CARE) ×3 IMPLANT
ELECT BLADE 4.0 EZ CLEAN MEGAD (MISCELLANEOUS) ×3
ELECT NEEDLE TIP 2.8 STRL (NEEDLE) ×3 IMPLANT
ELECT REM PT RETURN 9FT ADLT (ELECTROSURGICAL) ×3
ELECTRODE BLDE 4.0 EZ CLN MEGD (MISCELLANEOUS) ×1 IMPLANT
ELECTRODE REM PT RTRN 9FT ADLT (ELECTROSURGICAL) ×1 IMPLANT
GAUZE SPONGE 4X4 12PLY STRL (GAUZE/BANDAGES/DRESSINGS) ×3 IMPLANT
GLOVE BIOGEL PI ORTHO PRO 7.5 (GLOVE) ×2
GLOVE BIOGEL PI ORTHO PRO SZ8 (GLOVE) ×2
GLOVE ORTHO TXT STRL SZ7.5 (GLOVE) ×3 IMPLANT
GLOVE PI ORTHO PRO STRL 7.5 (GLOVE) ×1 IMPLANT
GLOVE PI ORTHO PRO STRL SZ8 (GLOVE) ×1 IMPLANT
GLOVE SURG ORTHO 8.5 STRL (GLOVE) ×3 IMPLANT
GOWN STRL REUS W/ TWL LRG LVL3 (GOWN DISPOSABLE) ×1 IMPLANT
GOWN STRL REUS W/ TWL XL LVL3 (GOWN DISPOSABLE) ×2 IMPLANT
GOWN STRL REUS W/TWL LRG LVL3 (GOWN DISPOSABLE) ×3
GOWN STRL REUS W/TWL XL LVL3 (GOWN DISPOSABLE) ×6
HANDPIECE INTERPULSE COAX TIP (DISPOSABLE)
KIT BASIN OR (CUSTOM PROCEDURE TRAY) ×3 IMPLANT
KIT ROOM TURNOVER OR (KITS) ×3 IMPLANT
MANIFOLD NEPTUNE II (INSTRUMENTS) ×3 IMPLANT
NEEDLE 1/2 CIR MAYO (NEEDLE) ×3 IMPLANT
NEEDLE HYPO 25GX1X1/2 BEV (NEEDLE) ×3 IMPLANT
NS IRRIG 1000ML POUR BTL (IV SOLUTION) ×6 IMPLANT
PACK SHOULDER (CUSTOM PROCEDURE TRAY) ×3 IMPLANT
PAD ABD 8X10 STRL (GAUZE/BANDAGES/DRESSINGS) ×3 IMPLANT
PAD ARMBOARD 7.5X6 YLW CONV (MISCELLANEOUS) ×6 IMPLANT
PIN METAGLENE 2.5 (PIN) IMPLANT
SET HNDPC FAN SPRY TIP SCT (DISPOSABLE) IMPLANT
SLING ARM LRG ADULT FOAM STRAP (SOFTGOODS) IMPLANT
SLING ARM MED ADULT FOAM STRAP (SOFTGOODS) IMPLANT
SPONGE GAUZE 4X4 12PLY STER LF (GAUZE/BANDAGES/DRESSINGS) ×3 IMPLANT
SPONGE LAP 18X18 X RAY DECT (DISPOSABLE) ×3 IMPLANT
SPONGE LAP 4X18 X RAY DECT (DISPOSABLE) ×3 IMPLANT
STRIP CLOSURE SKIN 1/2X4 (GAUZE/BANDAGES/DRESSINGS) ×2 IMPLANT
SUCTION FRAZIER HANDLE 10FR (MISCELLANEOUS) ×2
SUCTION TUBE FRAZIER 10FR DISP (MISCELLANEOUS) ×1 IMPLANT
SUT FIBERWIRE #2 38 T-5 BLUE (SUTURE) ×6
SUT MNCRL AB 4-0 PS2 18 (SUTURE) ×3 IMPLANT
SUT VIC AB 1 CT1 27 (SUTURE) ×3
SUT VIC AB 1 CT1 27XBRD ANBCTR (SUTURE) ×1 IMPLANT
SUT VIC AB 2-0 CT1 27 (SUTURE) ×3
SUT VIC AB 2-0 CT1 TAPERPNT 27 (SUTURE) ×1 IMPLANT
SUT VICRYL 0 CT 1 36IN (SUTURE) ×3 IMPLANT
SUTURE FIBERWR #2 38 T-5 BLUE (SUTURE) ×2 IMPLANT
SYR CONTROL 10ML LL (SYRINGE) ×3 IMPLANT
TAPE CLOTH SURG 6X10 WHT LF (GAUZE/BANDAGES/DRESSINGS) ×3 IMPLANT
TAPE STRIPS DRAPE STRL (GAUZE/BANDAGES/DRESSINGS) ×3 IMPLANT
TOWEL OR 17X24 6PK STRL BLUE (TOWEL DISPOSABLE) ×3 IMPLANT
TOWEL OR 17X26 10 PK STRL BLUE (TOWEL DISPOSABLE) ×3 IMPLANT
TOWER CARTRIDGE SMART MIX (DISPOSABLE) IMPLANT
TRAY FOLEY CATH 16FRSI W/METER (SET/KITS/TRAYS/PACK) IMPLANT
WATER STERILE IRR 1000ML POUR (IV SOLUTION) ×3 IMPLANT
YANKAUER SUCT BULB TIP NO VENT (SUCTIONS) ×3 IMPLANT

## 2015-09-03 NOTE — Brief Op Note (Signed)
09/03/2015  4:58 PM  PATIENT:  [name]  75 y.o. female  PRE-OPERATIVE DIAGNOSIS:  RIGHT SHOULDER OA ROTATOR CUFF INSUFFINCENCY  POST-OPERATIVE DIAGNOSIS:  RIGHT SHOULDER OA ROTATOR CUFF INSUFFINCENCY  PROCEDURE:  Procedure(s): RIGHT REVERSE SHOULDER ARTHROPLASTY (Right) DePuy Delta Xtend   SURGEON:  Surgeon(s) and Role:    * Beverely LowSteve Hevin Jeffcoat, MD - Primary  PHYSICIAN ASSISTANT:   ASSISTANTS: Thea Gisthomas B Dixon, PA-C   ANESTHESIA:   regional and general  EBL:  Total I/O In: 1200 [I.V.:1200] Out: 65 [Blood:65]  BLOOD ADMINISTERED:none  DRAINS: none   LOCAL MEDICATIONS USED:  MARCAINE     SPECIMEN:  No Specimen  DISPOSITION OF SPECIMEN:  N/A  COUNTS:  YES  TOURNIQUET:  * No tourniquets in log *  DICTATION: .Other Dictation: Dictation Number [id number]  PLAN OF CARE: Admit to inpatient   PATIENT DISPOSITION:  PACU - hemodynamically stable.   Delay start of Pharmacological VTE agent (>24hrs) due to surgical blood loss or risk of bleeding: not applicable

## 2015-09-03 NOTE — Discharge Instructions (Signed)
No heavy pushing or pulling or lifting with the right shoulder or arm.  Ice to the shoulder at all times  Keep the incision clean and dry and covered for one week, then ok to get it wet in the shower  Use the sling while out of the house to keep people away, in the house ok to remove it and use the shoulder for light activity.  Follow up in the office in two weeks [phone]

## 2015-09-03 NOTE — Anesthesia Preprocedure Evaluation (Addendum)
Anesthesia Evaluation  Patient identified by MRN, date of birth, ID band Patient awake    Reviewed: Allergy & Precautions, H&P , NPO status , Patient's Chart, lab work & pertinent test results  Airway Mallampati: II  TM Distance: >3 FB Neck ROM: full    Dental  (+) Dental Advisory Given, Edentulous Upper, Edentulous Lower   Pulmonary neg pulmonary ROS,    Pulmonary exam normal breath sounds clear to auscultation       Cardiovascular Exercise Tolerance: Good hypertension, Pt. on medications Normal cardiovascular exam Rhythm:regular Rate:Normal     Neuro/Psych negative neurological ROS  negative psych ROS   GI/Hepatic negative GI ROS, Neg liver ROS,   Endo/Other  diabetes, Well Controlled, Type 2Hypothyroidism Morbid obesityDiet controlled DM  Renal/GU negative Renal ROS  negative genitourinary   Musculoskeletal  (+) Arthritis ,   Abdominal   Peds  Hematology negative hematology ROS (+)   Anesthesia Other Findings   Reproductive/Obstetrics negative OB ROS                            Anesthesia Physical Anesthesia Plan  ASA: III  Anesthesia Plan: General   Post-op Pain Management: GA combined w/ Regional for post-op pain   Induction: Intravenous  Airway Management Planned: Oral ETT  Additional Equipment:   Intra-op Plan:   Post-operative Plan: Extubation in OR  Informed Consent: I have reviewed the patients History and Physical, chart, labs and discussed the procedure including the risks, benefits and alternatives for the proposed anesthesia with the patient or authorized representative who has indicated his/her understanding and acceptance.   Dental Advisory Given  Plan Discussed with: CRNA and Surgeon  Anesthesia Plan Comments:         Anesthesia Quick Evaluation

## 2015-09-03 NOTE — Anesthesia Procedure Notes (Addendum)
Anesthesia Regional Block:  Interscalene brachial plexus block  Pre-Anesthetic Checklist: ,, timeout performed, Correct Patient, Correct Site, Correct Laterality, Correct Procedure, Correct Position, site marked, Risks and benefits discussed,  Surgical consent,  Pre-op evaluation,  At surgeon's request and post-op pain management  Laterality: Right  Prep: chloraprep       Needles:  Injection technique: Single-shot  Needle Type: Echogenic Needle     Needle Length: 9cm 9 cm Needle Gauge: 21 and 21 G    Additional Needles:  Procedures: ultrasound guided (picture in chart) Interscalene brachial plexus block Narrative:  Start time: 09/03/2015 1:43 PM End time: 09/03/2015 1:53 PM Injection made incrementally with aspirations every 5 mL.  Performed by: Personally  Anesthesiologist: Ronelle NighEWELL, CHARLES  Additional Notes: Patient tolerated the procedure well without complications   Procedure Name: Intubation Date/Time: 09/03/2015 2:23 PM Performed by: Salomon MastWALL, Oseas Detty COREY Pre-anesthesia Checklist: Patient identified, Emergency Drugs available, Suction available and Patient being monitored Patient Re-evaluated:Patient Re-evaluated prior to inductionOxygen Delivery Method: Circle system utilized Preoxygenation: Pre-oxygenation with 100% oxygen Intubation Type: IV induction Ventilation: Oral airway inserted - appropriate to patient size Laryngoscope Size: Mac and 3 Grade View: Grade I Tube type: Oral Tube size: 7.0 mm Number of attempts: 1 Airway Equipment and Method: Stylet Placement Confirmation: ETT inserted through vocal cords under direct vision,  positive ETCO2 and breath sounds checked- equal and bilateral Secured at: 23 cm Dental Injury: Teeth and Oropharynx as per pre-operative assessment

## 2015-09-03 NOTE — Transfer of Care (Signed)
Immediate Anesthesia Transfer of Care Note  Patient: [name]  Procedure(s) Performed: Procedure(s): RIGHT REVERSE SHOULDER ARTHROPLASTY (Right)  Patient Location: PACU  Anesthesia Type:General  Level of Consciousness: awake, alert , oriented and patient cooperative  Airway & Oxygen Therapy: Patient Spontanous Breathing and Patient connected to nasal cannula oxygen  Post-op Assessment: Report given to RN, Post -op Vital signs reviewed and stable and Patient moving all extremities  Post vital signs: Reviewed and stable  Last Vitals:  Filed Vitals:   09/03/15 1312 09/03/15 1315  BP: 183/51   Pulse: 43 44  Temp:    Resp: 17 14    Last Pain: There were no vitals filed for this visit.       Complications: No apparent anesthesia complications

## 2015-09-03 NOTE — Interval H&P Note (Signed)
History and Physical Interval Note:  09/03/2015 1:35 PM  [name]  has presented today for surgery, with the diagnosis of RIGHT SHOULDER OA ROTATOR CUFF INSUFFINCENCY  The various methods of treatment have been discussed with the patient and family. After consideration of risks, benefits and other options for treatment, the patient has consented to  Procedure(s): RIGHT REVERSE SHOULDER ARTHROPLASTY (Right) as a surgical intervention .  The patient's history has been reviewed, patient examined, no change in status, stable for surgery.  I have reviewed the patient's chart and labs.  Questions were answered to the patient's satisfaction.     Dani Wallner,STEVEN R

## 2015-09-04 ENCOUNTER — Encounter (HOSPITAL_COMMUNITY): Payer: Self-pay | Admitting: Orthopedic Surgery

## 2015-09-04 LAB — HEMOGLOBIN AND HEMATOCRIT, BLOOD
HEMATOCRIT: 28.5 % — AB (ref 36.0–46.0)
Hemoglobin: 9.1 g/dL — ABNORMAL LOW (ref 12.0–15.0)

## 2015-09-04 LAB — GLUCOSE, CAPILLARY
GLUCOSE-CAPILLARY: 172 mg/dL — AB (ref 65–99)
GLUCOSE-CAPILLARY: 90 mg/dL (ref 65–99)
Glucose-Capillary: 91 mg/dL (ref 65–99)

## 2015-09-04 LAB — BASIC METABOLIC PANEL
Anion gap: 7 (ref 5–15)
BUN: 13 mg/dL (ref 6–20)
CALCIUM: 8.4 mg/dL — AB (ref 8.9–10.3)
CO2: 26 mmol/L (ref 22–32)
CREATININE: 0.8 mg/dL (ref 0.44–1.00)
Chloride: 103 mmol/L (ref 101–111)
Glucose, Bld: 110 mg/dL — ABNORMAL HIGH (ref 65–99)
Potassium: 4.6 mmol/L (ref 3.5–5.1)
SODIUM: 136 mmol/L (ref 135–145)

## 2015-09-04 NOTE — Progress Notes (Signed)
Occupational Therapy Evaluation Patient Details Name: [name] MRN: [id number] DOB: May 19, 1940 Today's Date: 09/04/2015    History of Present Illness s/p R reverse TSA with subscap repair   Clinical Impression   Completed education with pt and attempted to complete education with daughter. Daughter stated she "went to school for this and knew what to do ".  Stressed importance of No pulling/pushing/lifting with RUE as pt stated she could take her sling off in "3 days and use it". Emphasized to only use RUE for "gentle ADL", elbow ROM as educated and to wear her sling at all times with the exception of ADL and exercise until otherwise indicated by Dr. Ranell Patrick. Written information given to pt. Pt to follow up with Dr. Ranell Patrick and progress rehab as indicated at that time. Pt ready to D/C when medically stable.     Follow Up Recommendations  Supervision/Assistance - 24 hour    Equipment Recommendations  None recommended by OT    Recommendations for Other Services       Precautions / Restrictions Precautions Precautions: Shoulder Type of Shoulder Precautions: conservation. No ROM shoulder. only elbow ROM "gentle ADL" Precaution Booklet Issued: Yes (comment) Required Braces or Orthoses: Sling Restrictions Weight Bearing Restrictions: Yes RUE Weight Bearing: Non weight bearing      Mobility Bed Mobility Overal bed mobility: Modified Independent             General bed mobility comments: use of rails. plans to sleep in recliner  Transfers Overall transfer level: Needs assistance   Transfers: Sit to/from Stand Sit to Stand: Min guard              Balance Overall balance assessment: Needs assistance           Standing balance-Leahy Scale: Fair                              ADL Overall ADL's : Needs assistance/impaired                                     Functional mobility during ADLs: Minimal assistance (unsteady due to  meds) General ADL Comments: see shoulder section below     Vision     Perception     Praxis      Pertinent Vitals/Pain Pain Assessment: Faces Faces Pain Scale: Hurts a little bit Pain Location: R shoulder Pain Descriptors / Indicators: Aching Pain Intervention(s): Limited activity within patient's tolerance;Repositioned     Hand Dominance     Extremity/Trunk Assessment Upper Extremity Assessment Upper Extremity Assessment: RUE deficits/detail RUE Deficits / Details: elbow/wrist/hand ROM WFL. no shoulder ROM RUE Coordination: decreased gross motor   Lower Extremity Assessment Lower Extremity Assessment:  (s/p TKA)   Cervical / Trunk Assessment Cervical / Trunk Assessment: Normal   Communication     Cognition Arousal/Alertness: Awake/alert Behavior During Therapy: WFL for tasks assessed/performed Overall Cognitive Status: Within Functional Limits for tasks assessed                     General Comments       Exercises Exercises: Shoulder     Shoulder Instructions Shoulder Instructions Donning/doffing shirt without moving shoulder: Caregiver independent with task Method for sponge bathing under operated UE: Caregiver independent with task Donning/doffing sling/immobilizer: Caregiver independent with task Correct positioning of sling/immobilizer: Caregiver independent with task  ROM for elbow, wrist and digits of operated UE: Supervision/safety Sling wearing schedule (on at all times/off for ADL's): Supervision/safety Proper positioning of operated UE when showering: Supervision/safety Positioning of UE while sleeping: Supervision/safety    Home Living Family/patient expects to be discharged to:: Private residence Living Arrangements: Alone Available Help at Discharge: Family;Available 24 hours/day Type of Home: Mobile home Home Access: Stairs to enter Entrance Stairs-Number of Steps: 4 Entrance Stairs-Rails: Right;Left Home Layout: One level      Bathroom Shower/Tub: Producer, television/film/videoWalk-in shower   Bathroom Toilet: Handicapped height Bathroom Accessibility: Yes How Accessible: Accessible via walker Home Equipment: Walker - 2 wheels;Bedside commode;Grab bars - tub/shower;Grab bars - toilet          Prior Functioning/Environment Level of Independence: Independent             OT Diagnosis: Generalized weakness;Acute pain   OT Problem List: Decreased strength;Decreased range of motion;Decreased knowledge of precautions;Impaired UE functional use;Pain   OT Treatment/Interventions:      OT Goals(Current goals can be found in the care plan section) Acute Rehab OT Goals Patient Stated Goal: to go home OT Goal Formulation: All assessment and education complete, DC therapy  OT Frequency:     Barriers to D/C:            Co-evaluation              End of Session Nurse Communication: Mobility status  Activity Tolerance: Patient tolerated treatment well Patient left: in chair;with call bell/phone within reach;with family/visitor present   Time: [id number] OT Time Calculation (min): 40 min Charges:  OT General Charges $OT Visit: 1 Procedure OT Evaluation $OT Eval Moderate Complexity: 1 Procedure OT Treatments $Self Care/Home Management : 8-22 mins $Therapeutic Exercise: 8-22 mins G-Codes:    Elizabelle Fite,HILLARY 09/04/2015, 9:28 AM   Luisa DagoHilary Toriano Aikey, OTR/L  [phone] 09/04/2015

## 2015-09-04 NOTE — Progress Notes (Signed)
Discharge instructions RX's and follow up appt reviewed/provided to patient verbalized understanding. Patient left floor via wheelchair accompanied by volunteers. No c/o pain or shortness of breath at d/c.  Dorthula Bier, Kae HellerMiranda Lynn, RN

## 2015-09-04 NOTE — Op Note (Signed)
[name], [name]            ACCOUNT NO.:  [id number]  MEDICAL RECORD NO.:  [id number]  LOCATION:  [id number]                        FACILITY:  [facility]  PHYSICIAN:  Almedia BallsSteven R. Ranell PatrickNorris, M.D. DATE OF BIRTH:  Oct 01, 1940  DATE OF PROCEDURE:  09/03/2015 DATE OF DISCHARGE:                              OPERATIVE REPORT   PREOPERATIVE DIAGNOSIS:  Right shoulder osteoarthritis and rotator cuff insufficiency.  POSTOPERATIVE DIAGNOSIS:  Right shoulder osteoarthritis and rotator cuff insufficiency.  PROCEDURE PERFORMED:  Right shoulder reverse total shoulder arthroplasty using DePuy Delta Xtend prosthesis.  ATTENDING SURGEON:  Almedia BallsSteven R. Ranell PatrickNorris, M.D.  ASSISTANT:  Donnie Coffinhomas B. Dixon, P.A.-C., who scrubbed the entire procedure and necessary for satisfactory completion of surgery.  ANESTHESIA:  General anesthesia was used plus interscalene block.  ESTIMATED BLOOD LOSS:  Was less than 100 mL.  FLUID REPLACEMENT:  1200 mL of crystalloid.  INSTRUMENT COUNTS:  Correct.  COMPLICATIONS:  There were no complications.  ANTIBIOTICS:  Perioperative antibiotics were given.  INDICATIONS:  The patient is a 75 year old female with worsening right shoulder pain and dysfunction secondary to severe OA with profound functional loss secondary to rotator cuff insufficiency.  The patient has had progressive pain despite conservative management and interference with ADLs and sleep.  The patient now presents desiring total shoulder arthroplasty to restore function and relieve pain to the shoulder.  Informed consent obtained.  DESCRIPTION OF PROCEDURE:  After an adequate level of anesthesia, the patient was positioned in modified beach chair position.  Right shoulder correctly identified and sterilely prepped and draped in usual manner. Time-out was called.  We entered the shoulder in standard deltopectoral approach starting at coracoid process extending down to the anterior humerus.  Dissection down through the  subcutaneous tissues using the Bovie, we identified the cephalic vein and took it laterally with the deltoid and pectoralis was taken medially.  Identified the conjoined tendon retracted that medially.  We then placed our deep retractors and did a subperiosteal removal of the subscapularis off the less tuberosity.  We tagged that for repair at the end with #2 FiberWire suture in a modified Mason-Allen suture technique.  We did an inferior capsular release progressively externally rotating the humerus. Tenodesed the biceps using a 0 Vicryl figure-of-eight suture through the pectoralis tendon insertion.  We then went ahead and released the biceps and supraspinatus tendon which was quite scarred and just did not appear to be normal tendinous tissue.  We released all the way back through the infraspinatus and removed that tendon back to the myotendinous junction, so it would not be bulky soft tissue impingement for the reverse replacement.  We then went ahead and delivered the humeral head out of the wound, entered the proximal humerus with a 6 mm reamer, reamed up to a size 12.  We used size 12 intramedullary resection guide and resected the humeral head with the resection guide 10 degrees retroverted.  Once we made that resection, removed excess osteophytes off the humeral side. We then went ahead and did the reaming for the size 1 epi 1 right metaphysis.  We reamed and then impacted the 12 body epi 1 right set on the 0 setting placed in 10 degrees of retroversion  into the humerus.  We had good proximal fit and fill.  We went ahead and then subluxed the humerus posteriorly, did a 360-degree capsular removal, and glenoid labral removal as well as the biceps.  We noted there to be multiple loose bodies in the shoulder which were evacuated and removed.  We freed up the subscapularis so it could balance easily and be repaired.  We then went ahead and found our center point for metaglene, drilled  that guide pin, reamed for the metaglene, done our peripheral hand reaming and then drilled peg hole out.  We impacted the metaglene in position, placed a 42 lock screw inferiorly and 36 into the base of the coracoid, and then 18 nonlocked screws anterior and posterior.  Excellent screw purchase.  Really nice placement of the metaglene.  We then selected a 38 eccentric glenosphere and then impacted and screwed that in position angling the centricity inferiorly.  Axillary nerve was protected during entire procedure and was checked to make sure it was not anywhere near that metaglene glenosphere construct.  We then went and trialed with a 38+ 3 standard poly insert and reduced the shoulder, felt like we could definitely fit a +6, maybe +9.  Removed the trial components from the humeral side, irrigated thoroughly, placed #2 FiberWire suture in the subscapularis footprint through drill holes.  Given the patient having a pretty significant osteoporotic bone as we were discovered during the surgery, we selected to do a hybrid pattern.  We used a HA stem, but we put a little cement down the canal, so we used 1 pack of DePuy 1 cement mixed that, dried the canal, and placed the cement in by hand, and then placed the HA coated stem and so we really had HA to bone proximally and then the stem cemented distally for security fixation.  Once we had that cement set up, we trialed with a +6 and +9, felt like the +9 was the best fit.  So, we selected the 38 +9 poly, impacted that in position, reduced the shoulder with a nice little pop.  Conjoined was nice and tight, but not too tight, and the axillary nerve was not under too much tension.  We were able to get the patient's elbow at the waist and the arm lying flat on the stomach, really not too tight.  We then did an anatomic subscapularis repair back to bone with sutures through the bone tunnels and also suturing directly to the humerus there with the  #2 FiberWire.  We had a very strong repair.  We irrigated thoroughly that did not limit excursion at all, we could externally rotate still to 30 degrees, no problem and away without any type of abnormal tension being placed on the shoulder.  Nice stable construct, negative sulcus, negative gapping with external rotation, negative inferior sulcus, and everything moved together as a unit when I pulled on the arm.  We then irrigated again and closed the deltopectoral interval with 0 Vicryl suture, followed by 2-0 Vicryl subcutaneous closure, and 4-0 Monocryl for skin.  Steri-Strips applied followed by sterile dressing.  The patient tolerated the surgery well.     Almedia Balls. Ranell Patrick, M.D.     SRN/MEDQ  D:  09/03/2015  T:  09/04/2015  Job:  [id number]

## 2015-09-04 NOTE — Progress Notes (Signed)
Orthopedics Progress Note  Subjective: I feel fine this morning  Objective:  Filed Vitals:   09/03/15 2059 09/04/15 0100  BP: 105/32 95/32  Pulse: 53 50  Temp: 97.6 F (36.4 C) 98.5 F (36.9 C)  Resp: 18 17    General: Awake and alert  Musculoskeletal: right shoulder dressing clean and dry and intact, changed to gel bandage Neurovascularly intact  Lab Results  Component Value Date   WBC 5.4 08/29/2015   HGB 11.5* 08/29/2015   HCT 35.2* 08/29/2015   MCV 83.2 08/29/2015   PLT 199 08/29/2015       Component Value Date/Time   NA 140 08/29/2015 1437   K 4.1 08/29/2015 1437   CL 103 08/29/2015 1437   CO2 28 08/29/2015 1437   GLUCOSE 97 08/29/2015 1437   BUN 10 08/29/2015 1437   CREATININE 0.78 08/29/2015 1437   CALCIUM 9.4 08/29/2015 1437   GFRNONAA >60 08/29/2015 1437   GFRAA >60 08/29/2015 1437    Lab Results  Component Value Date   INR 1.04 03/28/2015   INR 0.98 02/13/2015    Assessment/Plan: POD #1 s/p Procedure(s): RIGHT REVERSE SHOULDER ARTHROPLASTY Stable this morning. Discharge planned after therapy.  Ok to do very gentle ADLs. No pushing out of a chair with the right arm.(subscap repaired)  Almedia BallsSteven R. Ranell PatrickNorris, MD 09/04/2015 7:36 AM

## 2015-09-04 NOTE — Discharge Summary (Signed)
  Physician Discharge Summary   Patient ID: Sandra McalpineShirley J Figueroa MRN: [id number] DOB/AGE: 1940-05-14 75 y.o.  Admit date: 09/03/2015 Discharge date: 09/04/2015  Admission Diagnoses:  Active Problems:   S/P shoulder replacement   Discharge Diagnoses:  Same   Surgeries: Procedure(s): RIGHT REVERSE SHOULDER ARTHROPLASTY on 09/03/2015   Consultants: OT  Discharged Condition: Stable  Hospital Course: [name] is an 75 y.o. female who was admitted 09/03/2015 with a chief complaint of right shoulder pain, and found to have a diagnosis of right shoulder rotator cuff tear arthropathy.  They were brought to the operating room on 09/03/2015 and underwent the above named procedures.    The patient had an uncomplicated hospital course and was stable for discharge.  Recent vital signs:  Filed Vitals:   09/04/15 0100 09/04/15 0500  BP: 95/32 111/55  Pulse: 50 62  Temp: 98.5 F (36.9 C) 98 F (36.7 C)  Resp: 17 17    Recent laboratory studies:  Results for orders placed or performed during the hospital encounter of 09/03/15  Glucose, capillary  Result Value Ref Range   Glucose-Capillary 83 65 - 99 mg/dL    Discharge Medications:     Medication List    TAKE these medications        acetaminophen 325 MG tablet  Commonly known as:  TYLENOL  Take 650 mg by mouth every 6 (six) hours as needed for moderate pain.     furosemide 20 MG tablet  Commonly known as:  LASIX  Take 20 mg by mouth daily as needed for fluid.     HYDROcodone-acetaminophen 5-325 MG tablet  Commonly known as:  NORCO  Take 1 tablet by mouth every 6 (six) hours as needed for moderate pain.     levothyroxine 100 MCG tablet  Commonly known as:  SYNTHROID, LEVOTHROID  Take 100 mcg by mouth daily before breakfast.     lisinopril 10 MG tablet  Commonly known as:  PRINIVIL,ZESTRIL  Take 10 mg by mouth daily.     methocarbamol 500 MG tablet  Commonly known as:  ROBAXIN  Take 1 tablet (500 mg total) by  mouth 3 (three) times daily as needed.        Diagnostic Studies: Dg Shoulder Right Port  09/03/2015  CLINICAL DATA:  Status post right total shoulder arthroplasty. Initial encounter. EXAM: PORTABLE RIGHT SHOULDER - 2+ VIEW COMPARISON:  None. FINDINGS: The patient's right shoulder arthroplasty is grossly unremarkable in appearance, though the right scapula is not well assessed on provided images. The underlying glenoid is difficult to fully assess. The right acromioclavicular joint is unremarkable. Scattered surrounding postoperative air is noted. Vascular congestion is noted, with underlying atelectasis. IMPRESSION: Right shoulder arthroplasty is grossly unremarkable in appearance, though the scapula and glenoid are not well assessed. Electronically Signed   By: Roanna RaiderJeffery  Chang M.D.   On: 09/03/2015 19:10    Disposition: 06-Home-Health Care Svc        Follow-up Information    Follow up with Von Quintanar,STEVEN R, MD. Call in 2 weeks.   Specialty:  Orthopedic Surgery   Why:  [phone]   Contact information:   [address] [place] [address] [phone]        Signed: Verlee RossettiORRIS,STEVEN R 09/04/2015, 7:39 AM

## 2015-09-04 NOTE — Anesthesia Postprocedure Evaluation (Signed)
Anesthesia Post Note  Patient: Sandra McalpineShirley J Figueroa  Procedure(s) Performed: Procedure(s) (LRB): RIGHT REVERSE SHOULDER ARTHROPLASTY (Right)  Patient location during evaluation: PACU Anesthesia Type: General and Regional Level of consciousness: awake and alert Pain management: pain level controlled Vital Signs Assessment: post-procedure vital signs reviewed and stable Respiratory status: spontaneous breathing, nonlabored ventilation, respiratory function stable and patient connected to nasal cannula oxygen Cardiovascular status: blood pressure returned to baseline and stable Postop Assessment: no signs of nausea or vomiting Anesthetic complications: no    Last Vitals:  Filed Vitals:   09/04/15 0100 09/04/15 0500  BP: 95/32 111/46  Pulse: 50 62  Temp: 36.9 C 36.7 C  Resp: 17 17    Last Pain:  Filed Vitals:   09/04/15 1110  PainSc: 5                  Raja Caputi,JAMES TERRILL

## 2015-09-04 NOTE — Progress Notes (Signed)
Utilization review completed.  

## 2015-09-22 DIAGNOSIS — Z96611 Presence of right artificial shoulder joint: Secondary | ICD-10-CM | POA: Diagnosis not present

## 2015-09-22 DIAGNOSIS — Z471 Aftercare following joint replacement surgery: Secondary | ICD-10-CM | POA: Diagnosis not present

## 2015-09-22 DIAGNOSIS — M19011 Primary osteoarthritis, right shoulder: Secondary | ICD-10-CM | POA: Diagnosis not present

## 2015-10-13 DIAGNOSIS — Z471 Aftercare following joint replacement surgery: Secondary | ICD-10-CM | POA: Diagnosis not present

## 2015-10-13 DIAGNOSIS — Z96652 Presence of left artificial knee joint: Secondary | ICD-10-CM | POA: Diagnosis not present

## 2015-10-28 DIAGNOSIS — Z471 Aftercare following joint replacement surgery: Secondary | ICD-10-CM | POA: Diagnosis not present

## 2015-10-28 DIAGNOSIS — Z96643 Presence of artificial hip joint, bilateral: Secondary | ICD-10-CM | POA: Diagnosis not present

## 2015-11-20 DIAGNOSIS — Z471 Aftercare following joint replacement surgery: Secondary | ICD-10-CM | POA: Diagnosis not present

## 2015-11-20 DIAGNOSIS — Z96611 Presence of right artificial shoulder joint: Secondary | ICD-10-CM | POA: Diagnosis not present

## 2016-06-08 DIAGNOSIS — M19032 Primary osteoarthritis, left wrist: Secondary | ICD-10-CM | POA: Diagnosis not present

## 2016-06-08 DIAGNOSIS — M19031 Primary osteoarthritis, right wrist: Secondary | ICD-10-CM | POA: Diagnosis not present

## 2016-06-08 DIAGNOSIS — M25431 Effusion, right wrist: Secondary | ICD-10-CM | POA: Diagnosis not present

## 2016-06-08 DIAGNOSIS — M25532 Pain in left wrist: Secondary | ICD-10-CM | POA: Diagnosis not present

## 2016-06-08 DIAGNOSIS — M25432 Effusion, left wrist: Secondary | ICD-10-CM | POA: Diagnosis not present

## 2016-06-08 DIAGNOSIS — M25531 Pain in right wrist: Secondary | ICD-10-CM | POA: Diagnosis not present

## 2016-07-20 DIAGNOSIS — E119 Type 2 diabetes mellitus without complications: Secondary | ICD-10-CM | POA: Diagnosis not present

## 2016-07-20 DIAGNOSIS — I10 Essential (primary) hypertension: Secondary | ICD-10-CM | POA: Diagnosis not present

## 2016-07-20 DIAGNOSIS — Z1389 Encounter for screening for other disorder: Secondary | ICD-10-CM | POA: Diagnosis not present

## 2016-07-20 DIAGNOSIS — Z6827 Body mass index (BMI) 27.0-27.9, adult: Secondary | ICD-10-CM | POA: Diagnosis not present

## 2016-07-20 DIAGNOSIS — E039 Hypothyroidism, unspecified: Secondary | ICD-10-CM | POA: Diagnosis not present

## 2016-07-20 DIAGNOSIS — Z9181 History of falling: Secondary | ICD-10-CM | POA: Diagnosis not present

## 2016-07-20 DIAGNOSIS — R609 Edema, unspecified: Secondary | ICD-10-CM | POA: Diagnosis not present

## 2016-07-22 DIAGNOSIS — N8182 Incompetence or weakening of pubocervical tissue: Secondary | ICD-10-CM | POA: Diagnosis not present

## 2016-08-05 NOTE — Patient Instructions (Signed)
Your procedure is scheduled on:  Tuesday, Aug 17, 2016  Enter through the [employer] of [facility] at:  6:00 AM  Pick up the phone at the desk and dial [phone].  Call this number if you have problems the morning of surgery: [phone].  Remember: Do NOT eat food or drink after: Midnight Monday  Take these medicines the morning of surgery with a SIP OF WATER:  Lisinopril, Levothyroxine  Stop ALL herbal medications at this time  Do NOT smoke the day of surgery.  Do NOT wear jewelry (body piercing), metal hair clips/bobby pins, make-up, or nail polish. Do NOT wear lotions, powders, or perfumes.  You may wear deodorant. Do NOT shave for 48 hours prior to surgery. Do NOT bring valuables to the hospital. Contacts, dentures, or bridgework may not be worn into surgery.  Leave suitcase in car.  After surgery it may be brought to your room.  For patients admitted to the hospital, checkout time is 11:00 AM the day of discharge.  Bring a copy of your healthcare power of attorney and living will documents.

## 2016-08-06 ENCOUNTER — Encounter (HOSPITAL_COMMUNITY)
Admission: RE | Admit: 2016-08-06 | Discharge: 2016-08-06 | Disposition: A | Discharge status: Home / Self Care | Payer: PPO | Source: Ambulatory Visit | Attending: Obstetrics and Gynecology | Admitting: Obstetrics and Gynecology

## 2016-08-06 ENCOUNTER — Other Ambulatory Visit: Payer: Self-pay

## 2016-08-06 ENCOUNTER — Encounter (HOSPITAL_COMMUNITY): Payer: Self-pay

## 2016-08-06 DIAGNOSIS — R9431 Abnormal electrocardiogram [ECG] [EKG]: Secondary | ICD-10-CM | POA: Diagnosis not present

## 2016-08-06 DIAGNOSIS — Z01810 Encounter for preprocedural cardiovascular examination: Secondary | ICD-10-CM | POA: Insufficient documentation

## 2016-08-06 DIAGNOSIS — Z01812 Encounter for preprocedural laboratory examination: Secondary | ICD-10-CM | POA: Insufficient documentation

## 2016-08-06 LAB — COMPREHENSIVE METABOLIC PANEL
ALBUMIN: 4.1 g/dL (ref 3.5–5.0)
ALT: 13 U/L — ABNORMAL LOW (ref 14–54)
AST: 25 U/L (ref 15–41)
Alkaline Phosphatase: 136 U/L — ABNORMAL HIGH (ref 38–126)
Anion gap: 8 (ref 5–15)
BUN: 16 mg/dL (ref 6–20)
CHLORIDE: 102 mmol/L (ref 101–111)
CO2: 26 mmol/L (ref 22–32)
Calcium: 9.2 mg/dL (ref 8.9–10.3)
Creatinine, Ser: 0.75 mg/dL (ref 0.44–1.00)
GFR calc Af Amer: >60 mL/min (ref >60)
Glucose, Bld: 100 mg/dL — ABNORMAL HIGH (ref 65–99)
POTASSIUM: 4.1 mmol/L (ref 3.5–5.1)
SODIUM: 136 mmol/L (ref 135–145)
Total Bilirubin: 0.6 mg/dL (ref 0.3–1.2)
Total Protein: 7.5 g/dL (ref 6.5–8.1)

## 2016-08-06 LAB — CBC
HCT: 39.8 % (ref 36.0–46.0)
Hemoglobin: 13.7 g/dL (ref 12.0–15.0)
MCH: 29.1 pg (ref 26.0–34.0)
MCHC: 34.4 g/dL (ref 30.0–36.0)
MCV: 84.7 fL (ref 78.0–100.0)
PLATELETS: 185 10*3/uL (ref 150–400)
RBC: 4.7 MIL/uL (ref 3.87–5.11)
RDW: 13.6 % (ref 11.5–15.5)
WBC: 6.3 10*3/uL (ref 4.0–10.5)

## 2016-08-06 LAB — ABO/RH: ABO/RH(D): O POS

## 2016-08-06 LAB — TYPE AND SCREEN
ABO/RH(D): O POS
Antibody Screen: NEGATIVE

## 2016-08-11 DIAGNOSIS — N8189 Other female genital prolapse: Secondary | ICD-10-CM | POA: Diagnosis not present

## 2016-08-16 NOTE — H&P (Signed)
Sandra McalpineShirley J Figueroa is an 76 y.o. female who is uncomfortable with pelvic prolapse. No difficulty voiding or with bowel movement.  Pertinent Gynecological History: Menses: post-menopausal Bleeding: N/A Contraception: none DES exposure: denies Blood transfusions: none Sexually transmitted diseases: no past history Previous GYN Procedures: none  Last mammogram: unknown Date: N/A Last pap: unknown Date: N/A OB History: G5, P4   Menstrual History: Menarche age: unknown No LMP recorded. Patient is postmenopausal.    Past Medical History:  Diagnosis Date  . Arthritis   . Diabetes mellitus without complication (HCC)    hx of 2006 no longer on meds   . Hypertension   . Hypothyroidism   . Thyroid disease     Past Surgical History:  Procedure Laterality Date  . APPENDECTOMY    . bilateral cataract surgery     . CHOLECYSTECTOMY    . REVERSE SHOULDER ARTHROPLASTY Right 09/03/2015   Procedure: RIGHT REVERSE SHOULDER ARTHROPLASTY;  Surgeon: Sandra LowSteve Norris, MD;  [facility];  Service: Orthopedics;  Laterality: Right;  . THYROIDECTOMY    . TONSILLECTOMY    . TOTAL HIP ARTHROPLASTY     bilateral  . TOTAL KNEE ARTHROPLASTY Left 03/28/2015   Procedure: LEFT TOTAL KNEE ARTHROPLASTY;  Surgeon: Sandra Mcalpineobert Collins, MD;  Location: [facility] ORS;  Service: Orthopedics;  Laterality: Left;  . TUBAL LIGATION    . URETHRAL DILATION      Family History  Problem Relation Age of Onset  . CAD Neg Hx   . Diabetes Mellitus II Neg Hx     Social History:  reports that she has never smoked. She has never used smokeless tobacco. She reports that she does not drink alcohol or use drugs.  Allergies:  Allergies  Allergen Reactions  . Penicillins Swelling    UNSPECIFIED SWELLING. Has patient had reaction causing immediate rash, facial/tongue/throat swelling, SOB or lightheadedness, hypotension: No Reaction causing severe rash involving mucus membranes or skin necrosis: No Has patient had a PCN reaction that  required hospitalization No Has patient had a PCN reaction occurring within the last 10 years: No If all of the above answers are "NO", then may proceed with Cephalosporin use.   Marland Kitchen. Oxycodone Other (See Comments)    Patient Preference  Makes her feel "weird" and she prefers not to take it    No prescriptions prior to admission.    ROS  There were no vitals taken for this visit. Physical Exam  Cardiovascular: Normal rate and regular rhythm.   Respiratory: Effort normal and breath sounds normal.  GI: Soft. There is no tenderness.  Genitourinary:  Genitourinary Comments: Uterus small and very mobile Adnexa without masses Anterior vaginal wall at vaginal introitus    No results found for this or any previous visit (from the past 24 hour(s)).  No results found.  Assessment/Plan: 76 yo with symptomatic pelvic prolapse LAVH/BSO/A&P repair/possible SSLS D/W patient Risks reviewed including infection, organ damage, bleeding/transfusion-HIV/Hep, DVT/PE, pneumonia, fistula, pelvic pain, laparotomy, recurrent prolapse. Patient states she understands and agrees  [facility],[name] E 08/16/2016, 5:18 PM

## 2016-08-17 ENCOUNTER — Observation Stay (HOSPITAL_COMMUNITY)
Admission: RE | Admit: 2016-08-17 | Discharge: 2016-08-18 | Disposition: A | Discharge status: Home / Self Care | Payer: PPO | Source: Ambulatory Visit | Attending: Obstetrics and Gynecology | Admitting: Obstetrics and Gynecology

## 2016-08-17 ENCOUNTER — Encounter (HOSPITAL_COMMUNITY)
Admission: RE | Disposition: A | Discharge status: Home / Self Care | Payer: Self-pay | Source: Ambulatory Visit | Attending: Obstetrics and Gynecology

## 2016-08-17 ENCOUNTER — Inpatient Hospital Stay (HOSPITAL_COMMUNITY): Payer: PPO | Admitting: Anesthesiology

## 2016-08-17 ENCOUNTER — Encounter (HOSPITAL_COMMUNITY): Payer: Self-pay | Admitting: *Deleted

## 2016-08-17 DIAGNOSIS — N8111 Cystocele, midline: Secondary | ICD-10-CM

## 2016-08-17 DIAGNOSIS — N8110 Cystocele, unspecified: Secondary | ICD-10-CM | POA: Insufficient documentation

## 2016-08-17 DIAGNOSIS — K660 Peritoneal adhesions (postprocedural) (postinfection): Secondary | ICD-10-CM | POA: Diagnosis not present

## 2016-08-17 DIAGNOSIS — E890 Postprocedural hypothyroidism: Secondary | ICD-10-CM | POA: Insufficient documentation

## 2016-08-17 DIAGNOSIS — I10 Essential (primary) hypertension: Secondary | ICD-10-CM | POA: Diagnosis not present

## 2016-08-17 DIAGNOSIS — Z79899 Other long term (current) drug therapy: Secondary | ICD-10-CM | POA: Insufficient documentation

## 2016-08-17 DIAGNOSIS — N8189 Other female genital prolapse: Secondary | ICD-10-CM | POA: Diagnosis not present

## 2016-08-17 DIAGNOSIS — Z96643 Presence of artificial hip joint, bilateral: Secondary | ICD-10-CM | POA: Diagnosis not present

## 2016-08-17 DIAGNOSIS — N819 Female genital prolapse, unspecified: Secondary | ICD-10-CM | POA: Diagnosis present

## 2016-08-17 DIAGNOSIS — E119 Type 2 diabetes mellitus without complications: Secondary | ICD-10-CM | POA: Insufficient documentation

## 2016-08-17 DIAGNOSIS — N840 Polyp of corpus uteri: Secondary | ICD-10-CM | POA: Diagnosis not present

## 2016-08-17 DIAGNOSIS — Z96611 Presence of right artificial shoulder joint: Secondary | ICD-10-CM | POA: Insufficient documentation

## 2016-08-17 DIAGNOSIS — E039 Hypothyroidism, unspecified: Secondary | ICD-10-CM | POA: Diagnosis not present

## 2016-08-17 HISTORY — PX: LAPAROSCOPIC VAGINAL HYSTERECTOMY WITH SALPINGO OOPHORECTOMY: SHX6681

## 2016-08-17 HISTORY — PX: ANTERIOR AND POSTERIOR REPAIR WITH SACROSPINOUS FIXATION: SHX6536

## 2016-08-17 LAB — GLUCOSE, CAPILLARY
Glucose-Capillary: 103 mg/dL — ABNORMAL HIGH (ref 65–99)
Glucose-Capillary: 152 mg/dL — ABNORMAL HIGH (ref 65–99)

## 2016-08-17 SURGERY — HYSTERECTOMY, VAGINAL, LAPAROSCOPY-ASSISTED, WITH SALPINGO-OOPHORECTOMY
Anesthesia: General | Site: Abdomen

## 2016-08-17 MED ORDER — SOD CITRATE-CITRIC ACID 500-334 MG/5ML PO SOLN
ORAL | Status: AC
  Filled 2016-08-17: qty 15

## 2016-08-17 MED ORDER — DEXAMETHASONE SODIUM PHOSPHATE 10 MG/ML IJ SOLN
INTRAMUSCULAR | Status: DC | PRN
Start: 2016-08-17 — End: 2016-08-17
  Administered 2016-08-17: 4 mg via INTRAVENOUS

## 2016-08-17 MED ORDER — DIPHENHYDRAMINE HCL 12.5 MG/5ML PO ELIX
12.5000 mg | ORAL_SOLUTION | Freq: Four times a day (QID) | ORAL | Status: DC | PRN
  Filled 2016-08-17: qty 5

## 2016-08-17 MED ORDER — HYDROMORPHONE HCL 1 MG/ML IJ SOLN: 0.2500 mg | INTRAMUSCULAR | Status: DC | PRN

## 2016-08-17 MED ORDER — LISINOPRIL 10 MG PO TABS
10.0000 mg | ORAL_TABLET | Freq: Every day | ORAL | Status: DC
  Administered 2016-08-18: 10 mg via ORAL
  Filled 2016-08-17 (×2): qty 1

## 2016-08-17 MED ORDER — LACTATED RINGERS IV SOLN
INTRAVENOUS | Status: DC
  Administered 2016-08-17: 12:00:00 via INTRAVENOUS

## 2016-08-17 MED ORDER — ONDANSETRON HCL 4 MG PO TABS: 4.0000 mg | ORAL_TABLET | Freq: Four times a day (QID) | ORAL | Status: DC | PRN

## 2016-08-17 MED ORDER — FENTANYL CITRATE (PF) 250 MCG/5ML IJ SOLN
INTRAMUSCULAR | Status: AC
  Filled 2016-08-17: qty 5

## 2016-08-17 MED ORDER — LACTATED RINGERS IV SOLN
INTRAVENOUS | Status: DC
  Administered 2016-08-17 (×2): via INTRAVENOUS
  Administered 2016-08-17: 125 mL/h via INTRAVENOUS

## 2016-08-17 MED ORDER — LIDOCAINE HCL (CARDIAC) 20 MG/ML IV SOLN
INTRAVENOUS | Status: DC | PRN
  Administered 2016-08-17: 40 mg via INTRAVENOUS

## 2016-08-17 MED ORDER — ROCURONIUM BROMIDE 100 MG/10ML IV SOLN
INTRAVENOUS | Status: DC | PRN
  Administered 2016-08-17: 30 mg via INTRAVENOUS
  Administered 2016-08-17: 10 mg via INTRAVENOUS

## 2016-08-17 MED ORDER — IBUPROFEN 600 MG PO TABS
600.0000 mg | ORAL_TABLET | Freq: Four times a day (QID) | ORAL | Status: DC | PRN
  Filled 2016-08-17: qty 1

## 2016-08-17 MED ORDER — MIDAZOLAM HCL 5 MG/5ML IJ SOLN
INTRAMUSCULAR | Status: DC | PRN
  Administered 2016-08-17: 1 mg via INTRAVENOUS

## 2016-08-17 MED ORDER — LIDOCAINE-EPINEPHRINE 0.5 %-1:200000 IJ SOLN
INTRAMUSCULAR | Status: DC | PRN
  Administered 2016-08-17: 7 mL
  Administered 2016-08-17: 5 mL

## 2016-08-17 MED ORDER — TRAMADOL HCL 50 MG PO TABS: 50.0000 mg | ORAL_TABLET | Freq: Four times a day (QID) | ORAL | Status: DC | PRN

## 2016-08-17 MED ORDER — DEXAMETHASONE SODIUM PHOSPHATE 10 MG/ML IJ SOLN
INTRAMUSCULAR | Status: AC
  Filled 2016-08-17: qty 1

## 2016-08-17 MED ORDER — SOD CITRATE-CITRIC ACID 500-334 MG/5ML PO SOLN
30.0000 mL | ORAL | Status: AC
  Administered 2016-08-17: 30 mL via ORAL

## 2016-08-17 MED ORDER — FENTANYL CITRATE (PF) 100 MCG/2ML IJ SOLN
INTRAMUSCULAR | Status: AC
  Filled 2016-08-17: qty 2

## 2016-08-17 MED ORDER — GLYCOPYRROLATE 0.2 MG/ML IJ SOLN
INTRAMUSCULAR | Status: AC
Start: 2016-08-17 — End: ?
  Filled 2016-08-17: qty 4

## 2016-08-17 MED ORDER — BUPIVACAINE HCL (PF) 0.5 % IJ SOLN
INTRAMUSCULAR | Status: AC
  Filled 2016-08-17: qty 30

## 2016-08-17 MED ORDER — ONDANSETRON HCL 4 MG/2ML IJ SOLN
INTRAMUSCULAR | Status: AC
  Filled 2016-08-17: qty 2

## 2016-08-17 MED ORDER — ONDANSETRON HCL 4 MG/2ML IJ SOLN
INTRAMUSCULAR | Status: DC | PRN
  Administered 2016-08-17: 4 mg via INTRAVENOUS

## 2016-08-17 MED ORDER — MENTHOL 3 MG MT LOZG: 1.0000 | LOZENGE | OROMUCOSAL | Status: DC | PRN

## 2016-08-17 MED ORDER — ONDANSETRON HCL 4 MG/2ML IJ SOLN: 4.0000 mg | Freq: Four times a day (QID) | INTRAMUSCULAR | Status: DC | PRN

## 2016-08-17 MED ORDER — LIDOCAINE-EPINEPHRINE 0.5 %-1:200000 IJ SOLN
INTRAMUSCULAR | Status: AC
Start: 2016-08-17 — End: ?
  Filled 2016-08-17: qty 1

## 2016-08-17 MED ORDER — ONDANSETRON HCL 4 MG/2ML IJ SOLN
4.0000 mg | Freq: Four times a day (QID) | INTRAMUSCULAR | Status: DC | PRN
  Administered 2016-08-17: 4 mg via INTRAVENOUS

## 2016-08-17 MED ORDER — BUPIVACAINE HCL (PF) 0.5 % IJ SOLN
INTRAMUSCULAR | Status: DC | PRN
  Administered 2016-08-17: 4 mL

## 2016-08-17 MED ORDER — SENNA 8.6 MG PO TABS
1.0000 | ORAL_TABLET | Freq: Two times a day (BID) | ORAL | Status: DC
  Administered 2016-08-17 – 2016-08-18 (×2): 8.6 mg via ORAL
  Filled 2016-08-17 (×4): qty 1

## 2016-08-17 MED ORDER — FUROSEMIDE 20 MG PO TABS
20.0000 mg | ORAL_TABLET | Freq: Every day | ORAL | Status: DC | PRN
  Filled 2016-08-17: qty 1

## 2016-08-17 MED ORDER — SODIUM CHLORIDE 0.9% FLUSH: 9.0000 mL | INTRAVENOUS | Status: DC | PRN

## 2016-08-17 MED ORDER — LEVOTHYROXINE SODIUM 100 MCG PO TABS
100.0000 ug | ORAL_TABLET | Freq: Every day | ORAL | Status: DC
  Administered 2016-08-18: 100 ug via ORAL
  Filled 2016-08-17 (×2): qty 1

## 2016-08-17 MED ORDER — DIPHENHYDRAMINE HCL 50 MG/ML IJ SOLN: 12.5000 mg | Freq: Four times a day (QID) | INTRAMUSCULAR | Status: DC | PRN

## 2016-08-17 MED ORDER — MAGNESIUM HYDROXIDE 400 MG/5ML PO SUSP: 30.0000 mL | Freq: Every day | ORAL | Status: DC | PRN

## 2016-08-17 MED ORDER — LACTATED RINGERS IR SOLN
Status: DC | PRN
  Administered 2016-08-17: 3000 mL

## 2016-08-17 MED ORDER — SODIUM CHLORIDE 0.9 % IJ SOLN
INTRAMUSCULAR | Status: AC
  Filled 2016-08-17: qty 10

## 2016-08-17 MED ORDER — SUGAMMADEX SODIUM 200 MG/2ML IV SOLN
INTRAVENOUS | Status: AC
  Filled 2016-08-17: qty 2

## 2016-08-17 MED ORDER — GENTAMICIN SULFATE 40 MG/ML IJ SOLN
INTRAVENOUS | Status: AC
  Administered 2016-08-17: 113 mL via INTRAVENOUS
  Filled 2016-08-17: qty 7

## 2016-08-17 MED ORDER — PROPOFOL 10 MG/ML IV BOLUS
INTRAVENOUS | Status: AC
  Filled 2016-08-17: qty 20

## 2016-08-17 MED ORDER — SODIUM CHLORIDE 0.9 % IJ SOLN
INTRAMUSCULAR | Status: DC | PRN
  Administered 2016-08-17: 10 mL

## 2016-08-17 MED ORDER — SIMETHICONE 80 MG PO CHEW: 80.0000 mg | CHEWABLE_TABLET | Freq: Four times a day (QID) | ORAL | Status: DC | PRN

## 2016-08-17 MED ORDER — LIDOCAINE HCL (CARDIAC) 20 MG/ML IV SOLN
INTRAVENOUS | Status: AC
  Filled 2016-08-17: qty 5

## 2016-08-17 MED ORDER — ESTRADIOL 0.1 MG/GM VA CREA
TOPICAL_CREAM | VAGINAL | Status: DC | PRN
  Administered 2016-08-17: 1 via VAGINAL

## 2016-08-17 MED ORDER — ESTRADIOL 0.1 MG/GM VA CREA
TOPICAL_CREAM | VAGINAL | Status: AC
  Filled 2016-08-17: qty 42.5

## 2016-08-17 MED ORDER — HYDROMORPHONE 1 MG/ML IV SOLN
INTRAVENOUS | Status: DC
  Administered 2016-08-17: 12:00:00 via INTRAVENOUS
  Administered 2016-08-17: 0.2 mg via INTRAVENOUS
  Filled 2016-08-17: qty 25

## 2016-08-17 MED ORDER — PANTOPRAZOLE SODIUM 40 MG PO TBEC
40.0000 mg | DELAYED_RELEASE_TABLET | Freq: Every day | ORAL | Status: DC
  Filled 2016-08-17: qty 1

## 2016-08-17 MED ORDER — NALOXONE HCL 0.4 MG/ML IJ SOLN: 0.4000 mg | INTRAMUSCULAR | Status: DC | PRN

## 2016-08-17 MED ORDER — MIDAZOLAM HCL 2 MG/2ML IJ SOLN
INTRAMUSCULAR | Status: AC
  Filled 2016-08-17: qty 2

## 2016-08-17 MED ORDER — PROPOFOL 10 MG/ML IV BOLUS
INTRAVENOUS | Status: DC | PRN
  Administered 2016-08-17: 100 mg via INTRAVENOUS

## 2016-08-17 MED ORDER — FENTANYL CITRATE (PF) 250 MCG/5ML IJ SOLN
INTRAMUSCULAR | Status: DC | PRN
  Administered 2016-08-17 (×7): 50 ug via INTRAVENOUS

## 2016-08-17 MED ORDER — ACETAMINOPHEN 10 MG/ML IV SOLN
1000.0000 mg | Freq: Once | INTRAVENOUS | Status: AC
  Administered 2016-08-17: 1000 mg via INTRAVENOUS
  Filled 2016-08-17: qty 100

## 2016-08-17 MED ORDER — SUGAMMADEX SODIUM 200 MG/2ML IV SOLN
INTRAVENOUS | Status: DC | PRN
  Administered 2016-08-17: 140 mg via INTRAVENOUS

## 2016-08-17 MED ORDER — NEOSTIGMINE METHYLSULFATE 10 MG/10ML IV SOLN
INTRAVENOUS | Status: AC
  Filled 2016-08-17: qty 1

## 2016-08-17 MED ORDER — DEXAMETHASONE SODIUM PHOSPHATE 4 MG/ML IJ SOLN
INTRAMUSCULAR | Status: AC
  Filled 2016-08-17: qty 1

## 2016-08-17 MED ORDER — SODIUM CHLORIDE 0.9 % IJ SOLN
INTRAMUSCULAR | Status: AC
  Filled 2016-08-17: qty 100

## 2016-08-17 SURGICAL SUPPLY — 70 items
ADH SKN CLS APL DERMABOND .7 (GAUZE/BANDAGES/DRESSINGS) ×2
BLADE SURG 11 STRL SS (BLADE) IMPLANT
BLADE SURG 15 STRL LF C SS BP (BLADE) ×2 IMPLANT
BLADE SURG 15 STRL SS (BLADE) ×4
CABLE HIGH FREQUENCY MONO STRZ (ELECTRODE) IMPLANT
CATH ROBINSON RED A/P 16FR (CATHETERS) ×4 IMPLANT
CLOTH BEACON ORANGE TIMEOUT ST (SAFETY) ×4 IMPLANT
CONT PATH 16OZ SNAP LID 3702 (MISCELLANEOUS) ×4 IMPLANT
COVER BACK TABLE 60X90IN (DRAPES) ×4 IMPLANT
DECANTER SPIKE VIAL GLASS SM (MISCELLANEOUS) ×4 IMPLANT
DERMABOND ADVANCED (GAUZE/BANDAGES/DRESSINGS) ×2
DERMABOND ADVANCED .7 DNX12 (GAUZE/BANDAGES/DRESSINGS) ×2 IMPLANT
DEVICE CAPIO SLIM SINGLE (INSTRUMENTS) IMPLANT
DISSECTOR SPONGE CHERRY (GAUZE/BANDAGES/DRESSINGS) IMPLANT
DRSG OPSITE POSTOP 3X4 (GAUZE/BANDAGES/DRESSINGS) IMPLANT
DURAPREP 26ML APPLICATOR (WOUND CARE) ×4 IMPLANT
ELECT REM PT RETURN 9FT ADLT (ELECTROSURGICAL)
ELECTRODE REM PT RTRN 9FT ADLT (ELECTROSURGICAL) IMPLANT
FILTER SMOKE EVAC LAPAROSHD (FILTER) IMPLANT
GAUZE PACKING 1 X5 YD ST (GAUZE/BANDAGES/DRESSINGS) ×4 IMPLANT
GAUZE SPONGE 4X4 16PLY XRAY LF (GAUZE/BANDAGES/DRESSINGS) ×4 IMPLANT
GLOVE BIO SURGEON STRL SZ8 (GLOVE) ×8 IMPLANT
GLOVE BIOGEL PI IND STRL 6.5 (GLOVE) ×2 IMPLANT
GLOVE BIOGEL PI IND STRL 7.0 (GLOVE) ×4 IMPLANT
GLOVE BIOGEL PI IND STRL 8 (GLOVE) ×2 IMPLANT
GLOVE BIOGEL PI INDICATOR 6.5 (GLOVE) ×2
GLOVE BIOGEL PI INDICATOR 7.0 (GLOVE) ×4
GLOVE BIOGEL PI INDICATOR 8 (GLOVE) ×2
GOWN STRL REUS W/ TWL XL LVL3 (GOWN DISPOSABLE) ×4 IMPLANT
GOWN STRL REUS W/TWL LRG LVL3 (GOWN DISPOSABLE) ×16 IMPLANT
GOWN STRL REUS W/TWL XL LVL3 (GOWN DISPOSABLE) ×8
LEGGING LITHOTOMY PAIR STRL (DRAPES) ×4 IMPLANT
LIGASURE IMPACT 36 18CM CVD LR (INSTRUMENTS) ×4 IMPLANT
NEEDLE HYPO 22GX1.5 SAFETY (NEEDLE) IMPLANT
NEEDLE HYPO 25X5/8 SAFETYGLIDE (NEEDLE) ×4 IMPLANT
NEEDLE INSUFFLATION 120MM (ENDOMECHANICALS) ×4 IMPLANT
NEEDLE MAYO CATGUT SZ4 (NEEDLE) ×4 IMPLANT
NS IRRIG 1000ML POUR BTL (IV SOLUTION) ×4 IMPLANT
PACK LAVH (CUSTOM PROCEDURE TRAY) ×4 IMPLANT
PACK ROBOTIC GOWN (GOWN DISPOSABLE) ×4 IMPLANT
PACK TRENDGUARD 450 HYBRID PRO (MISCELLANEOUS) IMPLANT
PACK TRENDGUARD 600 HYBRD PROC (MISCELLANEOUS) IMPLANT
PACK VAGINAL WOMENS (CUSTOM PROCEDURE TRAY) ×4 IMPLANT
PLUG CATH AND CAP STER (CATHETERS) IMPLANT
PROTECTOR NERVE ULNAR (MISCELLANEOUS) ×8 IMPLANT
SCISSORS LAP 5X45 EPIX DISP (ENDOMECHANICALS) IMPLANT
SEALER TISSUE G2 CVD JAW 45CM (ENDOMECHANICALS) ×4 IMPLANT
SET CYSTO W/LG BORE CLAMP LF (SET/KITS/TRAYS/PACK) ×4 IMPLANT
SET IRRIG TUBING LAPAROSCOPIC (IRRIGATION / IRRIGATOR) ×4 IMPLANT
SLEEVE XCEL OPT CAN 5 100 (ENDOMECHANICALS) IMPLANT
SUT CAPIO POLYGLYCOLIC (SUTURE) IMPLANT
SUT MNCRL 0 MO-4 VIOLET 18 CR (SUTURE) ×4 IMPLANT
SUT MNCRL 0 VIOLET 6X18 (SUTURE) ×2 IMPLANT
SUT MON AB 2-0 CT1 27 (SUTURE) ×8 IMPLANT
SUT MONOCRYL 0 6X18 (SUTURE) ×2
SUT MONOCRYL 0 MO 4 18  CR/8 (SUTURE) ×4
SUT PLAIN 2 0 (SUTURE)
SUT PLAIN ABS 2-0 54XMFL TIE (SUTURE) IMPLANT
SUT VIC AB 2-0 CT2 27 (SUTURE) ×12 IMPLANT
SUT VIC AB 2-0 UR6 27 (SUTURE) ×4 IMPLANT
SUT VIC AB 4-0 PS2 27 (SUTURE) ×4 IMPLANT
SUT VICRYL 0 UR6 27IN ABS (SUTURE) ×4 IMPLANT
SYR 30ML LL (SYRINGE) IMPLANT
TOWEL OR 17X24 6PK STRL BLUE (TOWEL DISPOSABLE) ×8 IMPLANT
TRAY FOLEY CATH SILVER 14FR (SET/KITS/TRAYS/PACK) ×4 IMPLANT
TRENDGUARD 450 HYBRID PRO PACK (MISCELLANEOUS)
TRENDGUARD 600 HYBRID PROC PK (MISCELLANEOUS)
TROCAR XCEL NON-BLD 11X100MML (ENDOMECHANICALS) ×4 IMPLANT
TROCAR XCEL NON-BLD 5MMX100MML (ENDOMECHANICALS) ×4 IMPLANT
WARMER LAPAROSCOPE (MISCELLANEOUS) ×4 IMPLANT

## 2016-08-17 NOTE — Anesthesia Preprocedure Evaluation (Signed)
Anesthesia Evaluation  Patient identified by MRN, date of birth, ID band Patient awake    Reviewed: Allergy & Precautions, H&P , NPO status , Patient's Chart, lab work & pertinent test results  Airway Mallampati: II   Neck ROM: full    Dental   Pulmonary neg pulmonary ROS,    breath sounds clear to auscultation       Cardiovascular hypertension,  Rhythm:regular Rate:Normal     Neuro/Psych    GI/Hepatic   Endo/Other  diabetes, Type 2Hypothyroidism   Renal/GU      Musculoskeletal  (+) Arthritis ,   Abdominal   Peds  Hematology   Anesthesia Other Findings   Reproductive/Obstetrics                             Anesthesia Physical Anesthesia Plan  ASA: II  Anesthesia Plan: General   Post-op Pain Management:    Induction: Intravenous  Airway Management Planned: Oral ETT  Additional Equipment:   Intra-op Plan:   Post-operative Plan: Extubation in OR  Informed Consent: I have reviewed the patients History and Physical, chart, labs and discussed the procedure including the risks, benefits and alternatives for the proposed anesthesia with the patient or authorized representative who has indicated his/her understanding and acceptance.     Plan Discussed with: CRNA, Anesthesiologist and Surgeon  Anesthesia Plan Comments:         Anesthesia Quick Evaluation

## 2016-08-17 NOTE — Op Note (Signed)
NAME:  [name], [name]                  ACCOUNT NO.:  MEDICAL RECORD NO.:  [id number]  LOCATION:                                 FACILITY:  PHYSICIAN:  Guy Sandifer. Henderson Cloud, M.D.      DATE OF BIRTH:  DATE OF PROCEDURE:  08/17/2016 DATE OF DISCHARGE:                              OPERATIVE REPORT   PREOPERATIVE DIAGNOSIS:  Pelvic prolapse.  POSTOPERATIVE DIAGNOSIS:  Pelvic prolapse.  PROCEDURES:  Laparoscopically-assisted vaginal hysterectomy with bilateral salpingo-oophorectomy anterior-posterior vaginal repair and cystoscopy.  SURGEON:  Guy Sandifer. Henderson Cloud, M.D.  ASSISTANT:  Belva Agee, MD.  ANESTHESIA:  General with endotracheal intubation.  ESTIMATED BLOOD LOSS:  150 mL.  SPECIMENS:  Uterus, bilateral fallopian tubes, and ovaries to Pathology.  INDICATIONS AND CONSENT:  This patient is a 76 year old, multiparous patient with symptomatic pelvic prolapse.  Details are dictated in history and physical.  LAVH with BSO, A and P repair, and possible sacrospinous ligament suspension have been discussed preoperatively. Potential risks and complications were discussed preoperatively including, but not limited to, infection, organ damage, bleeding requiring transfusion of blood products with HIV and hepatitis acquisition, DVT, PE, pneumonia, fistula formation, pelvic pain, abdominal pain, laparotomy, and recurrence of prolapse.  The patient states she understands and agrees and consent was signed on the chart.  FINDINGS:  There was a veil of omental adhesions in the right upper quadrant to the anterior abdominal wall.  In the pelvis, there were some thin adhesions from the vesicouterine peritoneum to the right round ligament.  Otherwise, the posterior cul-de-sac was clear.  Ovaries are atrophic bilaterally, but normal in appearance.  DESCRIPTION OF PROCEDURE:  The patient was taken to the operating room. She was identified, placed in dorsal supine position, and general anesthesia  was induced via endotracheal intubation.  She was placed in dorsal lithotomy position.  A Hulka tenaculum was placed in the uterus as a manipulator.  She was straight catheterized.  She was then draped in a sterile fashion.  Time-out was undertaken.  The infraumbilical and suprapubic areas were injected in the midline with approximately 4 mL of 0.5% plain Marcaine.  A small infraumbilical incision was made and disposable Veress needle was placed on the first attempt without difficulty.  Good syringe and drop test were noted and 2 L of gas was then insufflated under low pressure with good tympany in the right upper quadrant.  Veress needle was removed and a 10/11 Xcel bladeless disposable trocar sleeve was placed using direct visualization with the diagnostic scope.  After placement, the operative scope was used.  Small suprapubic incision was made in midline and a 5-mm disposable trocar sleeve was placed under direct visualization without difficulty.  The above findings were noted.  The adhesions to the right round ligament were taken down in a simple fashion using the EnSeal bipolar cautery cutting instrument.  Then, using the same EnSeal, the right infundibulopelvic ligament was taken down coming across, below the fallopian tube across the proximal ligaments and then down to the vesicouterine peritoneum.  Similar procedure was carried out on the left side.  Vesicouterine peritoneum was taken down cephalad laterally.  Good hemostasis was noted.  Instruments and suprapubic trocar sleeve were removed and attention was turned to the vagina.  Posterior cul-de-sac was entered sharply and the cervix was circumscribed with unipolar cautery.  Mucosa was advanced sharply and bluntly.  Then, using the LigaSure bipolar cautery cutting instrument, the uterosacral ligaments were taken down bilaterally followed by the bladder pillars, cardinal ligaments, and uterine vessels.  Anterior cul-de-sac was  taken down as well and the remaining ligaments were taken down and the specimen was delivered without difficulty.  All suture will be 0 Monocryl unless otherwise designated.  Uterosacral ligaments were plicated the cuff bilaterally and plicated the midline with a third suture.  Posterior half of the cuff was closed with figure-of-eights.  The anterior vaginal mucosa was then injected with 0.5% lidocaine with 1:200,000 epinephrine which has been diluted in saline.  The anterior vaginal mucosa was taken down in the midline to a point about 2 cm below the urethral meatus. This was then widely dissected sharply and bluntly.  The cystocele was then reduced with pursestring sutures and then the cystocele was further reduced with interrupted figure-of-eights, elevating the bladder well. Excess mucosa was trimmed.  The anterior half of the vaginal cuff was closed and then the anterior vaginal mucosa was closed in a running locking fashion with a 2-0 Vicryl suture.  Good hemostasis was noted. The posterior perineal body was incised in the midline and then the posterior vaginal mucosa was injected with the same dilute solution of lidocaine.  The posterior vaginal mucosa was then taken down in the midline about 2/3rd the length of the posterior vaginal wall.  The mucosa was then dissected bilaterally sharply and bluntly.  It was not felt that a sacrospinous ligament suspension would be of benefit. Therefore, the rectovaginal fascia was reapproximated in the midline with interrupted 0 Monocryl sutures.  Excess mucosa was trimmed and the posterior mucosa was closed in running locking fashion with 2-0 Vicryl suture and the remainder was closed in a standard episiotomy type fashion.  Cystoscopy was then done and a 360-degree inspection reveals no foreign bodies.  No trauma to the mucosa and a good puff of urine bilaterally from the ureters.  A Foley catheter was placed.  The vagina was then packed with  vaginal packing with Estrace cream.  Attention was returned to the abdomen.  Pneumoperitoneum was recreated.  The suprapubic trocar sleeve was reintroduced under direct visualization and minor bleeders were controlled with bipolar cautery.  The remaining Marcaine was then injected in the peritoneal cavity and after inspection, assuring hemostasis, pneumoperitoneum was reduced and the trocar sleeves were removed.  The umbilical incision was closed with 0 Vicryl in the fascia under good visualization.  The skin on both was closed with interrupted 4-0 Vicryl.  Dermabond was placed.  All counts were correct.  The patient was awakened, taken to the recovery room in stable condition.     Guy SandiferJames E. Henderson Cloudomblin, M.D.     JET/MEDQ  D:  08/17/2016  T:  08/17/2016  Job:  [id number]

## 2016-08-17 NOTE — Progress Notes (Signed)
Ambulating well, + flatus, good pain control  VSS Afeb UO clear Lungs CTA Cor RRR Abdomen soft, +BS LE PAS on  A/P: stable         Per orders

## 2016-08-17 NOTE — Progress Notes (Signed)
No changes to H&P per patient history Reviewed with patient procedure-LAVH/BSO/A&P repair/SSLS All questions answered Patient states she understands and agrees 

## 2016-08-17 NOTE — Transfer of Care (Signed)
Immediate Anesthesia Transfer of Care Note  Patient: Eugenia McalpineShirley J Rosero  Procedure(s) Performed: Procedure(s): LAPAROSCOPIC ASSISTED VAGINAL HYSTERECTOMY WITH SALPINGO OOPHORECTOMY (Bilateral) ANTERIOR AND POSTERIOR REPAIR WITH SACROSPINOUS FIXATION (N/A)  Patient Location: PACU  Anesthesia Type:General  Level of Consciousness: sedated  Airway & Oxygen Therapy: Patient Spontanous Breathing and Patient connected to nasal cannula oxygen  Post-op Assessment: Report given to RN and Post -op Vital signs reviewed and stable  Post vital signs: stable  Last Vitals:  Vitals:   08/17/16 0623 08/17/16 0940  BP: 137/70   Pulse: 64 (!) (P) 48  Resp: 16 (P) 12  Temp: 36.5 C (P) 36.3 C    Last Pain:  Vitals:   08/17/16 0623  TempSrc: Oral      Patients Stated Pain Goal: 3 (08/17/16 [id number])  Complications: No apparent anesthesia complications

## 2016-08-17 NOTE — Anesthesia Procedure Notes (Signed)
Procedure Name: Intubation Date/Time: 08/17/2016 7:28 AM Performed by: [name], [name] L Pre-anesthesia Checklist: Patient identified, Emergency Drugs available, Suction available and Patient being monitored Patient Re-evaluated:Patient Re-evaluated prior to inductionOxygen Delivery Method: Circle system utilized Preoxygenation: Pre-oxygenation with 100% oxygen Intubation Type: IV induction Ventilation: Mask ventilation without difficulty Laryngoscope Size: Miller and 2 Grade View: Grade I Tube type: Oral Tube size: 7.0 mm Number of attempts: 1 Airway Equipment and Method: Stylet Placement Confirmation: ETT inserted through vocal cords under direct vision,  positive ETCO2 and breath sounds checked- equal and bilateral Secured at: 20 cm Tube secured with: Tape Dental Injury: Teeth and Oropharynx as per pre-operative assessment

## 2016-08-17 NOTE — Anesthesia Postprocedure Evaluation (Signed)
Anesthesia Post Note  Patient: Sandra McalpineShirley J Phillips  Procedure(s) Performed: Procedure(s) (LRB): LAPAROSCOPIC ASSISTED VAGINAL HYSTERECTOMY WITH SALPINGO OOPHORECTOMY (Bilateral) ANTERIOR AND POSTERIOR REPAIR WITH SACROSPINOUS FIXATION (N/A)  Patient location during evaluation: PACU Anesthesia Type: General Level of consciousness: awake and alert and patient cooperative Pain management: pain level controlled Vital Signs Assessment: post-procedure vital signs reviewed and stable Respiratory status: spontaneous breathing and respiratory function stable Cardiovascular status: stable Anesthetic complications: no        Last Vitals:  Vitals:   08/17/16 1130 08/17/16 1157  BP: (!) 125/47 (!) 124/59  Pulse: (!) 46 (!) 46  Resp: 16   Temp: 36.4 C 36.6 C    Last Pain:  Vitals:   08/17/16 1157  TempSrc:   PainSc: 2    Pain Goal: Patients Stated Pain Goal: 3 (08/17/16 1130)               Coron Rossano S

## 2016-08-17 NOTE — Brief Op Note (Signed)
08/17/2016  9:28 AM  PATIENT:  [name]  76 y.o. female  PRE-OPERATIVE DIAGNOSIS:  pelvic prolapse   POST-OPERATIVE DIAGNOSIS:  pelvic prolapse   PROCEDURE:  Laparoscopic assisted vaginal hysterectomy with bilateral salpingo oophorectomy, anterior / posterior vaginal repair,cystoscopy  SURGEON:  Surgeon(s) and Role:    Sandra Hedge* Angeleen Horney, [name] - Primary    * [name], Sandra EtienneElise Jennifer, [name] - Assisting  PHYSICIAN ASSISTANT:   ASSISTANTS: [name]   ANESTHESIA:   general  EBL:  Total I/O In: 1000 [I.V.:1000] Out: 350 [Urine:200; Blood:150]  BLOOD ADMINISTERED:none  DRAINS: Urinary Catheter (Foley)   LOCAL MEDICATIONS USED:  MARCAINE    and LIDOCAINE   SPECIMEN:  Source of Specimen:  uterus, bilateral tubes/ovaries  DISPOSITION OF SPECIMEN:  PATHOLOGY  COUNTS:  YES  TOURNIQUET:  * No tourniquets in log *  DICTATION: .Other Dictation: Dictation Number [id number]  PLAN OF CARE: Admit for overnight observation  PATIENT DISPOSITION:  PACU - hemodynamically stable.   Delay start of Pharmacological VTE agent (>24hrs) due to surgical blood loss or risk of bleeding: not applicable

## 2016-08-18 ENCOUNTER — Encounter (HOSPITAL_COMMUNITY): Payer: Self-pay | Admitting: Obstetrics and Gynecology

## 2016-08-18 DIAGNOSIS — N8189 Other female genital prolapse: Secondary | ICD-10-CM | POA: Diagnosis not present

## 2016-08-18 LAB — CBC
HEMATOCRIT: 35 % — AB (ref 36.0–46.0)
Hemoglobin: 12 g/dL (ref 12.0–15.0)
MCH: 29.5 pg (ref 26.0–34.0)
MCHC: 34.3 g/dL (ref 30.0–36.0)
MCV: 86 fL (ref 78.0–100.0)
Platelets: 173 10*3/uL (ref 150–400)
RBC: 4.07 MIL/uL (ref 3.87–5.11)
RDW: 13.6 % (ref 11.5–15.5)
WBC: 13.4 10*3/uL — AB (ref 4.0–10.5)

## 2016-08-18 MED ORDER — TRAMADOL HCL 50 MG PO TABS: 50.0000 mg | ORAL_TABLET | Freq: Four times a day (QID) | ORAL | 0 refills | Status: DC | PRN

## 2016-08-18 NOTE — Progress Notes (Signed)
Vaginal packing and foley removed . Pt tolerated that well. Had small to moderate amount of blood on vaginal packing (gauze). No clots noted. Pt denies pain.

## 2016-08-18 NOTE — Discharge Summary (Signed)
Physician Discharge Summary  Patient ID: [name] MRN: [id number] DOB/AGE: 1940/11/10 76 y.o.  Admit date: 08/17/2016 Discharge date: 08/18/2016  Admission Diagnoses:pelvic prolapse  Discharge Diagnoses:  Active Problems:   Pelvic prolapse   Discharged Condition: good  Hospital Course: good resumption of bowel/bladder function, voiding well, + flatus, tolerating regular diet, good pain relief  Consults: None  Significant Diagnostic Studies: labs:  Results for orders placed or performed during the hospital encounter of 08/17/16 (from the past 24 hour(s))  CBC     Status: Abnormal   Collection Time: 08/18/16  6:09 AM  Result Value Ref Range   WBC 13.4 (H) 4.0 - 10.5 K/uL   RBC 4.07 3.87 - 5.11 MIL/uL   Hemoglobin 12.0 12.0 - 15.0 g/dL   HCT 40.935.0 (L) 81.136.0 - 91.446.0 %   MCV 86.0 78.0 - 100.0 fL   MCH 29.5 26.0 - 34.0 pg   MCHC 34.3 30.0 - 36.0 g/dL   RDW 78.213.6 95.611.5 - 21.315.5 %   Platelets 173 150 - 400 K/uL    Treatments: IV hydration and surgery: LAVH/BSO, A&P repair, cystoscopy  Discharge Exam: Blood pressure (!) 142/55, pulse 70, temperature 98.7 F (37.1 C), temperature source Oral, resp. rate 18, height 5\' 2"  (1.575 m), weight 147 lb (66.7 kg), SpO2 99 %. General appearance: alert, cooperative and no distress GI: soft, non-tender; bowel sounds normal; no masses,  no organomegaly  Disposition: 01-Home or Self Care  Discharge Instructions    Discontinue IV    Complete by:  As directed      Allergies as of 08/18/2016      Reactions   Penicillins Swelling   UNSPECIFIED SWELLING. Has patient had reaction causing immediate rash, facial/tongue/throat swelling, SOB or lightheadedness, hypotension: No Reaction causing severe rash involving mucus membranes or skin necrosis: No Has patient had a PCN reaction that required hospitalization No Has patient had a PCN reaction occurring within the last 10 years: No If all of the above answers are "NO", then may proceed with  Cephalosporin use.   Oxycodone Other (See Comments)   Patient Preference  Makes her feel "weird" and she prefers not to take it      Medication List    TAKE these medications   furosemide 20 MG tablet Commonly known as:  LASIX Take 20 mg by mouth daily as needed for fluid.   levothyroxine 100 MCG tablet Commonly known as:  SYNTHROID, LEVOTHROID Take 100 mcg by mouth daily before breakfast.   lisinopril 10 MG tablet Commonly known as:  PRINIVIL,ZESTRIL Take 10 mg by mouth daily.   traMADol 50 MG tablet Commonly known as:  ULTRAM Take 1 tablet (50 mg total) by mouth every 6 (six) hours as needed for moderate pain.        Signed: Retta MacMBLIN II,Aldwin Micalizzi E 08/18/2016, 12:57 PM

## 2016-08-18 NOTE — Progress Notes (Signed)
Tolerating regular diet, +flatus, voiding well, ambulating well, excellent pain relief  VSS Afeb  Abdomen soft, incisions OK  Results for orders placed or performed during the hospital encounter of 08/17/16 (from the past 24 hour(s))  CBC     Status: Abnormal   Collection Time: 08/18/16  6:09 AM  Result Value Ref Range   WBC 13.4 (H) 4.0 - 10.5 K/uL   RBC 4.07 3.87 - 5.11 MIL/uL   Hemoglobin 12.0 12.0 - 15.0 g/dL   HCT 78.235.0 (L) 95.636.0 - 21.346.0 %   MCV 86.0 78.0 - 100.0 fL   MCH 29.5 26.0 - 34.0 pg   MCHC 34.3 30.0 - 36.0 g/dL   RDW 08.613.6 57.811.5 - 46.915.5 %   Platelets 173 150 - 400 K/uL   A/P: Satisfactory recovery         D/C home         Instructions given

## 2016-08-18 NOTE — Progress Notes (Signed)
Discharge teaching complete. Pt understood all information and did not have any questions. Pt discharged home to family. 

## 2016-12-07 DIAGNOSIS — Z961 Presence of intraocular lens: Secondary | ICD-10-CM | POA: Diagnosis not present

## 2016-12-07 DIAGNOSIS — H5203 Hypermetropia, bilateral: Secondary | ICD-10-CM | POA: Diagnosis not present

## 2016-12-07 DIAGNOSIS — E119 Type 2 diabetes mellitus without complications: Secondary | ICD-10-CM | POA: Diagnosis not present

## 2016-12-27 DIAGNOSIS — M25531 Pain in right wrist: Secondary | ICD-10-CM | POA: Diagnosis not present

## 2016-12-27 DIAGNOSIS — M19031 Primary osteoarthritis, right wrist: Secondary | ICD-10-CM | POA: Diagnosis not present

## 2016-12-27 DIAGNOSIS — M19032 Primary osteoarthritis, left wrist: Secondary | ICD-10-CM | POA: Diagnosis not present

## 2016-12-27 DIAGNOSIS — M25431 Effusion, right wrist: Secondary | ICD-10-CM | POA: Diagnosis not present

## 2017-01-05 DIAGNOSIS — I10 Essential (primary) hypertension: Secondary | ICD-10-CM | POA: Diagnosis not present

## 2017-01-05 DIAGNOSIS — E119 Type 2 diabetes mellitus without complications: Secondary | ICD-10-CM | POA: Diagnosis not present

## 2017-01-05 DIAGNOSIS — M19032 Primary osteoarthritis, left wrist: Secondary | ICD-10-CM | POA: Diagnosis not present

## 2017-01-05 DIAGNOSIS — E039 Hypothyroidism, unspecified: Secondary | ICD-10-CM | POA: Diagnosis not present

## 2017-01-05 DIAGNOSIS — R609 Edema, unspecified: Secondary | ICD-10-CM | POA: Diagnosis not present

## 2017-01-05 DIAGNOSIS — Z01818 Encounter for other preprocedural examination: Secondary | ICD-10-CM | POA: Diagnosis not present

## 2017-01-18 DIAGNOSIS — G8918 Other acute postprocedural pain: Secondary | ICD-10-CM | POA: Diagnosis not present

## 2017-01-18 DIAGNOSIS — M19032 Primary osteoarthritis, left wrist: Secondary | ICD-10-CM | POA: Diagnosis not present

## 2017-01-18 DIAGNOSIS — M19031 Primary osteoarthritis, right wrist: Secondary | ICD-10-CM | POA: Diagnosis not present

## 2017-01-18 DIAGNOSIS — G5642 Causalgia of left upper limb: Secondary | ICD-10-CM | POA: Diagnosis not present

## 2017-01-18 DIAGNOSIS — M13132 Monoarthritis, not elsewhere classified, left wrist: Secondary | ICD-10-CM | POA: Diagnosis not present

## 2017-01-18 DIAGNOSIS — M13131 Monoarthritis, not elsewhere classified, right wrist: Secondary | ICD-10-CM | POA: Diagnosis not present

## 2017-01-25 DIAGNOSIS — M19032 Primary osteoarthritis, left wrist: Secondary | ICD-10-CM | POA: Diagnosis not present

## 2017-02-02 DIAGNOSIS — M19032 Primary osteoarthritis, left wrist: Secondary | ICD-10-CM | POA: Diagnosis not present

## 2017-02-16 DIAGNOSIS — M25431 Effusion, right wrist: Secondary | ICD-10-CM | POA: Diagnosis not present

## 2017-02-16 DIAGNOSIS — M25531 Pain in right wrist: Secondary | ICD-10-CM | POA: Diagnosis not present

## 2017-03-06 IMAGING — CR DG SHOULDER 2+V PORT*R*
2 series · 2 of 2 positions shown · non-contrast
Comparison: None.

CLINICAL DATA: Status post right total shoulder arthroplasty.
Initial encounter.

EXAM:
PORTABLE RIGHT SHOULDER - 2+ VIEW

[AP (1 of 2)]
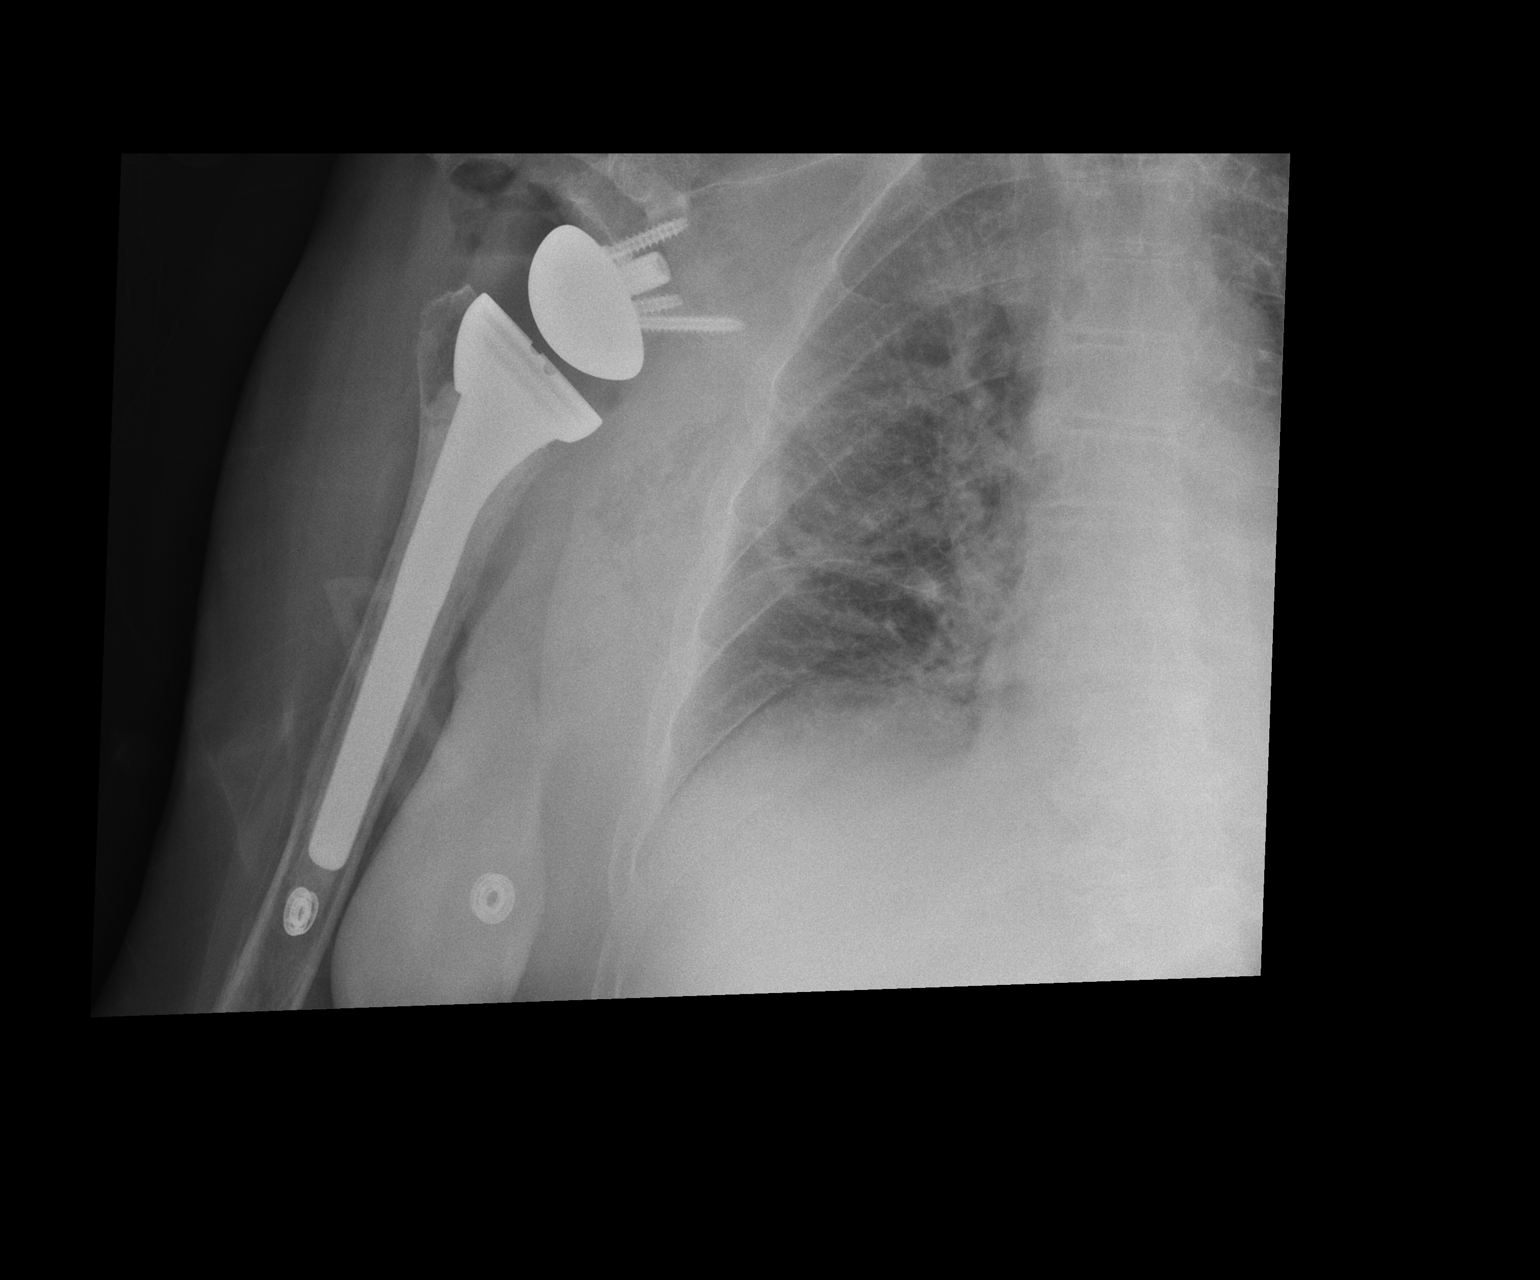

[AP (2 of 2)]
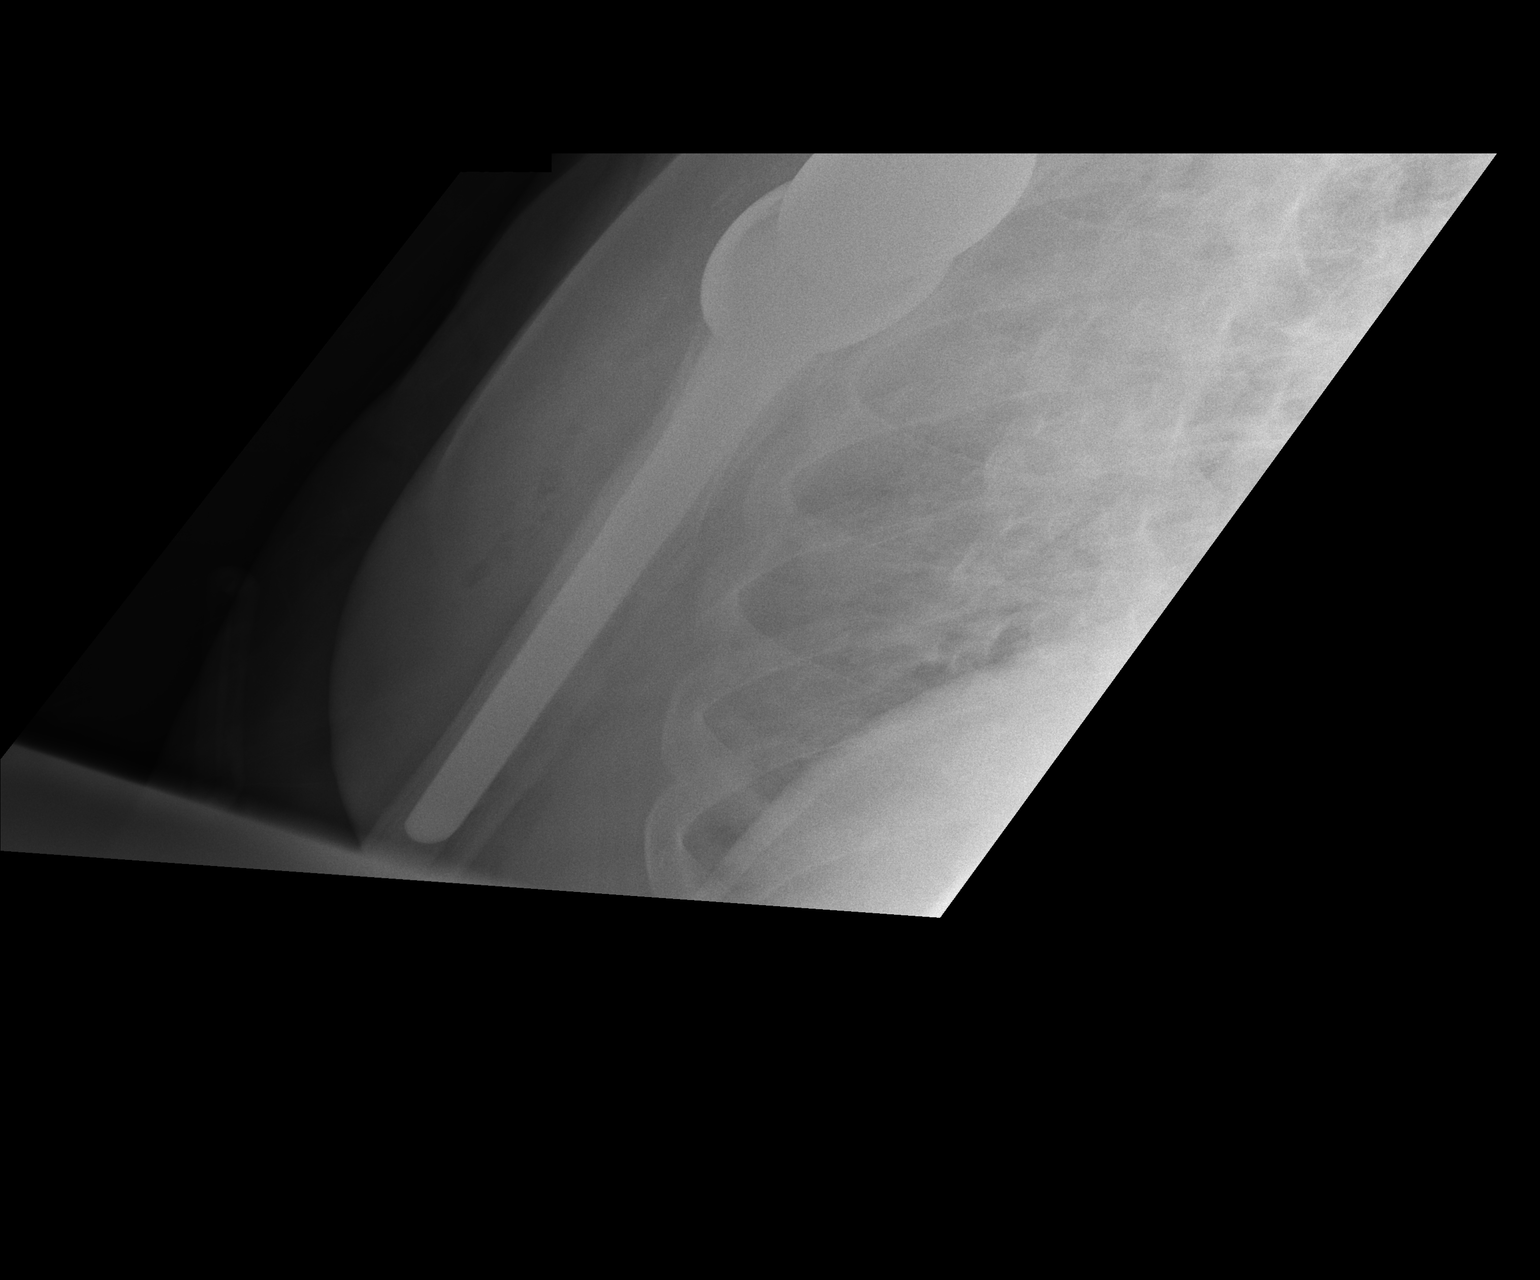

[2 of 2 positions shown; findings below may reference images not displayed]

FINDINGS: The patient's right shoulder arthroplasty is grossly unremarkable in
appearance, though the right scapula is not well assessed on
provided images. The underlying glenoid is difficult to fully
assess. The right acromioclavicular joint is unremarkable. Scattered
surrounding postoperative air is noted.

Vascular congestion is noted, with underlying atelectasis.
IMPRESSION: Right shoulder arthroplasty is grossly unremarkable in appearance,
though the scapula and glenoid are not well assessed.

## 2017-03-16 DIAGNOSIS — M25532 Pain in left wrist: Secondary | ICD-10-CM | POA: Diagnosis not present

## 2017-03-16 DIAGNOSIS — M19032 Primary osteoarthritis, left wrist: Secondary | ICD-10-CM | POA: Diagnosis not present

## 2017-03-16 DIAGNOSIS — M25432 Effusion, left wrist: Secondary | ICD-10-CM | POA: Diagnosis not present

## 2017-07-05 DIAGNOSIS — I10 Essential (primary) hypertension: Secondary | ICD-10-CM | POA: Diagnosis not present

## 2017-07-05 DIAGNOSIS — E039 Hypothyroidism, unspecified: Secondary | ICD-10-CM | POA: Diagnosis not present

## 2017-07-05 DIAGNOSIS — Z6826 Body mass index (BMI) 26.0-26.9, adult: Secondary | ICD-10-CM | POA: Diagnosis not present

## 2017-07-05 DIAGNOSIS — E119 Type 2 diabetes mellitus without complications: Secondary | ICD-10-CM | POA: Diagnosis not present

## 2017-07-05 DIAGNOSIS — R609 Edema, unspecified: Secondary | ICD-10-CM | POA: Diagnosis not present

## 2018-01-09 DIAGNOSIS — R609 Edema, unspecified: Secondary | ICD-10-CM | POA: Diagnosis not present

## 2018-01-09 DIAGNOSIS — I10 Essential (primary) hypertension: Secondary | ICD-10-CM | POA: Diagnosis not present

## 2018-01-09 DIAGNOSIS — Z9181 History of falling: Secondary | ICD-10-CM | POA: Diagnosis not present

## 2018-01-09 DIAGNOSIS — Z1331 Encounter for screening for depression: Secondary | ICD-10-CM | POA: Diagnosis not present

## 2018-01-09 DIAGNOSIS — R7303 Prediabetes: Secondary | ICD-10-CM | POA: Diagnosis not present

## 2018-01-09 DIAGNOSIS — E039 Hypothyroidism, unspecified: Secondary | ICD-10-CM | POA: Diagnosis not present

## 2018-01-09 DIAGNOSIS — Z1339 Encounter for screening examination for other mental health and behavioral disorders: Secondary | ICD-10-CM | POA: Diagnosis not present

## 2018-03-16 DIAGNOSIS — M19039 Primary osteoarthritis, unspecified wrist: Secondary | ICD-10-CM | POA: Diagnosis not present

## 2018-03-16 DIAGNOSIS — M25531 Pain in right wrist: Secondary | ICD-10-CM | POA: Diagnosis not present

## 2018-04-25 DIAGNOSIS — M19031 Primary osteoarthritis, right wrist: Secondary | ICD-10-CM | POA: Diagnosis not present

## 2018-04-25 DIAGNOSIS — G8918 Other acute postprocedural pain: Secondary | ICD-10-CM | POA: Diagnosis not present

## 2018-05-10 DIAGNOSIS — M25531 Pain in right wrist: Secondary | ICD-10-CM | POA: Diagnosis not present

## 2018-05-10 DIAGNOSIS — M19031 Primary osteoarthritis, right wrist: Secondary | ICD-10-CM | POA: Diagnosis not present

## 2018-05-10 DIAGNOSIS — Z5189 Encounter for other specified aftercare: Secondary | ICD-10-CM | POA: Diagnosis not present

## 2018-05-25 DIAGNOSIS — M19031 Primary osteoarthritis, right wrist: Secondary | ICD-10-CM | POA: Diagnosis not present

## 2018-05-25 DIAGNOSIS — M25531 Pain in right wrist: Secondary | ICD-10-CM | POA: Diagnosis not present

## 2018-05-25 DIAGNOSIS — Z5189 Encounter for other specified aftercare: Secondary | ICD-10-CM | POA: Diagnosis not present

## 2018-06-22 DIAGNOSIS — M25531 Pain in right wrist: Secondary | ICD-10-CM | POA: Diagnosis not present

## 2018-06-22 DIAGNOSIS — M19031 Primary osteoarthritis, right wrist: Secondary | ICD-10-CM | POA: Diagnosis not present

## 2018-06-22 DIAGNOSIS — Z5189 Encounter for other specified aftercare: Secondary | ICD-10-CM | POA: Diagnosis not present

## 2018-07-25 DIAGNOSIS — E039 Hypothyroidism, unspecified: Secondary | ICD-10-CM | POA: Diagnosis not present

## 2018-07-25 DIAGNOSIS — R0789 Other chest pain: Secondary | ICD-10-CM | POA: Diagnosis not present

## 2018-07-25 DIAGNOSIS — R609 Edema, unspecified: Secondary | ICD-10-CM | POA: Diagnosis not present

## 2018-07-25 DIAGNOSIS — I10 Essential (primary) hypertension: Secondary | ICD-10-CM | POA: Diagnosis not present

## 2018-07-25 DIAGNOSIS — Z139 Encounter for screening, unspecified: Secondary | ICD-10-CM | POA: Diagnosis not present

## 2018-08-24 DIAGNOSIS — J309 Allergic rhinitis, unspecified: Secondary | ICD-10-CM | POA: Diagnosis not present

## 2018-08-24 DIAGNOSIS — H6980 Other specified disorders of Eustachian tube, unspecified ear: Secondary | ICD-10-CM | POA: Diagnosis not present

## 2018-08-28 DIAGNOSIS — H9120 Sudden idiopathic hearing loss, unspecified ear: Secondary | ICD-10-CM | POA: Diagnosis not present

## 2018-08-28 DIAGNOSIS — Z6825 Body mass index (BMI) 25.0-25.9, adult: Secondary | ICD-10-CM | POA: Diagnosis not present

## 2018-10-26 DIAGNOSIS — R7303 Prediabetes: Secondary | ICD-10-CM | POA: Diagnosis not present

## 2018-10-26 DIAGNOSIS — Z6826 Body mass index (BMI) 26.0-26.9, adult: Secondary | ICD-10-CM | POA: Diagnosis not present

## 2018-10-26 DIAGNOSIS — E039 Hypothyroidism, unspecified: Secondary | ICD-10-CM | POA: Diagnosis not present

## 2018-10-26 DIAGNOSIS — R609 Edema, unspecified: Secondary | ICD-10-CM | POA: Diagnosis not present

## 2018-10-26 DIAGNOSIS — I10 Essential (primary) hypertension: Secondary | ICD-10-CM | POA: Diagnosis not present

## 2018-11-16 DIAGNOSIS — M25552 Pain in left hip: Secondary | ICD-10-CM | POA: Diagnosis not present

## 2018-11-16 DIAGNOSIS — Z96643 Presence of artificial hip joint, bilateral: Secondary | ICD-10-CM | POA: Diagnosis not present

## 2018-11-16 DIAGNOSIS — M7062 Trochanteric bursitis, left hip: Secondary | ICD-10-CM | POA: Diagnosis not present

## 2018-11-16 DIAGNOSIS — Z471 Aftercare following joint replacement surgery: Secondary | ICD-10-CM | POA: Diagnosis not present

## 2019-06-15 DIAGNOSIS — M25551 Pain in right hip: Secondary | ICD-10-CM | POA: Diagnosis not present

## 2019-06-15 DIAGNOSIS — Z96643 Presence of artificial hip joint, bilateral: Secondary | ICD-10-CM | POA: Diagnosis not present

## 2019-06-15 DIAGNOSIS — M25552 Pain in left hip: Secondary | ICD-10-CM | POA: Diagnosis not present

## 2019-07-04 DIAGNOSIS — H9201 Otalgia, right ear: Secondary | ICD-10-CM | POA: Diagnosis not present

## 2019-07-04 DIAGNOSIS — R7303 Prediabetes: Secondary | ICD-10-CM | POA: Diagnosis not present

## 2019-07-04 DIAGNOSIS — I10 Essential (primary) hypertension: Secondary | ICD-10-CM | POA: Diagnosis not present

## 2019-07-04 DIAGNOSIS — Z6826 Body mass index (BMI) 26.0-26.9, adult: Secondary | ICD-10-CM | POA: Diagnosis not present

## 2019-07-04 DIAGNOSIS — R609 Edema, unspecified: Secondary | ICD-10-CM | POA: Diagnosis not present

## 2019-07-04 DIAGNOSIS — E039 Hypothyroidism, unspecified: Secondary | ICD-10-CM | POA: Diagnosis not present

## 2019-07-25 DIAGNOSIS — H90A31 Mixed conductive and sensorineural hearing loss, unilateral, right ear with restricted hearing on the contralateral side: Secondary | ICD-10-CM | POA: Diagnosis not present

## 2019-07-25 DIAGNOSIS — H6983 Other specified disorders of Eustachian tube, bilateral: Secondary | ICD-10-CM | POA: Diagnosis not present

## 2019-07-25 DIAGNOSIS — H90A22 Sensorineural hearing loss, unilateral, left ear, with restricted hearing on the contralateral side: Secondary | ICD-10-CM | POA: Diagnosis not present

## 2019-07-25 DIAGNOSIS — H906 Mixed conductive and sensorineural hearing loss, bilateral: Secondary | ICD-10-CM | POA: Diagnosis not present

## 2019-08-06 DIAGNOSIS — J302 Other seasonal allergic rhinitis: Secondary | ICD-10-CM | POA: Diagnosis not present

## 2019-10-12 DIAGNOSIS — M5432 Sciatica, left side: Secondary | ICD-10-CM | POA: Diagnosis not present

## 2019-10-22 DIAGNOSIS — M47896 Other spondylosis, lumbar region: Secondary | ICD-10-CM | POA: Diagnosis not present

## 2019-10-22 DIAGNOSIS — M5432 Sciatica, left side: Secondary | ICD-10-CM | POA: Diagnosis not present

## 2019-10-22 DIAGNOSIS — M25552 Pain in left hip: Secondary | ICD-10-CM | POA: Diagnosis not present

## 2019-10-22 DIAGNOSIS — M25551 Pain in right hip: Secondary | ICD-10-CM | POA: Diagnosis not present

## 2019-12-21 DIAGNOSIS — M48062 Spinal stenosis, lumbar region with neurogenic claudication: Secondary | ICD-10-CM | POA: Diagnosis not present

## 2019-12-21 DIAGNOSIS — M545 Low back pain: Secondary | ICD-10-CM | POA: Diagnosis not present

## 2019-12-21 DIAGNOSIS — M5136 Other intervertebral disc degeneration, lumbar region: Secondary | ICD-10-CM | POA: Diagnosis not present

## 2020-01-07 DIAGNOSIS — M545 Low back pain: Secondary | ICD-10-CM | POA: Diagnosis not present

## 2020-01-11 DIAGNOSIS — M48062 Spinal stenosis, lumbar region with neurogenic claudication: Secondary | ICD-10-CM | POA: Diagnosis not present

## 2020-01-11 DIAGNOSIS — M545 Low back pain, unspecified: Secondary | ICD-10-CM | POA: Diagnosis not present

## 2020-02-12 DIAGNOSIS — M5136 Other intervertebral disc degeneration, lumbar region: Secondary | ICD-10-CM | POA: Diagnosis not present

## 2020-02-22 DIAGNOSIS — Z20822 Contact with and (suspected) exposure to covid-19: Secondary | ICD-10-CM | POA: Diagnosis not present

## 2020-02-24 ENCOUNTER — Other Ambulatory Visit: Payer: Self-pay

## 2020-02-24 ENCOUNTER — Emergency Department (HOSPITAL_COMMUNITY)
Admission: EM | Admit: 2020-02-24 | Discharge: 2020-02-24 | Disposition: A | Discharge status: Home / Self Care | Payer: PPO | Attending: Emergency Medicine | Admitting: Emergency Medicine

## 2020-02-24 ENCOUNTER — Encounter (HOSPITAL_COMMUNITY): Payer: Self-pay

## 2020-02-24 DIAGNOSIS — Z96611 Presence of right artificial shoulder joint: Secondary | ICD-10-CM | POA: Insufficient documentation

## 2020-02-24 DIAGNOSIS — Z23 Encounter for immunization: Secondary | ICD-10-CM | POA: Diagnosis not present

## 2020-02-24 DIAGNOSIS — E119 Type 2 diabetes mellitus without complications: Secondary | ICD-10-CM | POA: Diagnosis not present

## 2020-02-24 DIAGNOSIS — I10 Essential (primary) hypertension: Secondary | ICD-10-CM | POA: Insufficient documentation

## 2020-02-24 DIAGNOSIS — Z79899 Other long term (current) drug therapy: Secondary | ICD-10-CM | POA: Diagnosis not present

## 2020-02-24 DIAGNOSIS — Z96652 Presence of left artificial knee joint: Secondary | ICD-10-CM | POA: Diagnosis not present

## 2020-02-24 DIAGNOSIS — E039 Hypothyroidism, unspecified: Secondary | ICD-10-CM | POA: Insufficient documentation

## 2020-02-24 DIAGNOSIS — U071 COVID-19: Secondary | ICD-10-CM | POA: Diagnosis not present

## 2020-02-24 DIAGNOSIS — Z96643 Presence of artificial hip joint, bilateral: Secondary | ICD-10-CM | POA: Insufficient documentation

## 2020-02-24 DIAGNOSIS — R430 Anosmia: Secondary | ICD-10-CM | POA: Diagnosis present

## 2020-02-24 LAB — RESPIRATORY PANEL BY RT PCR (FLU A&B, COVID)
Influenza A by PCR: NEGATIVE
Influenza B by PCR: NEGATIVE
SARS Coronavirus 2 by RT PCR: POSITIVE — AB

## 2020-02-24 MED ORDER — SODIUM CHLORIDE 0.9 % IV SOLN: INTRAVENOUS | Status: DC | PRN

## 2020-02-24 MED ORDER — METHYLPREDNISOLONE SODIUM SUCC 125 MG IJ SOLR: 125.0000 mg | Freq: Once | INTRAMUSCULAR | Status: DC | PRN

## 2020-02-24 MED ORDER — ALBUTEROL SULFATE HFA 108 (90 BASE) MCG/ACT IN AERS: 2.0000 | INHALATION_SPRAY | Freq: Once | RESPIRATORY_TRACT | Status: DC | PRN

## 2020-02-24 MED ORDER — FAMOTIDINE IN NACL 20-0.9 MG/50ML-% IV SOLN: 20.0000 mg | Freq: Once | INTRAVENOUS | Status: DC | PRN

## 2020-02-24 MED ORDER — ETESEVIMAB 700MG/ 20ML INJECTION
Freq: Once | INTRAVENOUS | Status: AC
  Filled 2020-02-24: qty 20

## 2020-02-24 MED ORDER — EPINEPHRINE 0.3 MG/0.3ML IJ SOAJ: 0.3000 mg | Freq: Once | INTRAMUSCULAR | Status: DC | PRN

## 2020-02-24 MED ORDER — DIPHENHYDRAMINE HCL 50 MG/ML IJ SOLN: 50.0000 mg | Freq: Once | INTRAMUSCULAR | Status: DC | PRN

## 2020-02-24 MED ORDER — SODIUM CHLORIDE 0.9 % IV SOLN: 1200.0000 mg | Freq: Once | INTRAVENOUS | Status: DC

## 2020-02-24 NOTE — ED Provider Notes (Signed)
[facility]-EMERGENCY DEPT Provider Note   CSN: [id number] Arrival date & time: 02/24/20  7106     History Chief Complaint  Patient presents with  . Shortness of Breath    [name] is a 79 y.o. female.  79 year old female presents with concern for possible Covid exposure.  Patient states that her symptoms started about 4 days ago and that she is at loss of taste and smell.  Did have one episode of emesis with some diarrhea which is since resolved.  Denies being severely short of breath.  Has had positive Covid exposures.  States she feels slightly weak but otherwise feels okay.  Denies any chest pain or chest pressure.        Past Medical History:  Diagnosis Date  . Arthritis   . Diabetes mellitus without complication (HCC)    hx of 2006 no longer on meds   . Hypertension   . Hypothyroidism   . Thyroid disease     Patient Active Problem List   Diagnosis Date Noted  . Pelvic prolapse 08/17/2016  . S/P shoulder replacement 09/03/2015  . S/P total knee replacement 03/28/2015  . Hypokalemia 10/02/2013  . Diarrhea 10/02/2013  . Diabetes mellitus (HCC) 10/02/2013  . HTN (hypertension) 10/02/2013  . Hypothyroidism 10/02/2013    Past Surgical History:  Procedure Laterality Date  . ANTERIOR AND POSTERIOR REPAIR WITH SACROSPINOUS FIXATION N/A 08/17/2016   Procedure: ANTERIOR AND POSTERIOR REPAIR;  Surgeon: Harold Hedge, MD;  Location: [facility];  Service: Gynecology;  Laterality: N/A;  . APPENDECTOMY    . bilateral cataract surgery     . CHOLECYSTECTOMY    . LAPAROSCOPIC VAGINAL HYSTERECTOMY WITH SALPINGO OOPHORECTOMY Bilateral 08/17/2016   Procedure: LAPAROSCOPIC ASSISTED VAGINAL HYSTERECTOMY WITH SALPINGO OOPHORECTOMY;  Surgeon: Harold Hedge, MD;  Location: [facility];  Service: Gynecology;  Laterality: Bilateral;  . REVERSE SHOULDER ARTHROPLASTY Right 09/03/2015   Procedure: RIGHT REVERSE SHOULDER ARTHROPLASTY;  Surgeon: Beverely Low, MD;   Location: [facility] OR;  Service: Orthopedics;  Laterality: Right;  . THYROIDECTOMY    . TONSILLECTOMY    . TOTAL HIP ARTHROPLASTY     bilateral  . TOTAL KNEE ARTHROPLASTY Left 03/28/2015   Procedure: LEFT TOTAL KNEE ARTHROPLASTY;  Surgeon: Eugenia Mcalpine, MD;  Location: [facility];  Service: Orthopedics;  Laterality: Left;  . TUBAL LIGATION    . URETHRAL DILATION       OB History   No obstetric history on file.     Family History  Problem Relation Age of Onset  . CAD Neg Hx   . Diabetes Mellitus II Neg Hx     Social History   Tobacco Use  . Smoking status: Never Smoker  . Smokeless tobacco: Never Used  Substance Use Topics  . Alcohol use: No  . Drug use: No    Home Medications Prior to Admission medications   Medication Sig Start Date End Date Taking? Authorizing Provider  furosemide (LASIX) 20 MG tablet Take 20 mg by mouth daily as needed for fluid.     [provider]  levothyroxine (SYNTHROID, LEVOTHROID) 100 MCG tablet Take 100 mcg by mouth daily before breakfast.  09/04/13   [provider]  lisinopril (PRINIVIL,ZESTRIL) 10 MG tablet Take 10 mg by mouth daily.    [provider]  traMADol (ULTRAM) 50 MG tablet Take 1 tablet (50 mg total) by mouth every 6 (six) hours as needed for moderate pain. 08/18/16   Harold Hedge, MD    Allergies  Penicillins and Oxycodone  Review of Systems   Review of Systems  All other systems reviewed and are negative.   Physical Exam Updated Vital Signs BP 140/83 (BP Location: Right Arm)   Pulse 72   Temp 98.2 F (36.8 C) (Oral)   Resp 20   SpO2 98%   Physical Exam Vitals and nursing note reviewed.  Constitutional:      General: She is not in acute distress.    Appearance: Normal appearance. She is well-developed. She is not toxic-appearing.  HENT:     Head: Normocephalic and atraumatic.  Eyes:     General: Lids are normal.     Conjunctiva/sclera: Conjunctivae normal.     Pupils: Pupils are equal,  round, and reactive to light.  Neck:     Thyroid: No thyroid mass.     Trachea: No tracheal deviation.  Cardiovascular:     Rate and Rhythm: Normal rate and regular rhythm.     Heart sounds: Normal heart sounds. No murmur heard.  No gallop.   Pulmonary:     Effort: Pulmonary effort is normal. No respiratory distress.     Breath sounds: Normal breath sounds. No stridor. No decreased breath sounds, wheezing, rhonchi or rales.  Abdominal:     General: Bowel sounds are normal. There is no distension.     Palpations: Abdomen is soft.     Tenderness: There is no abdominal tenderness. There is no rebound.  Musculoskeletal:        General: No tenderness. Normal range of motion.     Cervical back: Normal range of motion and neck supple.  Skin:    General: Skin is warm and dry.     Findings: No abrasion or rash.  Neurological:     Mental Status: She is alert and oriented to person, place, and time.     GCS: GCS eye subscore is 4. GCS verbal subscore is 5. GCS motor subscore is 6.     Cranial Nerves: No cranial nerve deficit.     Sensory: No sensory deficit.  Psychiatric:        Speech: Speech normal.        Behavior: Behavior normal.     ED Results / Procedures / Treatments   Labs (all labs ordered are listed, but only abnormal results are displayed) Labs Reviewed  RESPIRATORY PANEL BY RT PCR (FLU A&B, COVID)    EKG None  Radiology No results found.  Procedures Procedures (including critical care time)  Medications Ordered in ED Medications - No data to display  ED Course  I have reviewed the triage vital signs and the nursing notes.  Pertinent labs & imaging results that were available during my care of the patient were reviewed by me and considered in my medical decision making (see chart for details).    MDM Rules/Calculators/A&P                         Patient with positive Covid test here.  Given monoclonal antibody.  She feels fine.  Will discharge home Final  Clinical Impression(s) / ED Diagnoses Final diagnoses:  None    Rx / DC Orders ED Discharge Orders    None       Lorre Nick, MD 02/24/20 1330

## 2020-02-24 NOTE — Discharge Instructions (Addendum)
Quarantine as directed by the [employer].  Follow-up with your doctor as needed

## 2020-02-24 NOTE — ED Notes (Signed)
Date and time results received: 02/24/20 1041  (use smartphrase ".now" to insert current time)  Test: covid Critical Value: positive  Name of Provider Notified: Freida Busman  Orders Received? Or Actions Taken?:

## 2020-02-24 NOTE — ED Notes (Signed)
Pt arrived via walk in, states she believes she has COVID. States multiple members of church group tested COVID (+). States she started having sx, for about 3-4 days. No sense of taste or smell, minimal SOB, fevers, well controlled with tylenol.

## 2020-03-10 DIAGNOSIS — Z139 Encounter for screening, unspecified: Secondary | ICD-10-CM | POA: Diagnosis not present

## 2020-03-10 DIAGNOSIS — Z6825 Body mass index (BMI) 25.0-25.9, adult: Secondary | ICD-10-CM | POA: Diagnosis not present

## 2020-03-10 DIAGNOSIS — R609 Edema, unspecified: Secondary | ICD-10-CM | POA: Diagnosis not present

## 2020-03-10 DIAGNOSIS — Z1331 Encounter for screening for depression: Secondary | ICD-10-CM | POA: Diagnosis not present

## 2020-03-10 DIAGNOSIS — Z8616 Personal history of COVID-19: Secondary | ICD-10-CM | POA: Diagnosis not present

## 2020-03-10 DIAGNOSIS — Z9181 History of falling: Secondary | ICD-10-CM | POA: Diagnosis not present

## 2020-03-10 DIAGNOSIS — E039 Hypothyroidism, unspecified: Secondary | ICD-10-CM | POA: Diagnosis not present

## 2020-03-10 DIAGNOSIS — R7303 Prediabetes: Secondary | ICD-10-CM | POA: Diagnosis not present

## 2020-03-10 DIAGNOSIS — I10 Essential (primary) hypertension: Secondary | ICD-10-CM | POA: Diagnosis not present

## 2020-03-18 DIAGNOSIS — M545 Low back pain, unspecified: Secondary | ICD-10-CM | POA: Diagnosis not present

## 2020-03-24 DIAGNOSIS — R7303 Prediabetes: Secondary | ICD-10-CM | POA: Diagnosis not present

## 2020-03-24 DIAGNOSIS — Z01818 Encounter for other preprocedural examination: Secondary | ICD-10-CM | POA: Diagnosis not present

## 2020-03-24 DIAGNOSIS — Z6825 Body mass index (BMI) 25.0-25.9, adult: Secondary | ICD-10-CM | POA: Diagnosis not present

## 2020-03-24 DIAGNOSIS — M48061 Spinal stenosis, lumbar region without neurogenic claudication: Secondary | ICD-10-CM | POA: Diagnosis not present

## 2020-03-24 DIAGNOSIS — I10 Essential (primary) hypertension: Secondary | ICD-10-CM | POA: Diagnosis not present

## 2020-03-25 ENCOUNTER — Ambulatory Visit: Payer: Self-pay | Admitting: Orthopedic Surgery

## 2020-03-27 DIAGNOSIS — H903 Sensorineural hearing loss, bilateral: Secondary | ICD-10-CM | POA: Diagnosis not present

## 2020-04-14 ENCOUNTER — Ambulatory Visit: Payer: Self-pay | Admitting: Orthopedic Surgery

## 2020-04-14 NOTE — H&P (Signed)
[name] is an 80 y.o. female.   Chief Complaint: back and leg pain HPI: Reported by patient. Reason for Visit: low back Context: The patient is 5 months out from the onset of symptoms Location (Lower Extremity): lower back pain on the left Severity: pain level 6/10 Aggravating Factors: lying down Associated Symptoms: numbness/tingling (left lower leg) Medications: not helping at all; The patient is taking Tylenol prn Notes: The patient is 5 weeks out from L4-5 ESI.  Past Medical History:  Diagnosis Date  . Arthritis   . Diabetes mellitus without complication ([place])    hx of 2006 no longer on meds   . Hypertension   . Hypothyroidism   . Thyroid disease     Past Surgical History:  Procedure Laterality Date  . ANTERIOR AND POSTERIOR REPAIR WITH SACROSPINOUS FIXATION N/A 08/17/2016   Procedure: ANTERIOR AND POSTERIOR REPAIR;  Surgeon: Everlene Farrier, MD;  Location: [facility];  Service: Gynecology;  Laterality: N/A;  . APPENDECTOMY    . bilateral cataract surgery     . CHOLECYSTECTOMY    . LAPAROSCOPIC VAGINAL HYSTERECTOMY WITH SALPINGO OOPHORECTOMY Bilateral 08/17/2016   Procedure: LAPAROSCOPIC ASSISTED VAGINAL HYSTERECTOMY WITH SALPINGO OOPHORECTOMY;  Surgeon: Everlene Farrier, MD;  Location: [facility];  Service: Gynecology;  Laterality: Bilateral;  . REVERSE SHOULDER ARTHROPLASTY Right 09/03/2015   Procedure: RIGHT REVERSE SHOULDER ARTHROPLASTY;  Surgeon: Netta Cedars, MD;  Location: [facility];  Service: Orthopedics;  Laterality: Right;  . THYROIDECTOMY    . TONSILLECTOMY    . TOTAL HIP ARTHROPLASTY     bilateral  . TOTAL KNEE ARTHROPLASTY Left 03/28/2015   Procedure: LEFT TOTAL KNEE ARTHROPLASTY;  Surgeon: Sydnee Cabal, MD;  Location: [facility];  Service: Orthopedics;  Laterality: Left;  . TUBAL LIGATION    . URETHRAL DILATION      Family History  Problem Relation Age of Onset  . CAD Neg Hx   . Diabetes Mellitus II Neg Hx    Social History:  reports that she has never smoked.  She has never used smokeless tobacco. She reports that she does not drink alcohol and does not use drugs.  Allergies:  Allergies  Allergen Reactions  . Penicillins Swelling    UNSPECIFIED SWELLING. Has patient had reaction causing immediate rash, facial/tongue/throat swelling, SOB or lightheadedness, hypotension: No Reaction causing severe rash involving mucus membranes or skin necrosis: No Has patient had a PCN reaction that required hospitalization No Has patient had a PCN reaction occurring within the last 10 years: No If all of the above answers are "NO", then may proceed with Cephalosporin use.   Marland Kitchen Oxycodone Other (See Comments)    Patient Preference  Makes her feel "weird" and she prefers not to take it   Medications: fluticasone propionate 50 mcg/actuation nasal spray,suspension furosemide 20 mg tablet levothyroxine 100 mcg tablet lisinopriL 10 mg tablet traMADoL 50 mg tablet  Review of Systems  Constitutional: Negative.   HENT: Negative.   Eyes: Negative.   Respiratory: Negative.   Cardiovascular: Negative.   Gastrointestinal: Negative.   Endocrine: Negative.   Genitourinary: Negative.   Musculoskeletal: Positive for back pain.  Skin: Negative.   Neurological: Positive for weakness and numbness.  Hematological: Negative.   Psychiatric/Behavioral: Negative.     There were no vitals taken for this visit. Physical Exam HENT:     Head: Normocephalic.     Right Ear: External ear normal.     Left Ear: External ear normal.     Nose: Nose normal.  Mouth/Throat:     Pharynx: Oropharynx is clear.  Eyes:     Conjunctiva/sclera: Conjunctivae normal.  Cardiovascular:     Rate and Rhythm: Normal rate.     Pulses: Normal pulses.  Pulmonary:     Effort: Pulmonary effort is normal.  Abdominal:     General: Bowel sounds are normal.  Musculoskeletal:     Cervical back: Normal range of motion.     Comments: Patient is an 80 year old female.  Gait and Station:  Appearance: ambulating with no assistive devices and antalgic gait.  Constitutional: General Appearance: healthy-appearing and distress (mild).  Psychiatric: Mood and Affect: active and alert.  Cardiovascular System: Edema Right: none; Dorsalis and posterior tibial pulses 2+. Edema Left: none.  Abdomen: Inspection and Palpation: non-distended and no tenderness.  Skin: Inspection and palpation: no rash.  Lumbar Spine: Inspection: normal alignment. Bony Palpation of the Lumbar Spine: tender at lumbosacral junction.. Bony Palpation of the Right Hip: no tenderness of the greater trochanter and tenderness of the SI joint; Pelvis stable. Bony Palpation of the Left Hip: no tenderness of the greater trochanter and tenderness of the SI joint. Soft Tissue Palpation on the Right: No flank pain with percussion. Active Range of Motion: limited flexion and extention.  Motor Strength: L1 Motor Strength on the Right: hip flexion iliopsoas 5/5. L1 Motor Strength on the Left: hip flexion iliopsoas 5/5. L2-L4 Motor Strength on the Right: knee extension quadriceps 5/5. L2-L4 Motor Strength on the Left: knee extension quadriceps 5/5. L5 Motor Strength on the Right: ankle dorsiflexion tibialis anterior 5/5 and great toe extension extensor hallucis longus 5/5. L5 Motor Strength on the Left: ankle dorsiflexion tibialis anterior 5/5 and great toe extension extensor hallucis longus 5/5. S1 Motor Strength on the Right: plantar flexion gastrocnemius 5/5. S1 Motor Strength on the Left: plantar flexion gastrocnemius 5/5.  Neurological System: Knee Reflex Right: normal (2). Knee Reflex Left: normal (2). Ankle Reflex Right: normal (2). Ankle Reflex Left: normal (2). Babinski Reflex Right: plantar reflex absent. Babinski Reflex Left: plantar reflex absent. Sensation on the Right: normal distal extremities. Sensation on the Left: normal distal extremities. Special Tests on the Right: no clonus of the ankle/knee and seated straight  leg raising test positive. Special Tests on the Left: no clonus of the ankle/knee and seated straight leg raising test positive.  Skin:    General: Skin is warm and dry.  Neurological:     Mental Status: She is alert.   MRI indicates severe spinal stenosis at L4-5 with neuroforaminal narrowing. Moderately severe at L3-4. Moderate at L2-3. And minimal grade 1 spondylolisthesis of L4 and L5.  Osteoporosis   Assessment/Plan Impression:  Neurogenic claudication secondary to spinal stenosis predominately at L4-5 on the left. With a minimal spondylolisthesis at L4-5. Spinal stenosis at L3-4 as well  Plan:  We discussed options including living with her symptoms avoiding extension favor flexion. She indicates this is significant negative affect her activities of daily living in terms of pain  The other option would be a lumbar decompression I would recommend at L4-5 and L 3 4. It would be appropriate to utilize autogenous bone graft in the lateral recesses for lateral mass fusion.  I had an extensive discussion with the patient concerning the pathology relevant anatomy and treatment options. At this point exhausting conservative treatment and in the presence of a neurologic deficit we discussed microlumbar decompression. I discussed the risks and benefits including bleeding, infection, DVT, PE, anesthetic complications, worsening in their symptoms, improvement in  their symptoms, C SF leakage, epidural fibrosis, need for future surgeries such as revision discectomy and lumbar fusion. I also indicated that this is an operation to basically decompress the nerve root to allow recovery as opposed to fixing a herniated disc and that the incidence of recurrent chest disc herniation can approach 15%. Also that nerve root recovery is variable and may not recover completely.  I discussed the operative course including overnight in the hospital. Immediate ambulation. Follow-up in 2 weeks for suture removal. 6  weeks until healing of the herniation followed by 6 weeks of reconditioning and strengthening of the core musculature. Also discussed the need to employ the concepts of disc pressure management and core motion following the surgery to minimize the risk of recurrent disc herniation. We will obtain preoperative clearance i if necessary and proceed accordingly.  Preoperative clearance. Overnight in the hospital.  Plan microlumbar decompression L4-5, L3-4, lateral mass fusion with autograft and allograft bone graft  Dorothy Spark, PA-C for Dr. Shelle Iron 04/14/2020, 12:01 PM

## 2020-04-14 NOTE — H&P (View-Only) (Signed)
[name] is an 80 y.o. female.   Chief Complaint: back and leg pain HPI: Reported by patient. Reason for Visit: low back Context: The patient is 5 months out from the onset of symptoms Location (Lower Extremity): lower back pain on the left Severity: pain level 6/10 Aggravating Factors: lying down Associated Symptoms: numbness/tingling (left lower leg) Medications: not helping at all; The patient is taking Tylenol prn Notes: The patient is 5 weeks out from L4-5 ESI.  Past Medical History:  Diagnosis Date  . Arthritis   . Diabetes mellitus without complication (HCC)    hx of 2006 no longer on meds   . Hypertension   . Hypothyroidism   . Thyroid disease     Past Surgical History:  Procedure Laterality Date  . ANTERIOR AND POSTERIOR REPAIR WITH SACROSPINOUS FIXATION N/A 08/17/2016   Procedure: ANTERIOR AND POSTERIOR REPAIR;  Surgeon: Tomblin, James, MD;  Location: [facility];  Service: Gynecology;  Laterality: N/A;  . APPENDECTOMY    . bilateral cataract surgery     . CHOLECYSTECTOMY    . LAPAROSCOPIC VAGINAL HYSTERECTOMY WITH SALPINGO OOPHORECTOMY Bilateral 08/17/2016   Procedure: LAPAROSCOPIC ASSISTED VAGINAL HYSTERECTOMY WITH SALPINGO OOPHORECTOMY;  Surgeon: Tomblin, James, MD;  Location: [facility];  Service: Gynecology;  Laterality: Bilateral;  . REVERSE SHOULDER ARTHROPLASTY Right 09/03/2015   Procedure: RIGHT REVERSE SHOULDER ARTHROPLASTY;  Surgeon: Steve Norris, MD;  Location: [facility];  Service: Orthopedics;  Laterality: Right;  . THYROIDECTOMY    . TONSILLECTOMY    . TOTAL HIP ARTHROPLASTY     bilateral  . TOTAL KNEE ARTHROPLASTY Left 03/28/2015   Procedure: LEFT TOTAL KNEE ARTHROPLASTY;  Surgeon: Robert Collins, MD;  Location: [facility];  Service: Orthopedics;  Laterality: Left;  . TUBAL LIGATION    . URETHRAL DILATION      Family History  Problem Relation Age of Onset  . CAD Neg Hx   . Diabetes Mellitus II Neg Hx    Social History:  reports that she has never smoked.  She has never used smokeless tobacco. She reports that she does not drink alcohol and does not use drugs.  Allergies:  Allergies  Allergen Reactions  . Penicillins Swelling    UNSPECIFIED SWELLING. Has patient had reaction causing immediate rash, facial/tongue/throat swelling, SOB or lightheadedness, hypotension: No Reaction causing severe rash involving mucus membranes or skin necrosis: No Has patient had a PCN reaction that required hospitalization No Has patient had a PCN reaction occurring within the last 10 years: No If all of the above answers are "NO", then may proceed with Cephalosporin use.   . Oxycodone Other (See Comments)    Patient Preference  Makes her feel "weird" and she prefers not to take it   Medications: fluticasone propionate 50 mcg/actuation nasal spray,suspension furosemide 20 mg tablet levothyroxine 100 mcg tablet lisinopriL 10 mg tablet traMADoL 50 mg tablet  Review of Systems  Constitutional: Negative.   HENT: Negative.   Eyes: Negative.   Respiratory: Negative.   Cardiovascular: Negative.   Gastrointestinal: Negative.   Endocrine: Negative.   Genitourinary: Negative.   Musculoskeletal: Positive for back pain.  Skin: Negative.   Neurological: Positive for weakness and numbness.  Hematological: Negative.   Psychiatric/Behavioral: Negative.     There were no vitals taken for this visit. Physical Exam HENT:     Head: Normocephalic.     Right Ear: External ear normal.     Left Ear: External ear normal.     Nose: Nose normal.       Mouth/Throat:     Pharynx: Oropharynx is clear.  Eyes:     Conjunctiva/sclera: Conjunctivae normal.  Cardiovascular:     Rate and Rhythm: Normal rate.     Pulses: Normal pulses.  Pulmonary:     Effort: Pulmonary effort is normal.  Abdominal:     General: Bowel sounds are normal.  Musculoskeletal:     Cervical back: Normal range of motion.     Comments: Patient is an 80 year old female.  Gait and Station:  Appearance: ambulating with no assistive devices and antalgic gait.  Constitutional: General Appearance: healthy-appearing and distress (mild).  Psychiatric: Mood and Affect: active and alert.  Cardiovascular System: Edema Right: none; Dorsalis and posterior tibial pulses 2+. Edema Left: none.  Abdomen: Inspection and Palpation: non-distended and no tenderness.  Skin: Inspection and palpation: no rash.  Lumbar Spine: Inspection: normal alignment. Bony Palpation of the Lumbar Spine: tender at lumbosacral junction.. Bony Palpation of the Right Hip: no tenderness of the greater trochanter and tenderness of the SI joint; Pelvis stable. Bony Palpation of the Left Hip: no tenderness of the greater trochanter and tenderness of the SI joint. Soft Tissue Palpation on the Right: No flank pain with percussion. Active Range of Motion: limited flexion and extention.  Motor Strength: L1 Motor Strength on the Right: hip flexion iliopsoas 5/5. L1 Motor Strength on the Left: hip flexion iliopsoas 5/5. L2-L4 Motor Strength on the Right: knee extension quadriceps 5/5. L2-L4 Motor Strength on the Left: knee extension quadriceps 5/5. L5 Motor Strength on the Right: ankle dorsiflexion tibialis anterior 5/5 and great toe extension extensor hallucis longus 5/5. L5 Motor Strength on the Left: ankle dorsiflexion tibialis anterior 5/5 and great toe extension extensor hallucis longus 5/5. S1 Motor Strength on the Right: plantar flexion gastrocnemius 5/5. S1 Motor Strength on the Left: plantar flexion gastrocnemius 5/5.  Neurological System: Knee Reflex Right: normal (2). Knee Reflex Left: normal (2). Ankle Reflex Right: normal (2). Ankle Reflex Left: normal (2). Babinski Reflex Right: plantar reflex absent. Babinski Reflex Left: plantar reflex absent. Sensation on the Right: normal distal extremities. Sensation on the Left: normal distal extremities. Special Tests on the Right: no clonus of the ankle/knee and seated straight  leg raising test positive. Special Tests on the Left: no clonus of the ankle/knee and seated straight leg raising test positive.  Skin:    General: Skin is warm and dry.  Neurological:     Mental Status: She is alert.   MRI indicates severe spinal stenosis at L4-5 with neuroforaminal narrowing. Moderately severe at L3-4. Moderate at L2-3. And minimal grade 1 spondylolisthesis of L4 and L5.  Osteoporosis   Assessment/Plan Impression:  Neurogenic claudication secondary to spinal stenosis predominately at L4-5 on the left. With a minimal spondylolisthesis at L4-5. Spinal stenosis at L3-4 as well  Plan:  We discussed options including living with her symptoms avoiding extension favor flexion. She indicates this is significant negative affect her activities of daily living in terms of pain  The other option would be a lumbar decompression I would recommend at L4-5 and L 3 4. It would be appropriate to utilize autogenous bone graft in the lateral recesses for lateral mass fusion.  I had an extensive discussion with the patient concerning the pathology relevant anatomy and treatment options. At this point exhausting conservative treatment and in the presence of a neurologic deficit we discussed microlumbar decompression. I discussed the risks and benefits including bleeding, infection, DVT, PE, anesthetic complications, worsening in their symptoms, improvement in  their symptoms, C SF leakage, epidural fibrosis, need for future surgeries such as revision discectomy and lumbar fusion. I also indicated that this is an operation to basically decompress the nerve root to allow recovery as opposed to fixing a herniated disc and that the incidence of recurrent chest disc herniation can approach 15%. Also that nerve root recovery is variable and may not recover completely.  I discussed the operative course including overnight in the hospital. Immediate ambulation. Follow-up in 2 weeks for suture removal. 6  weeks until healing of the herniation followed by 6 weeks of reconditioning and strengthening of the core musculature. Also discussed the need to employ the concepts of disc pressure management and core motion following the surgery to minimize the risk of recurrent disc herniation. We will obtain preoperative clearance i if necessary and proceed accordingly.  Preoperative clearance. Overnight in the hospital.  Plan microlumbar decompression L4-5, L3-4, lateral mass fusion with autograft and allograft bone graft  Dorothy Spark, PA-C for Dr. Shelle Iron 04/14/2020, 12:01 PM

## 2020-04-18 NOTE — Pre-Procedure Instructions (Signed)
[facility] - [place] - [address] [place] [address] Phone: [phone] Fax: [phone]      Your procedure is scheduled on Thursday, January 13th at 7:30 A.M.  Report to [facility] Main Entrance "A" at 5:30 A.M., and check in at the Admitting office.  Call this number if you have problems the morning of surgery:  [phone]  Call [phone] if you have any questions prior to your surgery date Monday-Friday 8am-4pm    Remember:  Do not eat after midnight the night before your surgery  You may drink clear liquids until 4:30 A.M. the morning of your surgery.   Clear liquids allowed are: Water, Non-Citrus Juices (without pulp), Carbonated Beverages, Clear Tea, Black Coffee Only, and Gatorade.   Enhanced Recovery after Surgery for Orthopedics Enhanced Recovery after Surgery is a protocol used to improve the stress on your body and your recovery after surgery.  Patient Instructions  . The night before surgery:  o No food after midnight. ONLY clear liquids after midnight  .  Marland Kitchen The day of surgery (if you do NOT have diabetes):  o Drink ONE (1) Pre-Surgery Clear Ensure by 4:30 am the morning of surgery   o This drink was given to you during your hospital  pre-op appointment visit. o Nothing else to drink after completing the  Pre-Surgery Clear Ensure.         If you have questions, please contact your surgeon's office.     Take these medicines the morning of surgery with A SIP OF WATER  levothyroxine (SYNTHROID, LEVOTHROID)  acetaminophen (TYLENOL)-use as needed.  As of today, STOP taking any Aspirin (unless otherwise instructed by your surgeon) Aleve, Naproxen, Ibuprofen, Motrin, Advil, Goody's, BC's, all herbal medications, fish oil, and all vitamins.                      Do not wear jewelry, make up, or nail polish.            Do not wear lotions, powders, perfumes, or deodorant.             Do not shave 48 hours prior to surgery.              Do not bring valuables to the hospital.            [facility] is not responsible for any belongings or valuables.  Do NOT Smoke (Tobacco/Vaping) or drink Alcohol 24 hours prior to your procedure If you use a CPAP at night, you may bring all equipment for your overnight stay.   Contacts, glasses, dentures or bridgework may not be worn into surgery.      For patients admitted to the hospital, discharge time will be determined by your treatment team.   Patients discharged the day of surgery will not be allowed to drive home, and someone needs to stay with them for 24 hours.    Special instructions:   [place]- Preparing For Surgery  Before surgery, you can play an important role. Because skin is not sterile, your skin needs to be as free of germs as possible. You can reduce the number of germs on your skin by washing with CHG (chlorahexidine gluconate) Soap before surgery.  CHG is an antiseptic cleaner which kills germs and bonds with the skin to continue killing germs even after washing.    Oral Hygiene is also important to reduce your risk  of infection.  Remember - BRUSH YOUR TEETH THE MORNING OF SURGERY WITH YOUR REGULAR TOOTHPASTE  Please do not use if you have an allergy to CHG or antibacterial soaps. If your skin becomes reddened/irritated stop using the CHG.  Do not shave (including legs and underarms) for at least 48 hours prior to first CHG shower. It is OK to shave your face.  Please follow these instructions carefully.   1. Shower the NIGHT BEFORE SURGERY and the MORNING OF SURGERY with CHG Soap.   2. If you chose to wash your hair, wash your hair first as usual with your normal shampoo.  3. After you shampoo, rinse your hair and body thoroughly to remove the shampoo.  4. Use CHG as you would any other liquid soap. You can apply CHG directly to the skin and wash gently with a scrungie or a clean washcloth.   5. Apply  the CHG Soap to your body ONLY FROM THE NECK DOWN.  Do not use on open wounds or open sores. Avoid contact with your eyes, ears, mouth and genitals (private parts). Wash Face and genitals (private parts)  with your normal soap.   6. Wash thoroughly, paying special attention to the area where your surgery will be performed.  7. Thoroughly rinse your body with warm water from the neck down.  8. DO NOT shower/wash with your normal soap after using and rinsing off the CHG Soap.  9. Pat yourself dry with a CLEAN TOWEL.  10. Wear CLEAN PAJAMAS to bed the night before surgery  11. Place CLEAN SHEETS on your bed the night of your first shower and DO NOT SLEEP WITH PETS.   Day of Surgery: Wear Clean/Comfortable clothing the morning of surgery Do not apply any deodorants/lotions.   Remember to brush your teeth WITH YOUR REGULAR TOOTHPASTE.   Please read over the following fact sheets that you were given.

## 2020-04-21 ENCOUNTER — Ambulatory Visit (HOSPITAL_COMMUNITY)
Admission: RE | Admit: 2020-04-21 | Discharge: 2020-04-21 | Disposition: A | Discharge status: Home / Self Care | Payer: PPO | Source: Ambulatory Visit | Attending: Orthopedic Surgery | Admitting: Orthopedic Surgery

## 2020-04-21 ENCOUNTER — Encounter (HOSPITAL_COMMUNITY)
Admission: RE | Admit: 2020-04-21 | Discharge: 2020-04-21 | Disposition: A | Discharge status: Home / Self Care | Payer: PPO | Source: Ambulatory Visit | Attending: Specialist | Admitting: Specialist

## 2020-04-21 ENCOUNTER — Encounter (HOSPITAL_COMMUNITY): Payer: Self-pay

## 2020-04-21 ENCOUNTER — Other Ambulatory Visit: Payer: Self-pay

## 2020-04-21 DIAGNOSIS — M2578 Osteophyte, vertebrae: Secondary | ICD-10-CM | POA: Diagnosis not present

## 2020-04-21 DIAGNOSIS — M5126 Other intervertebral disc displacement, lumbar region: Secondary | ICD-10-CM

## 2020-04-21 DIAGNOSIS — M47816 Spondylosis without myelopathy or radiculopathy, lumbar region: Secondary | ICD-10-CM | POA: Diagnosis not present

## 2020-04-21 HISTORY — DX: Unspecified hearing loss, unspecified ear: H91.90

## 2020-04-21 HISTORY — DX: Presence of dental prosthetic device (complete) (partial): Z97.2

## 2020-04-21 LAB — BASIC METABOLIC PANEL
Anion gap: 11 (ref 5–15)
BUN: 24 mg/dL — ABNORMAL HIGH (ref 8–23)
CO2: 26 mmol/L (ref 22–32)
Calcium: 9.6 mg/dL (ref 8.9–10.3)
Chloride: 99 mmol/L (ref 98–111)
Creatinine, Ser: 0.81 mg/dL (ref 0.44–1.00)
GFR, Estimated: >60 mL/min (ref >60)
Glucose, Bld: 104 mg/dL — ABNORMAL HIGH (ref 70–99)
Potassium: 3.9 mmol/L (ref 3.5–5.1)
Sodium: 136 mmol/L (ref 135–145)

## 2020-04-21 LAB — CBC
HCT: 39.4 % (ref 36.0–46.0)
Hemoglobin: 12.8 g/dL (ref 12.0–15.0)
MCH: 29.1 pg (ref 26.0–34.0)
MCHC: 32.5 g/dL (ref 30.0–36.0)
MCV: 89.5 fL (ref 80.0–100.0)
Platelets: 219 10*3/uL (ref 150–400)
RBC: 4.4 MIL/uL (ref 3.87–5.11)
RDW: 13.1 % (ref 11.5–15.5)
WBC: 6 10*3/uL (ref 4.0–10.5)
nRBC: 0 % (ref 0.0–0.2)

## 2020-04-21 LAB — TYPE AND SCREEN
ABO/RH(D): O POS
Antibody Screen: NEGATIVE

## 2020-04-21 LAB — SURGICAL PCR SCREEN
MRSA, PCR: NEGATIVE
Staphylococcus aureus: NEGATIVE

## 2020-04-21 NOTE — Progress Notes (Signed)
PCP - Lonie Peak, PA Cardiologist - n/a  Chest x-ray - 04/21/20 EKG - 03/24/20 Requested  Stress Test - n/a ECHO - n/a Cardiac Cath - n/a  ERAS: Clears til 0430 DOS, Drink given at PAT appt.  EKG of 04/01/20 requested from Lonie Peak, NP [phone] via phone.  Spoke with [employer].  Coronavirus Screening Covid test on 02/24/20 was positive. Do you have any of the following symptoms:  Cough yes/no: No Fever (>100.30F)  yes/no: No Runny nose yes/no: No Sore throat yes/no: No Difficulty breathing/shortness of breath  yes/no: No  Have you traveled in the last 14 days and where? yes/no: No  Patient verbalized understanding of instructions that were given to them at the PAT appointment.

## 2020-04-23 NOTE — Anesthesia Preprocedure Evaluation (Addendum)
Anesthesia Evaluation  Patient identified by MRN, date of birth, ID band Patient awake    Reviewed: Allergy & Precautions, H&P , NPO status , Patient's Chart, lab work & pertinent test results  Airway Mallampati: II  TM Distance: >3 FB Neck ROM: Full    Dental no notable dental hx. (+) Edentulous Upper, Edentulous Lower, Dental Advisory Given   Pulmonary neg pulmonary ROS,    Pulmonary exam normal breath sounds clear to auscultation       Cardiovascular Exercise Tolerance: Good hypertension, Pt. on medications  Rhythm:Regular Rate:Normal     Neuro/Psych negative neurological ROS  negative psych ROS   GI/Hepatic negative GI ROS, Neg liver ROS,   Endo/Other  diabetesHypothyroidism   Renal/GU negative Renal ROS  negative genitourinary   Musculoskeletal   Abdominal   Peds  Hematology negative hematology ROS (+)   Anesthesia Other Findings   Reproductive/Obstetrics negative OB ROS                            Anesthesia Physical Anesthesia Plan  ASA: II  Anesthesia Plan: General   Post-op Pain Management:    Induction: Intravenous  PONV Risk Score and Plan: 4 or greater and Ondansetron, Dexamethasone and Midazolam  Airway Management Planned: Oral ETT  Additional Equipment:   Intra-op Plan:   Post-operative Plan: Extubation in OR  Informed Consent: I have reviewed the patients History and Physical, chart, labs and discussed the procedure including the risks, benefits and alternatives for the proposed anesthesia with the patient or authorized representative who has indicated his/her understanding and acceptance.     Dental advisory given  Plan Discussed with: CRNA  Anesthesia Plan Comments:        Anesthesia Quick Evaluation

## 2020-04-24 ENCOUNTER — Ambulatory Visit (HOSPITAL_COMMUNITY): Payer: PPO | Admitting: Anesthesiology

## 2020-04-24 ENCOUNTER — Encounter (HOSPITAL_COMMUNITY)
Admission: RE | Disposition: A | Discharge status: Home / Self Care | Payer: Self-pay | Source: Home / Self Care | Attending: Specialist

## 2020-04-24 ENCOUNTER — Ambulatory Visit (HOSPITAL_COMMUNITY)
Admission: RE | Admit: 2020-04-24 | Discharge: 2020-04-25 | Disposition: A | Discharge status: Home / Self Care | Payer: PPO | Attending: Specialist | Admitting: Specialist

## 2020-04-24 ENCOUNTER — Ambulatory Visit (HOSPITAL_COMMUNITY): Payer: PPO

## 2020-04-24 ENCOUNTER — Encounter (HOSPITAL_COMMUNITY): Payer: Self-pay | Admitting: Specialist

## 2020-04-24 ENCOUNTER — Other Ambulatory Visit: Payer: Self-pay

## 2020-04-24 DIAGNOSIS — M48062 Spinal stenosis, lumbar region with neurogenic claudication: Secondary | ICD-10-CM | POA: Diagnosis not present

## 2020-04-24 DIAGNOSIS — Z419 Encounter for procedure for purposes other than remedying health state, unspecified: Secondary | ICD-10-CM

## 2020-04-24 DIAGNOSIS — M48061 Spinal stenosis, lumbar region without neurogenic claudication: Secondary | ICD-10-CM | POA: Diagnosis present

## 2020-04-24 DIAGNOSIS — Z9889 Other specified postprocedural states: Secondary | ICD-10-CM | POA: Diagnosis not present

## 2020-04-24 DIAGNOSIS — M4316 Spondylolisthesis, lumbar region: Secondary | ICD-10-CM | POA: Diagnosis not present

## 2020-04-24 DIAGNOSIS — E876 Hypokalemia: Secondary | ICD-10-CM | POA: Diagnosis not present

## 2020-04-24 DIAGNOSIS — E039 Hypothyroidism, unspecified: Secondary | ICD-10-CM | POA: Diagnosis not present

## 2020-04-24 DIAGNOSIS — Z880 Allergy status to penicillin: Secondary | ICD-10-CM | POA: Diagnosis not present

## 2020-04-24 DIAGNOSIS — Z885 Allergy status to narcotic agent status: Secondary | ICD-10-CM | POA: Diagnosis not present

## 2020-04-24 DIAGNOSIS — I10 Essential (primary) hypertension: Secondary | ICD-10-CM | POA: Diagnosis not present

## 2020-04-24 HISTORY — PX: LUMBAR LAMINECTOMY/DECOMPRESSION MICRODISCECTOMY: SHX5026

## 2020-04-24 LAB — GLUCOSE, CAPILLARY
Glucose-Capillary: 139 mg/dL — ABNORMAL HIGH (ref 70–99)
Glucose-Capillary: 153 mg/dL — ABNORMAL HIGH (ref 70–99)
Glucose-Capillary: 212 mg/dL — ABNORMAL HIGH (ref 70–99)
Glucose-Capillary: 139 mg/dL — ABNORMAL HIGH (ref 70–99)

## 2020-04-24 SURGERY — LUMBAR LAMINECTOMY/DECOMPRESSION MICRODISCECTOMY 2 LEVELS
Anesthesia: General | Site: Spine Lumbar

## 2020-04-24 MED ORDER — THROMBIN 20000 UNITS EX SOLR
CUTANEOUS | Status: DC | PRN
  Administered 2020-04-24: 20 mL via TOPICAL

## 2020-04-24 MED ORDER — HYDROCODONE-ACETAMINOPHEN 5-325 MG PO TABS: 2.0000 | ORAL_TABLET | ORAL | Status: DC | PRN

## 2020-04-24 MED ORDER — ACETAMINOPHEN 500 MG PO TABS: 1000.0000 mg | ORAL_TABLET | Freq: Once | ORAL | Status: DC

## 2020-04-24 MED ORDER — LIDOCAINE 2% (20 MG/ML) 5 ML SYRINGE
INTRAMUSCULAR | Status: DC | PRN
  Administered 2020-04-24: 60 mg via INTRAVENOUS

## 2020-04-24 MED ORDER — ONDANSETRON HCL 4 MG/2ML IJ SOLN
INTRAMUSCULAR | Status: DC | PRN
  Administered 2020-04-24: 4 mg via INTRAVENOUS

## 2020-04-24 MED ORDER — HYDROMORPHONE HCL 1 MG/ML IJ SOLN: 0.5000 mg | INTRAMUSCULAR | Status: DC | PRN

## 2020-04-24 MED ORDER — LACTATED RINGERS IV SOLN: INTRAVENOUS | Status: DC

## 2020-04-24 MED ORDER — FENTANYL CITRATE (PF) 250 MCG/5ML IJ SOLN
INTRAMUSCULAR | Status: DC | PRN
  Administered 2020-04-24: 25 ug via INTRAVENOUS
  Administered 2020-04-24 (×2): 50 ug via INTRAVENOUS
  Administered 2020-04-24 (×2): 25 ug via INTRAVENOUS

## 2020-04-24 MED ORDER — FENTANYL CITRATE (PF) 100 MCG/2ML IJ SOLN
25.0000 ug | INTRAMUSCULAR | Status: DC | PRN
  Administered 2020-04-24: 50 ug via INTRAVENOUS

## 2020-04-24 MED ORDER — PROPOFOL 10 MG/ML IV BOLUS
INTRAVENOUS | Status: AC
  Filled 2020-04-24: qty 40

## 2020-04-24 MED ORDER — PHENYLEPHRINE HCL-NACL 10-0.9 MG/250ML-% IV SOLN
INTRAVENOUS | Status: DC | PRN
  Administered 2020-04-24: 25 ug/min via INTRAVENOUS

## 2020-04-24 MED ORDER — BUPIVACAINE-EPINEPHRINE 0.5% -1:200000 IJ SOLN
INTRAMUSCULAR | Status: DC | PRN
  Administered 2020-04-24: 5 mL

## 2020-04-24 MED ORDER — EPHEDRINE SULFATE-NACL 50-0.9 MG/10ML-% IV SOSY
PREFILLED_SYRINGE | INTRAVENOUS | Status: DC | PRN
  Administered 2020-04-24 (×6): 5 mg via INTRAVENOUS

## 2020-04-24 MED ORDER — HYDROCODONE-ACETAMINOPHEN 5-325 MG PO TABS: 1.0000 | ORAL_TABLET | ORAL | 0 refills | Status: DC | PRN

## 2020-04-24 MED ORDER — ONDANSETRON HCL 4 MG/2ML IJ SOLN: 4.0000 mg | Freq: Four times a day (QID) | INTRAMUSCULAR | Status: DC | PRN

## 2020-04-24 MED ORDER — ACETAMINOPHEN 10 MG/ML IV SOLN
1000.0000 mg | INTRAVENOUS | Status: AC
  Administered 2020-04-24: 1000 mg via INTRAVENOUS
  Filled 2020-04-24: qty 100

## 2020-04-24 MED ORDER — VANCOMYCIN HCL IN DEXTROSE 750-5 MG/150ML-% IV SOLN
750.0000 mg | Freq: Once | INTRAVENOUS | Status: AC
  Administered 2020-04-24: 750 mg via INTRAVENOUS
  Filled 2020-04-24: qty 150

## 2020-04-24 MED ORDER — CHLORHEXIDINE GLUCONATE 0.12 % MT SOLN
15.0000 mL | Freq: Once | OROMUCOSAL | Status: AC
  Administered 2020-04-24: 15 mL via OROMUCOSAL
  Filled 2020-04-24: qty 15

## 2020-04-24 MED ORDER — DOCUSATE SODIUM 100 MG PO CAPS
100.0000 mg | ORAL_CAPSULE | Freq: Two times a day (BID) | ORAL | Status: DC
  Administered 2020-04-24 (×2): 100 mg via ORAL
  Filled 2020-04-24 (×2): qty 1

## 2020-04-24 MED ORDER — PHENOL 1.4 % MT LIQD: 1.0000 | OROMUCOSAL | Status: DC | PRN

## 2020-04-24 MED ORDER — ORAL CARE MOUTH RINSE: 15.0000 mL | Freq: Once | OROMUCOSAL | Status: AC

## 2020-04-24 MED ORDER — POTASSIUM CHLORIDE IN NACL 20-0.45 MEQ/L-% IV SOLN
INTRAVENOUS | Status: DC
  Filled 2020-04-24: qty 1000

## 2020-04-24 MED ORDER — ONDANSETRON HCL 4 MG/2ML IJ SOLN
INTRAMUSCULAR | Status: AC
  Filled 2020-04-24: qty 2

## 2020-04-24 MED ORDER — GENTAMICIN IN SALINE 1.2-0.9 MG/ML-% IV SOLN
60.0000 mg | INTRAVENOUS | Status: AC
  Administered 2020-04-24: 60 mg via INTRAVENOUS
  Filled 2020-04-24 (×2): qty 50

## 2020-04-24 MED ORDER — MENTHOL 3 MG MT LOZG: 1.0000 | LOZENGE | OROMUCOSAL | Status: DC | PRN

## 2020-04-24 MED ORDER — ONDANSETRON HCL 4 MG PO TABS: 4.0000 mg | ORAL_TABLET | Freq: Four times a day (QID) | ORAL | Status: DC | PRN

## 2020-04-24 MED ORDER — FENTANYL CITRATE (PF) 250 MCG/5ML IJ SOLN
INTRAMUSCULAR | Status: AC
  Filled 2020-04-24: qty 5

## 2020-04-24 MED ORDER — ROCURONIUM BROMIDE 10 MG/ML (PF) SYRINGE
PREFILLED_SYRINGE | INTRAVENOUS | Status: AC
  Filled 2020-04-24: qty 10

## 2020-04-24 MED ORDER — FENTANYL CITRATE (PF) 100 MCG/2ML IJ SOLN
INTRAMUSCULAR | Status: AC
  Filled 2020-04-24: qty 2

## 2020-04-24 MED ORDER — LIDOCAINE 2% (20 MG/ML) 5 ML SYRINGE
INTRAMUSCULAR | Status: AC
  Filled 2020-04-24: qty 5

## 2020-04-24 MED ORDER — THROMBIN 20000 UNITS EX SOLR
CUTANEOUS | Status: AC
  Filled 2020-04-24: qty 20000

## 2020-04-24 MED ORDER — ALUM & MAG HYDROXIDE-SIMETH 200-200-20 MG/5ML PO SUSP: 30.0000 mL | Freq: Four times a day (QID) | ORAL | Status: DC | PRN

## 2020-04-24 MED ORDER — EPHEDRINE 5 MG/ML INJ
INTRAVENOUS | Status: AC
  Filled 2020-04-24: qty 10

## 2020-04-24 MED ORDER — ACETAMINOPHEN 325 MG PO TABS: 650.0000 mg | ORAL_TABLET | ORAL | Status: DC | PRN

## 2020-04-24 MED ORDER — DOCUSATE SODIUM 100 MG PO CAPS: 100.0000 mg | ORAL_CAPSULE | Freq: Two times a day (BID) | ORAL | 1 refills | Status: DC | PRN

## 2020-04-24 MED ORDER — CELECOXIB 200 MG PO CAPS
200.0000 mg | ORAL_CAPSULE | Freq: Once | ORAL | Status: AC
  Administered 2020-04-24: 200 mg via ORAL
  Filled 2020-04-24: qty 1

## 2020-04-24 MED ORDER — SUGAMMADEX SODIUM 200 MG/2ML IV SOLN
INTRAVENOUS | Status: DC | PRN
  Administered 2020-04-24: 200 mg via INTRAVENOUS

## 2020-04-24 MED ORDER — HYDROCODONE-ACETAMINOPHEN 5-325 MG PO TABS
1.0000 | ORAL_TABLET | ORAL | Status: DC | PRN
Start: 2020-04-24 — End: 2020-04-25

## 2020-04-24 MED ORDER — BISACODYL 5 MG PO TBEC: 5.0000 mg | DELAYED_RELEASE_TABLET | Freq: Every day | ORAL | Status: DC | PRN

## 2020-04-24 MED ORDER — DEXAMETHASONE SODIUM PHOSPHATE 10 MG/ML IJ SOLN
INTRAMUSCULAR | Status: DC | PRN
  Administered 2020-04-24: 5 mg via INTRAVENOUS

## 2020-04-24 MED ORDER — POLYETHYLENE GLYCOL 3350 17 G PO PACK: 17.0000 g | PACK | Freq: Every day | ORAL | Status: DC | PRN

## 2020-04-24 MED ORDER — ACETAMINOPHEN 500 MG PO TABS
1000.0000 mg | ORAL_TABLET | Freq: Four times a day (QID) | ORAL | Status: AC
  Administered 2020-04-24 – 2020-04-25 (×4): 1000 mg via ORAL
  Filled 2020-04-24 (×4): qty 2

## 2020-04-24 MED ORDER — LISINOPRIL 10 MG PO TABS
10.0000 mg | ORAL_TABLET | Freq: Every day | ORAL | Status: DC
Start: 2020-04-25 — End: 2020-04-25

## 2020-04-24 MED ORDER — POLYETHYLENE GLYCOL 3350 17 G PO PACK: 17.0000 g | PACK | Freq: Every day | ORAL | 0 refills | Status: DC

## 2020-04-24 MED ORDER — VANCOMYCIN HCL IN DEXTROSE 1-5 GM/200ML-% IV SOLN
1000.0000 mg | INTRAVENOUS | Status: AC
  Administered 2020-04-24: 1000 mg via INTRAVENOUS
  Filled 2020-04-24: qty 200

## 2020-04-24 MED ORDER — DEXAMETHASONE SODIUM PHOSPHATE 10 MG/ML IJ SOLN
INTRAMUSCULAR | Status: AC
  Filled 2020-04-24: qty 1

## 2020-04-24 MED ORDER — MAGNESIUM CITRATE PO SOLN: 1.0000 | Freq: Once | ORAL | Status: DC | PRN

## 2020-04-24 MED ORDER — KCL IN DEXTROSE-NACL 20-5-0.45 MEQ/L-%-% IV SOLN: INTRAVENOUS | Status: AC

## 2020-04-24 MED ORDER — PROPOFOL 10 MG/ML IV BOLUS
INTRAVENOUS | Status: DC | PRN
Start: 2020-04-24 — End: 2020-04-24
  Administered 2020-04-24: 50 mg via INTRAVENOUS

## 2020-04-24 MED ORDER — METHOCARBAMOL 500 MG PO TABS: 500.0000 mg | ORAL_TABLET | Freq: Four times a day (QID) | ORAL | Status: DC | PRN

## 2020-04-24 MED ORDER — ROCURONIUM BROMIDE 10 MG/ML (PF) SYRINGE
PREFILLED_SYRINGE | INTRAVENOUS | Status: DC | PRN
  Administered 2020-04-24: 10 mg via INTRAVENOUS
  Administered 2020-04-24: 20 mg via INTRAVENOUS
  Administered 2020-04-24: 40 mg via INTRAVENOUS
  Administered 2020-04-24: 20 mg via INTRAVENOUS

## 2020-04-24 MED ORDER — LEVOTHYROXINE SODIUM 100 MCG PO TABS
100.0000 ug | ORAL_TABLET | Freq: Every day | ORAL | Status: DC
  Administered 2020-04-25: 100 ug via ORAL
  Filled 2020-04-24: qty 1

## 2020-04-24 MED ORDER — METHOCARBAMOL 1000 MG/10ML IJ SOLN
500.0000 mg | Freq: Four times a day (QID) | INTRAVENOUS | Status: DC | PRN
  Filled 2020-04-24: qty 5

## 2020-04-24 MED ORDER — ACETAMINOPHEN 650 MG RE SUPP: 650.0000 mg | RECTAL | Status: DC | PRN

## 2020-04-24 MED ORDER — 0.9 % SODIUM CHLORIDE (POUR BTL) OPTIME
TOPICAL | Status: DC | PRN
  Administered 2020-04-24: 1000 mL

## 2020-04-24 SURGICAL SUPPLY — 59 items
BAG DECANTER FOR FLEXI CONT (MISCELLANEOUS) ×2 IMPLANT
BAND INSRT 18 STRL LF DISP RB (MISCELLANEOUS) ×2
BAND RUBBER #18 3X1/16 STRL (MISCELLANEOUS) ×4 IMPLANT
BONE VIVIGEN FORMABLE 5.4CC (Bone Implant) ×2 IMPLANT
CATH FOLEY 2WAY SLVR  5CC 14FR (CATHETERS) ×2
CATH FOLEY 2WAY SLVR 5CC 14FR (CATHETERS) ×1 IMPLANT
CLEANER TIP ELECTROSURG 2X2 (MISCELLANEOUS) ×2 IMPLANT
CNTNR URN SCR LID CUP LEK RST (MISCELLANEOUS) ×1 IMPLANT
CONT SPEC 4OZ STRL OR WHT (MISCELLANEOUS) ×2
COVER WAND RF STERILE (DRAPES) IMPLANT
DRAPE LAPAROTOMY 100X72X124 (DRAPES) ×2 IMPLANT
DRAPE MICROSCOPE LEICA (MISCELLANEOUS) ×2 IMPLANT
DRAPE SHEET LG 3/4 BI-LAMINATE (DRAPES) ×2 IMPLANT
DRAPE SURG 17X11 SM STRL (DRAPES) IMPLANT
DRAPE UTILITY XL STRL (DRAPES) ×2 IMPLANT
DRSG AQUACEL AG ADV 3.5X 4 (GAUZE/BANDAGES/DRESSINGS) IMPLANT
DRSG AQUACEL AG ADV 3.5X 6 (GAUZE/BANDAGES/DRESSINGS) ×2 IMPLANT
DRSG TELFA 3X8 NADH (GAUZE/BANDAGES/DRESSINGS) IMPLANT
DURAPREP 26ML APPLICATOR (WOUND CARE) ×2 IMPLANT
DURASEAL SPINE SEALANT 3ML (MISCELLANEOUS) IMPLANT
ELECT BLADE 4.0 EZ CLEAN MEGAD (MISCELLANEOUS) ×2
ELECT REM PT RETURN 9FT ADLT (ELECTROSURGICAL) ×2
ELECTRODE BLDE 4.0 EZ CLN MEGD (MISCELLANEOUS) ×1 IMPLANT
ELECTRODE REM PT RTRN 9FT ADLT (ELECTROSURGICAL) ×1 IMPLANT
GLOVE BIOGEL PI IND STRL 7.0 (GLOVE) ×1 IMPLANT
GLOVE BIOGEL PI INDICATOR 7.0 (GLOVE) ×1
GLOVE SURG SS PI 7.5 STRL IVOR (GLOVE) ×4 IMPLANT
GLOVE SURG SS PI 8.0 STRL IVOR (GLOVE) ×4 IMPLANT
GOWN STRL REUS W/ TWL LRG LVL3 (GOWN DISPOSABLE) ×1 IMPLANT
GOWN STRL REUS W/ TWL XL LVL3 (GOWN DISPOSABLE) ×2 IMPLANT
GOWN STRL REUS W/TWL LRG LVL3 (GOWN DISPOSABLE) ×2
GOWN STRL REUS W/TWL XL LVL3 (GOWN DISPOSABLE) ×4
IV CATH 14GX2 1/4 (CATHETERS) ×2 IMPLANT
KIT BASIN OR (CUSTOM PROCEDURE TRAY) ×2 IMPLANT
KIT POSITION SURG JACKSON T1 (MISCELLANEOUS) IMPLANT
MILL MEDIUM DISP (BLADE) ×2 IMPLANT
NEEDLE 22X1 1/2 (OR ONLY) (NEEDLE) ×2 IMPLANT
NEEDLE SPNL 18GX3.5 QUINCKE PK (NEEDLE) ×4 IMPLANT
PACK LAMINECTOMY NEURO (CUSTOM PROCEDURE TRAY) ×2 IMPLANT
PATTIES SURGICAL .75X.75 (GAUZE/BANDAGES/DRESSINGS) ×2 IMPLANT
SPONGE LAP 4X18 RFD (DISPOSABLE) ×4 IMPLANT
SPONGE SURGIFOAM ABS GEL 100 (HEMOSTASIS) ×2 IMPLANT
STAPLER VISISTAT (STAPLE) ×2 IMPLANT
STRIP CLOSURE SKIN 1/2X4 (GAUZE/BANDAGES/DRESSINGS) ×2 IMPLANT
SUT BONE WAX W31G (SUTURE) ×2 IMPLANT
SUT NURALON 4 0 TR CR/8 (SUTURE) IMPLANT
SUT PROLENE 3 0 PS 2 (SUTURE) IMPLANT
SUT VIC AB 1 CT1 27 (SUTURE) ×2
SUT VIC AB 1 CT1 27XBRD ANTBC (SUTURE) ×1 IMPLANT
SUT VIC AB 1 CTB1 27 (SUTURE) ×4 IMPLANT
SUT VIC AB 1-0 CT2 27 (SUTURE) IMPLANT
SUT VIC AB 2-0 CT1 27 (SUTURE) ×2
SUT VIC AB 2-0 CT1 TAPERPNT 27 (SUTURE) ×1 IMPLANT
SUT VIC AB 2-0 CT2 27 (SUTURE) IMPLANT
SYR 3ML LL SCALE MARK (SYRINGE) ×2 IMPLANT
TOWEL GREEN STERILE (TOWEL DISPOSABLE) ×2 IMPLANT
TOWEL GREEN STERILE FF (TOWEL DISPOSABLE) ×2 IMPLANT
TRAY FOLEY MTR SLVR 16FR STAT (SET/KITS/TRAYS/PACK) IMPLANT
YANKAUER SUCT BULB TIP NO VENT (SUCTIONS) ×2 IMPLANT

## 2020-04-24 NOTE — Op Note (Signed)
NAME: [name], PICKART MEDICAL RECORD QI:[id number] ACCOUNT [id number] DATE OF BIRTH:Mar 28, 1941 FACILITY: [facility] LOCATION: [facility] PHYSICIAN:Alexza Norbeck Connye Burkitt, MD  OPERATIVE REPORT  DATE OF PROCEDURE:  04/24/2020  PREOPERATIVE DIAGNOSIS:  Spinal stenosis, L3-L4 and L4-L5.  POSTOPERATIVE DIAGNOSIS:  Spinal stenosis, L3-L4 and L4-L5.  PROCEDURES PERFORMED: 1.  Microlumbar decompression, L3-L4 and L4-L5, with bilateral foraminotomies, L4-L5. 2.  Gill laminectomy of L4.  ANESTHESIA:  General.  ASSISTANT:  Andrez Grime, PA  HISTORY:  An 80 year old with lower extremity radicular pain, left predominantly or right.  She had severe stenosis at L4-L5, moderate to severe at L3-L4.  She has slight offset at L4-L5.  We discussed about possible fusion with autologous bone graft.   We discussed microlumbar decompression at those levels.  Risks and benefits were discussed including bleeding, infection, damage to neurovascular structures, no change in symptoms, worsening symptoms, DVT, PE, anesthetic complications, etc.  DESCRIPTION OF PROCEDURE:  With the patient in supine position, after induction of adequate general anesthesia and Foley to gravity, she was placed prone on the Wilson frame.  All bony prominences well padded.  Lumbar region was prepped and draped in the  usual sterile fashion.  Two 18-gauge spinal needles were utilized to localize L3-L4 and L4-L5 interspace, confirmed with x-rays.  Incision was made from the spinous process of L3 to below the spinous process of L4 to below L5.  Subcutaneous tissue was  dissected.  Electrocautery was utilized to achieve hemostasis.  0.25% Marcaine with epinephrine was infiltrated in subcutaneous tissue.  Dorsal lumbar fascia divided in line with the skin incision.  Paraspinous muscle elevated from lamina of L3-L4 and  L4-L5.  McCullough retractor was placed.  Kochers were placed between L3-L4 and L4-L5, confirmed with x-ray.  Following this, a Leksell  rongeur was utilized to remove the spinous processes of L4 and L5, partial of L3.  Bone wax placed on the cancellous  surfaces.  Operating microscope was draped and brought in the surgical field.  The patient had a very small interlaminar space at L4-L5.  We entered the facet at L4-L5 on the left and began a hemilaminotomy of the caudad edge of L4 with the inferior  articulating process of L4.  We continued cephalad and across the midline at L4-L5.  There was hypertrophic ligamentum flavum removed from the interspace.  Following this, a straight curette utilized to detach ligamentum flavum from the cephalad edge of  L5.  Superior articulating process of L5 was noted on the left.  I used a micro curette to detach ligamentum flavum from the superior articulating process of L5.  I used a Penfield 4 then to develop a plane between the L5 nerve root and the lateral  recess.  I decompressed the lateral recess to the medial border of the pedicle.  Following this, protecting the L5 root, we performed a foraminotomy at L5 that was significantly stenotic.  Following this, a Woodson probe passed freely out the foramen of  L5, but not of L4.  Bone wax was placed on cancellous surfaces.  The patient had generalized bone bleeding throughout the case.  I then completed the hemilaminotomy of the caudad edge of L4 on the right.  We removed ligamentum flavum from the interspace  utilizing a Woodson retractor to develop a plane between the thecal sac and ligamentum flavum, which was hypertrophic bilaterally.  I decompressed the lateral recess on the right to the medial border of the pedicle after protecting the L5 root.  It was  not  as stenotic on the right as the left.  Foraminotomy of L5 was performed as well.  No disk herniation was noted.  I continued cephalad to remove the lamina of L4.  Hypertrophic ligamentum was noted at L3-L4.  This was removed with a straight pituitary  and a 3 mm Kerrison.  The thecal sac was  protected at all times with a neuro patty and Woodson retractor.  Then, from the opposite side of the operating room table, then performed a foraminotomy of the L4 protecting the L4 nerve root.  This was stenotic  as well.  Bipolar cautery was utilized to achieve hemostasis.  Bone wax again was placed on cancellous surfaces after removal of the bone.  This occurred throughout.  We had requested from anesthesia to reduce her blood pressure to decrease the  cancellous bone bleeding; however, they indicated due to her age that it would require the blood pressure at present.  So, we continued with a combination of thrombin-soaked Gelfoam and bone wax on the cancellous surface.  Following this foraminotomy and  decompression, Woodson probe passed freely out the foramen of L4 and above the pedicle of L4 to L3-L4.  I placed Woodson retractor at L3-L4 and at the foramen of L5 and foramen of L4 and obtained a confirmatory radiograph.  Next, we meticulously used  bipolar cautery and copious portion of the bone wax to achieve strict hemostasis.  We also used thrombin-soaked Gelfoam.  Copiously irrigated the wound.  Inspection revealed no evidence of CSF leakage or active bleeding following that.  The L4-L5 space  appeared to be stable at this point and was at the beginning of the case with inspection.  The addition of the lateral mass fusion, which would most likely encounter significant bleeding, I felt that this was not appropriate at this point, and we did not  perform the lateral mass fusion.  After copious irrigation, a small 2 mm x 2 mm piece of thrombin-soaked Gelfoam was placed in the lateral recess at L4-L5 on the left and in the up cephalad.  I removed the McCullough retractor, irrigated the paraspinous  musculature, achieved strict hemostasis with electrocautery.  Following this, I felt we had achieved hemostasis.  I therefore reapproximated the dorsal lumbar fascia with 1 Vicryl interrupted figure-of-eight  sutures leaving a small aperture distally in  case there was accumulation of blood for drainage.  Subcutaneous tissue was copiously irrigated, subcutaneous with 2-0 and skin with staples.  Wound was dressed sterilely.  She was placed supine on the hospital bed, extubated without difficulty, and  transported to the recovery room in satisfactory condition.  The patient tolerated the procedure well.  No complications.  Assistant was [employer], [place].  EBL was 200 mL.  IN/NUANCE  D:04/24/2020 T:04/24/2020 JOB:[id number]

## 2020-04-24 NOTE — Evaluation (Signed)
Physical Therapy Evaluation and Discharge Patient Details Name: [name] MRN: [id number] DOB: 09-Dec-1940 Today's Date: 04/24/2020   History of Present Illness  Pt is an 80 y/o female s/p L3-5 microlumbar decompression. PMH includes DM, HTN, bilateral THA, and L TKA.  Clinical Impression  Patient evaluated by Physical Therapy with no further acute PT needs identified. All education has been completed and the patient has no further questions. Pt overall steady with gait and stair navigation. No LOB noted throughout. Educated about back precautions and generalized walking program. See below for any follow-up Physical Therapy or equipment needs. PT is signing off. Thank you for this referral. If needs change, please re-consult.      Follow Up Recommendations No PT follow up    Equipment Recommendations  None recommended by PT    Recommendations for Other Services       Precautions / Restrictions Precautions Precautions: Back Precaution Booklet Issued: Yes (comment) Precaution Comments: REviewed back precautions with pt. Restrictions Weight Bearing Restrictions: No      Mobility  Bed Mobility Overal bed mobility: Needs Assistance Bed Mobility: Rolling;Sidelying to Sit;Sit to Sidelying Rolling: Supervision Sidelying to sit: Supervision     Sit to sidelying: Supervision General bed mobility comments: Supervision for safety. Cues for log roll technique    Transfers Overall transfer level: Needs assistance Equipment used: None Transfers: Sit to/from Stand Sit to Stand: Supervision         General transfer comment: Supervision for safety.  Ambulation/Gait Ambulation/Gait assistance: Supervision Gait Distance (Feet): 350 Feet Assistive device: None Gait Pattern/deviations: WFL(Within Functional Limits) Gait velocity: WFL   General Gait Details: Overall steady gait with good gait speed. No LOB Noted. Supervision for safety. Educated about generalized walking  program to perform at home.  Stairs Stairs: Yes Stairs assistance: Min guard Stair Management: One rail Right;Step to pattern;Forwards Number of Stairs: 4 General stair comments: Overall steady stair navigation. Min guard for safety only. No LOB Noted.  Wheelchair Mobility    Modified Rankin (Stroke Patients Only)       Balance Overall balance assessment: No apparent balance deficits (not formally assessed)                                           Pertinent Vitals/Pain Pain Assessment: Faces Faces Pain Scale: Hurts a little bit Pain Location: back Pain Descriptors / Indicators: Aching;Operative site guarding Pain Intervention(s): Limited activity within patient's tolerance;Monitored during session;Repositioned    Home Living Family/patient expects to be discharged to:: Private residence Living Arrangements: Alone Available Help at Discharge: Family;Available 24 hours/day Type of Home: House Home Access: Stairs to enter Entrance Stairs-Rails: Lawyer of Steps: 4 Home Layout: One level Home Equipment: Grab bars - tub/shower Additional Comments: Reports daughter plans on staying with her    Prior Function Level of Independence: Independent               Hand Dominance        Extremity/Trunk Assessment   Upper Extremity Assessment Upper Extremity Assessment: Defer to OT evaluation    Lower Extremity Assessment Lower Extremity Assessment: LLE deficits/detail LLE Deficits / Details: Reports numbness from knee down at baseline    Cervical / Trunk Assessment Cervical / Trunk Assessment: Other exceptions Cervical / Trunk Exceptions: s/p lumbar surgery  Communication   Communication: No difficulties  Cognition Arousal/Alertness: Awake/alert Behavior During Therapy:  WFL for tasks assessed/performed Overall Cognitive Status: Within Functional Limits for tasks assessed                                         General Comments General comments (skin integrity, edema, etc.): Educated about performing car transfers while maintaining back precautions    Exercises     Assessment/Plan    PT Assessment Patent does not need any further PT services  PT Problem List         PT Treatment Interventions      PT Goals (Current goals can be found in the Care Plan section)  Acute Rehab PT Goals Patient Stated Goal: to go home PT Goal Formulation: With patient Time For Goal Achievement: 04/24/20 Potential to Achieve Goals: Good    Frequency     Barriers to discharge        Co-evaluation               AM-PAC PT "6 Clicks" Mobility  Outcome Measure Help needed turning from your back to your side while in a flat bed without using bedrails?: None Help needed moving from lying on your back to sitting on the side of a flat bed without using bedrails?: None Help needed moving to and from a bed to a chair (including a wheelchair)?: None Help needed standing up from a chair using your arms (e.g., wheelchair or bedside chair)?: None Help needed to walk in hospital room?: None Help needed climbing 3-5 steps with a railing? : A Little 6 Click Score: 23    End of Session Equipment Utilized During Treatment: Gait belt Activity Tolerance: Patient tolerated treatment well Patient left: in bed;with call bell/phone within reach Nurse Communication: Mobility status PT Visit Diagnosis: Other abnormalities of gait and mobility (R26.89)    Time: 4503-8882 PT Time Calculation (min) (ACUTE ONLY): 16 min   Charges:   PT Evaluation $PT Eval Low Complexity: 1 Low          Cindee Salt, DPT  Acute Rehabilitation Services  Pager: [phone] Office: [phone]   Lehman Prom 04/24/2020, 4:54 PM

## 2020-04-24 NOTE — Transfer of Care (Signed)
Immediate Anesthesia Transfer of Care Note  Patient: EVELLYN TUFF  Procedure(s) Performed: Microlumbar decompression Lumbar Four-Five Lumbar Three-Four lateral mass fusion with autologous/allograft bone (N/A Spine Lumbar)  Patient Location: PACU  Anesthesia Type:General  Level of Consciousness: awake, alert , oriented and patient cooperative  Airway & Oxygen Therapy: Patient Spontanous Breathing and Patient connected to nasal cannula oxygen  Post-op Assessment: Report given to RN, Post -op Vital signs reviewed and stable and Patient moving all extremities X 4  Post vital signs: Reviewed and stable  Last Vitals:  Vitals Value Taken Time  BP 139/53 04/24/20 1043  Temp    Pulse 71 04/24/20 1044  Resp 22 04/24/20 1044  SpO2 100 % 04/24/20 1044  Vitals shown include unvalidated device data.  Last Pain:  Vitals:   04/24/20 0611  TempSrc: Oral  PainSc:       Patients Stated Pain Goal: 3 (04/24/20 0606)  Complications: No complications documented.

## 2020-04-24 NOTE — Progress Notes (Signed)
Pharmacy Antibiotic Note  [name] is a 80 y.o. female s/p spinal procedure.  Pharmacy has been consulted for vancomycin dosing for surgical prophylaxis -SCr= 0.8, CrCl ~ 50 -Vancomycin 1gm given ~ 6:30am today -No drain in place  Plan: -Vancomycin 750mg  IV x1 at 6:30pm  Will sign off. Please contact pharmacy with any other needs.  Thank you , PharmD Clinical Pharmacist **Pharmacist phone directory can now be found on [email or web address] ([email or web address]).  Listed under [facility].

## 2020-04-24 NOTE — Interval H&P Note (Signed)
History and Physical Interval Note:  04/24/2020 7:14 AM  [name]  has presented today for surgery, with the diagnosis of Stenosis L4-5, L3-4.  The various methods of treatment have been discussed with the patient and family. After consideration of risks, benefits and other options for treatment, the patient has consented to  Procedure(s) with comments: Microlumbar decompression L4-5, L3-4 lateral mass fusion with autologous/allograft bone (N/A) - 150 mins as a surgical intervention.  The patient's history has been reviewed, patient examined, no change in status, stable for surgery.  I have reviewed the patient's chart and labs.  Questions were answered to the patient's satisfaction.     Javier Docker

## 2020-04-24 NOTE — Anesthesia Postprocedure Evaluation (Signed)
Anesthesia Post Note  Patient: [name]  Procedure(s) Performed: Microlumbar decompression Lumbar Four-Five Lumbar Three-Four lateral mass fusion with autologous/allograft bone (N/A Spine Lumbar)     Patient location during evaluation: PACU Anesthesia Type: General Level of consciousness: awake and alert Pain management: pain level controlled Vital Signs Assessment: post-procedure vital signs reviewed and stable Respiratory status: spontaneous breathing, nonlabored ventilation and respiratory function stable Cardiovascular status: blood pressure returned to baseline and stable Postop Assessment: no apparent nausea or vomiting Anesthetic complications: no   No complications documented.  Last Vitals:  Vitals:   04/24/20 1113 04/24/20 1153  BP: 123/80 (!) 138/53  Pulse: 67 (!) 53  Resp: 15 17  Temp: 36.8 C   SpO2: 98% 100%    Last Pain:  Vitals:   04/24/20 1113  TempSrc:   PainSc: 3                  Marsel Gail,W. EDMOND

## 2020-04-24 NOTE — Anesthesia Procedure Notes (Signed)
Procedure Name: Intubation Date/Time: 04/24/2020 7:39 AM Performed by: Waynard Edwards, CRNA Pre-anesthesia Checklist: Patient identified, Emergency Drugs available, Suction available and Patient being monitored Patient Re-evaluated:Patient Re-evaluated prior to induction Oxygen Delivery Method: Circle system utilized Preoxygenation: Pre-oxygenation with 100% oxygen Induction Type: IV induction Ventilation: Mask ventilation without difficulty and Oral airway inserted - appropriate to patient size Laryngoscope Size: Miller and 3 Grade View: Grade I Tube type: Oral Tube size: 7.0 mm Number of attempts: 1 Airway Equipment and Method: Stylet and Oral airway Placement Confirmation: ETT inserted through vocal cords under direct vision,  positive ETCO2 and breath sounds checked- equal and bilateral Secured at: 22 cm Tube secured with: Tape Dental Injury: Teeth and Oropharynx as per pre-operative assessment

## 2020-04-24 NOTE — Brief Op Note (Signed)
04/24/2020  10:50 AM  PATIENT:  [name]  80 y.o. female  PRE-OPERATIVE DIAGNOSIS:  Stenosis L4-5, L3-4  POST-OPERATIVE DIAGNOSIS:  Stenosis Lumbar Four-Five, Lumbar Three-Four.  PROCEDURE:  Procedure(s) with comments: Microlumbar decompression Lumbar Four-Five Lumbar Three-Four lateral mass fusion with autologous/allograft bone (N/A) - posterior  SURGEON:  Surgeon(s) and Role:    Jene Every, MD - Primary  PHYSICIAN ASSISTANT:   ASSISTANTS: Bissell   ANESTHESIA:   general  EBL:  250 mL   BLOOD ADMINISTERED:none  DRAINS: none   LOCAL MEDICATIONS USED:  MARCAINE     SPECIMEN:  No Specimen  DISPOSITION OF SPECIMEN:  N/A  COUNTS:  YES  TOURNIQUET:  * No tourniquets in log *  DICTATION: .Other Dictation: Dictation Number [id number]  PLAN OF CARE: Admit for overnight observation  PATIENT DISPOSITION:  PACU - hemodynamically stable.   Delay start of Pharmacological VTE agent (>24hrs) due to surgical blood loss or risk of bleeding: yes

## 2020-04-25 ENCOUNTER — Encounter (HOSPITAL_COMMUNITY): Payer: Self-pay | Admitting: Specialist

## 2020-04-25 DIAGNOSIS — M48062 Spinal stenosis, lumbar region with neurogenic claudication: Secondary | ICD-10-CM | POA: Diagnosis not present

## 2020-04-25 LAB — GLUCOSE, CAPILLARY: Glucose-Capillary: 101 mg/dL — ABNORMAL HIGH (ref 70–99)

## 2020-04-25 LAB — CBC
HCT: 31.1 % — ABNORMAL LOW (ref 36.0–46.0)
Hemoglobin: 10.8 g/dL — ABNORMAL LOW (ref 12.0–15.0)
MCH: 30.4 pg (ref 26.0–34.0)
MCHC: 34.7 g/dL (ref 30.0–36.0)
MCV: 87.6 fL (ref 80.0–100.0)
Platelets: 206 10*3/uL (ref 150–400)
RBC: 3.55 MIL/uL — ABNORMAL LOW (ref 3.87–5.11)
RDW: 13 % (ref 11.5–15.5)
WBC: 8.5 10*3/uL (ref 4.0–10.5)
nRBC: 0 % (ref 0.0–0.2)

## 2020-04-25 LAB — BASIC METABOLIC PANEL
Anion gap: 10 (ref 5–15)
BUN: 17 mg/dL (ref 8–23)
CO2: 25 mmol/L (ref 22–32)
Calcium: 9 mg/dL (ref 8.9–10.3)
Chloride: 101 mmol/L (ref 98–111)
Creatinine, Ser: 0.97 mg/dL (ref 0.44–1.00)
GFR, Estimated: 59 mL/min — ABNORMAL LOW (ref >60)
Glucose, Bld: 104 mg/dL — ABNORMAL HIGH (ref 70–99)
Potassium: 3.8 mmol/L (ref 3.5–5.1)
Sodium: 136 mmol/L (ref 135–145)

## 2020-04-25 MED ORDER — TRAMADOL HCL 50 MG PO TABS
50.0000 mg | ORAL_TABLET | Freq: Four times a day (QID) | ORAL | 0 refills | Status: AC | PRN
Start: 2020-04-25 — End: 2020-05-02

## 2020-04-25 NOTE — Discharge Instructions (Signed)
Walk As Tolerated utilizing back precautions.  No bending, twisting, or lifting.  No driving for 2 weeks.   Aquacel dressing may remain in place until follow up. May shower with aquacel dressing in place. If the dressing peels off or becomes saturated, you may remove aquacel dressing and place gauze and tape dressing which should be kept clean and dry and changed daily. Do not remove steri-strips if they are present. See Dr. Shelle Iron in office in 10 to 14 days. Begin taking aspirin 81mg  per day starting 4 days after your surgery, that will be on Monday 04/28/20 if not allergic to aspirin or on another blood thinner. Walk daily even outside. Use a cane or walker only if necessary. Avoid sitting on soft sofas.

## 2020-04-25 NOTE — Progress Notes (Signed)
Patient alert and oriented, mae's well, voiding adequate amount of urine, swallowing without difficulty, no c/o pain at time of discharge. Patient discharged home with family. Script and discharged instructions given to patient. Patient and family stated understanding of instructions given. Patient has an appointment with Dr. Beane ?

## 2020-04-25 NOTE — Progress Notes (Signed)
Subjective: 1 Day Post-Op Procedure(s) (LRB): Microlumbar decompression Lumbar Four-Five Lumbar Three-Four lateral mass fusion with autologous/allograft bone (N/A) Patient reports pain as mild.  No radicular pain. Taking only Tyl. Pain well controlled. Voiding without difficulty. Ready to go home.  Objective: Vital signs in last 24 hours: Temp:  [97.6 F (36.4 C)-98.4 F (36.9 C)] 98.3 F (36.8 C) (01/14 0742) Pulse Rate:  [53-79] 76 (01/14 0742) Resp:  [10-20] 16 (01/14 0742) BP: (101-139)/(50-80) 123/52 (01/14 0742) SpO2:  [97 %-100 %] 100 % (01/14 0742)  Intake/Output from previous day: 01/13 0701 - 01/14 0700 In: 1440 [P.O.:240; I.V.:1050; IV Piggyback:150] Out: 705 [Urine:455; Blood:250] Intake/Output this shift: No intake/output data recorded.  Recent Labs    04/25/20 0426  HGB 10.8*   Recent Labs    04/25/20 0426  WBC 8.5  RBC 3.55*  HCT 31.1*  PLT 206   Recent Labs    04/25/20 0426  NA 136  K 3.8  CL 101  CO2 25  BUN 17  CREATININE 0.97  GLUCOSE 104*  CALCIUM 9.0   No results for input(s): LABPT, INR in the last 72 hours.  Neurologically intact ABD soft Neurovascular intact Sensation intact distally Intact pulses distally Dorsiflexion/Plantar flexion intact Incision: scant drainage No cellulitis present Compartment soft no sign of DVT   Assessment/Plan: 1 Day Post-Op Procedure(s) (LRB): Microlumbar decompression Lumbar Four-Five Lumbar Three-Four lateral mass fusion with autologous/allograft bone (N/A) Advance diet Up with therapy D/C home today Will discuss with Dr. Shelle Iron Discussed D/Cinstructions, Lspine precautions, dressing instructions  Dorothy Spark 04/25/2020, 8:27 AM

## 2020-04-25 NOTE — Evaluation (Signed)
Occupational Therapy Evaluation & Discharge Patient Details Name: [name] MRN: [id number] DOB: 06-01-1940 Today's Date: 04/25/2020    History of Present Illness Pt is an 80 y/o female s/p L3-5 microlumbar decompression. PMH includes DM, HTN, bilateral THA, and L TKA.   Clinical Impression   PTA patient was living alone in a private residence and was independent with ADLs/IADLs without AD. Patient was still driving. Patient currently functioning at baseline demonstrating ADLs and functional mobility with supervision to Mod I without AD. Education provided on back precautions, home set-up, and acquisition/use AE to maximize adherence. Patient does not require continued acute occupational therapy services with OT to sign off at this time.     Follow Up Recommendations  No OT follow up;Supervision - Intermittent    Equipment Recommendations  None recommended by OT    Recommendations for Other Services       Precautions / Restrictions Precautions Precautions: Back Precaution Booklet Issued: Yes (comment) Precaution Comments: Reviewed back precautions. Occasional cueing for adherence. Restrictions Weight Bearing Restrictions: No      Mobility Bed Mobility Overal bed mobility: Needs Assistance Bed Mobility: Rolling;Sidelying to Sit;Sit to Sidelying Rolling: Modified independent (Device/Increase time) Sidelying to sit: Modified independent (Device/Increase time)       General bed mobility comments: Mod I +rail. Good recall of log rolling technique.    Transfers Overall transfer level: Needs assistance Equipment used: None Transfers: Sit to/from Stand Sit to Stand: Modified independent (Device/Increase time)         General transfer comment: Mod I for sit to stand from EOB and recliner with use of BUE on armrests.    Balance Overall balance assessment: No apparent balance deficits (not formally assessed)                                          ADL either performed or assessed with clinical judgement   ADL Overall ADL's : At baseline                                       General ADL Comments: Patient demonstrates UB/LB bathing/dressing with Mod I and short-distance functional mobility with supervision A to Mod I and no AD.     Vision         Perception     Praxis      Pertinent Vitals/Pain Pain Assessment: No/denies pain Pain Intervention(s): Monitored during session     Hand Dominance Right   Extremity/Trunk Assessment Upper Extremity Assessment Upper Extremity Assessment: Overall WFL for tasks assessed       Cervical / Trunk Assessment Cervical / Trunk Assessment: Other exceptions Cervical / Trunk Exceptions: s/p lumbar surgery   Communication Communication Communication: No difficulties   Cognition Arousal/Alertness: Awake/alert Behavior During Therapy: WFL for tasks assessed/performed Overall Cognitive Status: Within Functional Limits for tasks assessed                                     General Comments  Clean, dry dressing at incision.    Exercises     Shoulder Instructions      Home Living Family/patient expects to be discharged to:: Private residence Living Arrangements: Alone Available Help at Discharge: Family;Available 24 hours/day Type  of Home: House Home Access: Stairs to enter [employer] of Steps: 4 Entrance Stairs-Rails: Left;Right Home Layout: One level     Bathroom Shower/Tub: Chief Strategy Officer: Standard     Home Equipment: Grab bars - tub/shower;Other (comment);Hand held shower head;Shower seat Lexicographer)   Additional Comments: Reports daughter plans on staying with her      Prior Functioning/Environment Level of Independence: Independent        Comments: Independent with ADLs/IADLs without AD. Patient was driving.        OT Problem List:        OT Treatment/Interventions:      OT  Goals(Current goals can be found in the care plan section) Acute Rehab OT Goals Patient Stated Goal: To return home. OT Goal Formulation: With patient  OT Frequency:     Barriers to D/C:            Co-evaluation              AM-PAC OT "6 Clicks" Daily Activity     Outcome Measure Help from another person eating meals?: None Help from another person taking care of personal grooming?: None Help from another person toileting, which includes using toliet, bedpan, or urinal?: None Help from another person bathing (including washing, rinsing, drying)?: None Help from another person to put on and taking off regular upper body clothing?: None Help from another person to put on and taking off regular lower body clothing?: None 6 Click Score: 24   End of Session Equipment Utilized During Treatment: Gait belt  Activity Tolerance: Patient tolerated treatment well Patient left: in chair;with call bell/phone within reach  OT Visit Diagnosis: Pain                Time: 0076-2263 OT Time Calculation (min): 19 min Charges:  OT General Charges $OT Visit: 1 Visit OT Evaluation $OT Eval Low Complexity: 1 Low  [name] H. OTR/L Supplemental OT, Department of rehab services [phone]  [name] R H. 04/25/2020, 7:43 AM

## 2020-07-10 ENCOUNTER — Encounter (INDEPENDENT_AMBULATORY_CARE_PROVIDER_SITE_OTHER): Payer: Self-pay | Admitting: Otolaryngology

## 2020-07-10 ENCOUNTER — Ambulatory Visit (INDEPENDENT_AMBULATORY_CARE_PROVIDER_SITE_OTHER): Payer: PPO | Admitting: Otolaryngology

## 2020-07-10 ENCOUNTER — Other Ambulatory Visit: Payer: Self-pay

## 2020-07-10 VITALS — Temp 97.2°F

## 2020-07-10 DIAGNOSIS — H903 Sensorineural hearing loss, bilateral: Secondary | ICD-10-CM | POA: Diagnosis not present

## 2020-07-10 DIAGNOSIS — H9313 Tinnitus, bilateral: Secondary | ICD-10-CM | POA: Diagnosis not present

## 2020-07-10 DIAGNOSIS — J310 Chronic rhinitis: Secondary | ICD-10-CM

## 2020-07-10 NOTE — Progress Notes (Signed)
HPI: [name] is a 80 y.o. female who presents is referred by [facility] for evaluation of tinnitus and hearing loss.  Patient recently got hearing aids as she has moderate hearing loss in both ears but she has also complained of chronic tinnitus in her ears which is worse on the right side.  I reviewed the audiogram performed 03/27/2020 she had a moderate bilateral sensorineural hearing loss slightly worse on the right side compared to the left with an additional small conductive component of the right side.  She brings with her photos of the tympanic membrane dated 06/23/2020 with questionable tear or abnormality of the right TM.  She brings the photos with her to the office today as well as the audiogram.. Patient does have history of allergies which are acting up over the past couple weeks with nasal congestion and runny nose.  She inquires about medications for allergies.  Past Medical History:  Diagnosis Date  . Diabetes mellitus without complication (HCC)    hx of 2006 no longer on meds, lost weight 40lbs, no longer a diabetic per patient,  . Hearing loss    wears hearing aids  . Hypertension   . Hypothyroidism   . Thyroid disease   . Wears dentures    Past Surgical History:  Procedure Laterality Date  . ANTERIOR AND POSTERIOR REPAIR WITH SACROSPINOUS FIXATION N/A 08/17/2016   Procedure: ANTERIOR AND POSTERIOR REPAIR;  Surgeon: Harold Hedge, MD;  Location: [facility];  Service: Gynecology;  Laterality: N/A;  . APPENDECTOMY    . bilateral cataract surgery     . CHOLECYSTECTOMY    . HAND SURGERY Right    Gramig  . HAND SURGERY Left    dr Amanda Pea  . LAPAROSCOPIC VAGINAL HYSTERECTOMY WITH SALPINGO OOPHORECTOMY Bilateral 08/17/2016   Procedure: LAPAROSCOPIC ASSISTED VAGINAL HYSTERECTOMY WITH SALPINGO OOPHORECTOMY;  Surgeon: Harold Hedge, MD;  Location: [facility];  Service: Gynecology;  Laterality: Bilateral;  . LUMBAR LAMINECTOMY/DECOMPRESSION MICRODISCECTOMY N/A 04/24/2020    Procedure: Microlumbar decompression Lumbar Four-Five Lumbar Three-Four lateral mass fusion with autologous/allograft bone;  Surgeon: Jene Every, MD;  Location: [facility] OR;  Service: Orthopedics;  Laterality: N/A;  posterior  . REVERSE SHOULDER ARTHROPLASTY Right 09/03/2015   Procedure: RIGHT REVERSE SHOULDER ARTHROPLASTY;  Surgeon: Beverely Low, MD;  Location: [facility];  Service: Orthopedics;  Laterality: Right;  . THYROIDECTOMY    . TONSILLECTOMY    . TOTAL HIP ARTHROPLASTY     bilateral  . TOTAL KNEE ARTHROPLASTY Left 03/28/2015   Procedure: LEFT TOTAL KNEE ARTHROPLASTY;  Surgeon: Eugenia Mcalpine, MD;  Location: [facility];  Service: Orthopedics;  Laterality: Left;  . TUBAL LIGATION    . URETHRAL DILATION     Social History   Socioeconomic History  . Marital status: Married    Spouse name: Not on file  . Number of children: Not on file  . Years of education: Not on file  . Highest education level: Not on file  Occupational History  . Not on file  Tobacco Use  . Smoking status: Never Smoker  . Smokeless tobacco: Never Used  Vaping Use  . Vaping Use: Never used  Substance and Sexual Activity  . Alcohol use: No  . Drug use: No  . Sexual activity: Not Currently    Birth control/protection: Surgical    Comment: Hysterectomy  Other Topics Concern  . Not on file  Social History Narrative  . Not on file   Social Determinants of Health   Financial Resource Strain: Not  on file  Food Insecurity: Not on file  Transportation Needs: Not on file  Physical Activity: Not on file  Stress: Not on file  Social Connections: Not on file   Family History  Problem Relation Age of Onset  . CAD Neg Hx   . Diabetes Mellitus II Neg Hx    Allergies  Allergen Reactions  . Penicillins Swelling    UNSPECIFIED SWELLING. Has patient had reaction causing immediate rash, facial/tongue/throat swelling, SOB or lightheadedness, hypotension: No Reaction causing severe rash involving mucus membranes or skin  necrosis: No Has patient had a PCN reaction that required hospitalization No Has patient had a PCN reaction occurring within the last 10 years: No If all of the above answers are "NO", then may proceed with Cephalosporin use.   Marland Kitchen Oxycodone Other (See Comments)    Patient Preference  Makes her feel "weird" and she prefers not to take it   Prior to Admission medications   Medication Sig Start Date End Date Taking? Authorizing Provider  acetaminophen (TYLENOL) 500 MG tablet Take 500 mg by mouth every 8 (eight) hours as needed for moderate pain.    [provider]  docusate sodium (COLACE) 100 MG capsule Take 1 capsule (100 mg total) by mouth 2 (two) times daily as needed for mild constipation. 04/24/20   Jene Every, MD  levothyroxine (SYNTHROID, LEVOTHROID) 100 MCG tablet Take 100 mcg by mouth daily before breakfast.  09/04/13   [provider]  lisinopril (PRINIVIL,ZESTRIL) 10 MG tablet Take 10 mg by mouth daily.    [provider]  Misc Natural Products (IMMUNE FORMULA PO) Take 1 tablet by mouth daily.    [provider]  polyethylene glycol (MIRALAX / GLYCOLAX) 17 g packet Take 17 g by mouth daily. 04/24/20   Jene Every, MD     Positive ROS: Otherwise negative  All other systems have been reviewed and were otherwise negative with the exception of those mentioned in the HPI and as above.  Physical Exam: Constitutional: Alert, well-appearing, no acute distress Ears: External ears without lesions or tenderness.  On microscopic exam in the office today the ear canals are clear bilaterally with a minimal wax buildup.  The TMs are clear bilaterally with no TM abnormality noted.  Both TMs have good mobility on pneumatic otoscopy.  No middle ear effusion noted.  On tuning fork testing AC was greater than BC bilaterally.  Auscultation of the ears reveal no objective pulse or tinnitus. Nasal: External nose without lesions. Septum midline with mild rhinitis  and clear mucus discharge.  Both middle meatus regions are clear with no signs of infection. Oral: Lips and gums without lesions. Tongue and palate mucosa without lesions. Posterior oropharynx clear. Neck: No palpable adenopathy or masses.  Auscultation of the neck reveals no carotid bruits. Respiratory: Breathing comfortably  Skin: No facial/neck lesions or rash noted.  Procedures  Assessment: TMs are clear bilaterally with no significant TM abnormality noted. Bilateral sensorineural hearing loss worse of the right side.  Secondary tinnitus. Allergic rhinitis   Plan: Would recommend proceeding with hearing aids and adjustment accordingly.  There is no specific medical therapy to help with this outside of use of allergy medications which will help with eustachian tube dysfunction it I prescribed Nasacort for her to use for her allergies.   Narda Bonds, MD   CC:

## 2020-07-30 ENCOUNTER — Encounter (INDEPENDENT_AMBULATORY_CARE_PROVIDER_SITE_OTHER): Payer: Self-pay

## 2020-08-09 DIAGNOSIS — E039 Hypothyroidism, unspecified: Secondary | ICD-10-CM | POA: Diagnosis not present

## 2020-08-09 DIAGNOSIS — I10 Essential (primary) hypertension: Secondary | ICD-10-CM | POA: Diagnosis not present

## 2020-08-25 DIAGNOSIS — I10 Essential (primary) hypertension: Secondary | ICD-10-CM | POA: Diagnosis not present

## 2020-08-25 DIAGNOSIS — M25562 Pain in left knee: Secondary | ICD-10-CM | POA: Diagnosis not present

## 2020-08-25 DIAGNOSIS — Z6825 Body mass index (BMI) 25.0-25.9, adult: Secondary | ICD-10-CM | POA: Diagnosis not present

## 2020-08-25 DIAGNOSIS — R609 Edema, unspecified: Secondary | ICD-10-CM | POA: Diagnosis not present

## 2020-08-27 ENCOUNTER — Encounter (HOSPITAL_COMMUNITY): Payer: Self-pay

## 2020-08-27 ENCOUNTER — Emergency Department (HOSPITAL_COMMUNITY): Payer: PPO

## 2020-08-27 ENCOUNTER — Inpatient Hospital Stay (HOSPITAL_COMMUNITY): Payer: PPO

## 2020-08-27 ENCOUNTER — Inpatient Hospital Stay (HOSPITAL_COMMUNITY)
Admission: EM | Admit: 2020-08-27 | Discharge: 2020-08-29 | DRG: 536 | Disposition: A | Discharge status: Home / Self Care | Payer: PPO | Attending: Family Medicine | Admitting: Family Medicine

## 2020-08-27 DIAGNOSIS — I951 Orthostatic hypotension: Secondary | ICD-10-CM | POA: Diagnosis not present

## 2020-08-27 DIAGNOSIS — Z79899 Other long term (current) drug therapy: Secondary | ICD-10-CM | POA: Diagnosis not present

## 2020-08-27 DIAGNOSIS — Y92481 Parking lot as the place of occurrence of the external cause: Secondary | ICD-10-CM

## 2020-08-27 DIAGNOSIS — Z20822 Contact with and (suspected) exposure to covid-19: Secondary | ICD-10-CM | POA: Diagnosis present

## 2020-08-27 DIAGNOSIS — W19XXXA Unspecified fall, initial encounter: Secondary | ICD-10-CM | POA: Diagnosis not present

## 2020-08-27 DIAGNOSIS — R001 Bradycardia, unspecified: Secondary | ICD-10-CM | POA: Diagnosis present

## 2020-08-27 DIAGNOSIS — E119 Type 2 diabetes mellitus without complications: Secondary | ICD-10-CM | POA: Diagnosis not present

## 2020-08-27 DIAGNOSIS — S72112A Displaced fracture of greater trochanter of left femur, initial encounter for closed fracture: Secondary | ICD-10-CM | POA: Diagnosis not present

## 2020-08-27 DIAGNOSIS — Z96652 Presence of left artificial knee joint: Secondary | ICD-10-CM | POA: Diagnosis present

## 2020-08-27 DIAGNOSIS — Z90710 Acquired absence of both cervix and uterus: Secondary | ICD-10-CM

## 2020-08-27 DIAGNOSIS — S80212A Abrasion, left knee, initial encounter: Secondary | ICD-10-CM | POA: Diagnosis present

## 2020-08-27 DIAGNOSIS — M9702XA Periprosthetic fracture around internal prosthetic left hip joint, initial encounter: Secondary | ICD-10-CM | POA: Diagnosis not present

## 2020-08-27 DIAGNOSIS — Z01818 Encounter for other preprocedural examination: Secondary | ICD-10-CM

## 2020-08-27 DIAGNOSIS — I10 Essential (primary) hypertension: Secondary | ICD-10-CM | POA: Diagnosis present

## 2020-08-27 DIAGNOSIS — S72002A Fracture of unspecified part of neck of left femur, initial encounter for closed fracture: Secondary | ICD-10-CM

## 2020-08-27 DIAGNOSIS — E890 Postprocedural hypothyroidism: Secondary | ICD-10-CM | POA: Diagnosis present

## 2020-08-27 DIAGNOSIS — Z79890 Hormone replacement therapy (postmenopausal): Secondary | ICD-10-CM

## 2020-08-27 DIAGNOSIS — W010XXA Fall on same level from slipping, tripping and stumbling without subsequent striking against object, initial encounter: Secondary | ICD-10-CM | POA: Diagnosis present

## 2020-08-27 DIAGNOSIS — Z96611 Presence of right artificial shoulder joint: Secondary | ICD-10-CM | POA: Diagnosis not present

## 2020-08-27 DIAGNOSIS — M25512 Pain in left shoulder: Secondary | ICD-10-CM | POA: Diagnosis not present

## 2020-08-27 DIAGNOSIS — S72115A Nondisplaced fracture of greater trochanter of left femur, initial encounter for closed fracture: Secondary | ICD-10-CM | POA: Diagnosis not present

## 2020-08-27 DIAGNOSIS — M533 Sacrococcygeal disorders, not elsewhere classified: Secondary | ICD-10-CM | POA: Diagnosis not present

## 2020-08-27 DIAGNOSIS — Z96642 Presence of left artificial hip joint: Secondary | ICD-10-CM | POA: Diagnosis not present

## 2020-08-27 DIAGNOSIS — M25552 Pain in left hip: Secondary | ICD-10-CM | POA: Diagnosis not present

## 2020-08-27 LAB — CBC
HCT: 38.8 % (ref 36.0–46.0)
HCT: 35.9 % — ABNORMAL LOW (ref 36.0–46.0)
Hemoglobin: 13.1 g/dL (ref 12.0–15.0)
Hemoglobin: 12 g/dL (ref 12.0–15.0)
MCH: 29.2 pg (ref 26.0–34.0)
MCH: 28.9 pg (ref 26.0–34.0)
MCHC: 33.8 g/dL (ref 30.0–36.0)
MCHC: 33.4 g/dL (ref 30.0–36.0)
MCV: 86.6 fL (ref 80.0–100.0)
MCV: 86.5 fL (ref 80.0–100.0)
Platelets: 175 10*3/uL (ref 150–400)
Platelets: 160 10*3/uL (ref 150–400)
RBC: 4.48 MIL/uL (ref 3.87–5.11)
RBC: 4.15 MIL/uL (ref 3.87–5.11)
RDW: 13.4 % (ref 11.5–15.5)
RDW: 13.3 % (ref 11.5–15.5)
WBC: 8.1 10*3/uL (ref 4.0–10.5)
WBC: 6.7 10*3/uL (ref 4.0–10.5)
nRBC: 0 % (ref 0.0–0.2)
nRBC: 0 % (ref 0.0–0.2)

## 2020-08-27 LAB — CREATININE, SERUM
Creatinine, Ser: 0.65 mg/dL (ref 0.44–1.00)
GFR, Estimated: >60 mL/min (ref >60)

## 2020-08-27 LAB — BASIC METABOLIC PANEL
Anion gap: 11 (ref 5–15)
BUN: 20 mg/dL (ref 8–23)
CO2: 27 mmol/L (ref 22–32)
Calcium: 9.8 mg/dL (ref 8.9–10.3)
Chloride: 97 mmol/L — ABNORMAL LOW (ref 98–111)
Creatinine, Ser: 0.75 mg/dL (ref 0.44–1.00)
GFR, Estimated: >60 mL/min (ref >60)
Glucose, Bld: 98 mg/dL (ref 70–99)
Potassium: 4 mmol/L (ref 3.5–5.1)
Sodium: 135 mmol/L (ref 135–145)

## 2020-08-27 LAB — RESP PANEL BY RT-PCR (FLU A&B, COVID) ARPGX2
Influenza A by PCR: NEGATIVE
Influenza B by PCR: NEGATIVE
SARS Coronavirus 2 by RT PCR: NEGATIVE

## 2020-08-27 LAB — PROTIME-INR
INR: 1 (ref 0.8–1.2)
Prothrombin Time: 13.3 seconds (ref 11.4–15.2)

## 2020-08-27 MED ORDER — LEVOTHYROXINE SODIUM 100 MCG PO TABS
100.0000 ug | ORAL_TABLET | Freq: Every day | ORAL | Status: DC
  Administered 2020-08-28 – 2020-08-29 (×2): 100 ug via ORAL
  Filled 2020-08-27 (×2): qty 1

## 2020-08-27 MED ORDER — DOCUSATE SODIUM 100 MG PO CAPS: 100.0000 mg | ORAL_CAPSULE | Freq: Two times a day (BID) | ORAL | Status: DC | PRN

## 2020-08-27 MED ORDER — LISINOPRIL 10 MG PO TABS
10.0000 mg | ORAL_TABLET | Freq: Every day | ORAL | Status: DC
Start: 2020-08-27 — End: 2020-08-28
  Filled 2020-08-27: qty 1

## 2020-08-27 MED ORDER — HEPARIN SODIUM (PORCINE) 5000 UNIT/ML IJ SOLN
5000.0000 [IU] | Freq: Three times a day (TID) | INTRAMUSCULAR | Status: DC
  Administered 2020-08-28: 5000 [IU] via SUBCUTANEOUS
  Filled 2020-08-27 (×2): qty 1

## 2020-08-27 MED ORDER — INSULIN ASPART 100 UNIT/ML IJ SOLN
0.0000 [IU] | INTRAMUSCULAR | Status: DC
  Administered 2020-08-28: 2 [IU] via SUBCUTANEOUS

## 2020-08-27 NOTE — ED Provider Notes (Signed)
[facility]-EMERGENCY DEPT Provider Note   CSN: [id number] Arrival date & time: 08/27/20  1756     History Chief Complaint  Patient presents with  . Hip Pain    [name] is a 80 y.o. female.  80 year old female with past medical history below including hypertension, hypothyroidism, type 2 diabetes mellitus who presents with left hip injury.  Patient has a history of left knee replacement and was going to [facility] to see Dr. Thomasena Edis about her knee today.  And walking into the building, she tripped over a curb and fell onto her left side.  She did not hit her head or lose consciousness but injured her left hip.  She reports pain in her left hip as well as her left arm and along her left side.  She went into the clinic and had imaging of her arm, hip, and knee which were notable for a periprosthetic fracture of her left hip.  She was sent here for further care.  She reports normal sensation in her feet.  She denies any anticoagulant use.  The history is provided by the patient.  Hip Pain       Past Medical History:  Diagnosis Date  . Diabetes mellitus without complication (HCC)    hx of 2006 no longer on meds, lost weight 40lbs, no longer a diabetic per patient,  . Hearing loss    wears hearing aids  . Hypertension   . Hypothyroidism   . Thyroid disease   . Wears dentures     Patient Active Problem List   Diagnosis Date Noted  . Spinal stenosis at L4-L5 level 04/24/2020  . Pelvic prolapse 08/17/2016  . S/P shoulder replacement 09/03/2015  . S/P total knee replacement 03/28/2015  . Hypokalemia 10/02/2013  . Diarrhea 10/02/2013  . Diabetes mellitus (HCC) 10/02/2013  . HTN (hypertension) 10/02/2013  . Hypothyroidism 10/02/2013    Past Surgical History:  Procedure Laterality Date  . ANTERIOR AND POSTERIOR REPAIR WITH SACROSPINOUS FIXATION N/A 08/17/2016   Procedure: ANTERIOR AND POSTERIOR REPAIR;  Surgeon: Harold Hedge, MD;  Location: [facility];  Service: Gynecology;  Laterality: N/A;  . APPENDECTOMY    . bilateral cataract surgery     . CHOLECYSTECTOMY    . HAND SURGERY Right    Gramig  . HAND SURGERY Left    dr Amanda Pea  . LAPAROSCOPIC VAGINAL HYSTERECTOMY WITH SALPINGO OOPHORECTOMY Bilateral 08/17/2016   Procedure: LAPAROSCOPIC ASSISTED VAGINAL HYSTERECTOMY WITH SALPINGO OOPHORECTOMY;  Surgeon: Harold Hedge, MD;  Location: [facility];  Service: Gynecology;  Laterality: Bilateral;  . LUMBAR LAMINECTOMY/DECOMPRESSION MICRODISCECTOMY N/A 04/24/2020   Procedure: Microlumbar decompression Lumbar Four-Five Lumbar Three-Four lateral mass fusion with autologous/allograft bone;  Surgeon: Jene Every, MD;  Location: [facility] OR;  Service: Orthopedics;  Laterality: N/A;  posterior  . REVERSE SHOULDER ARTHROPLASTY Right 09/03/2015   Procedure: RIGHT REVERSE SHOULDER ARTHROPLASTY;  Surgeon: Beverely Low, MD;  Location: [facility] OR;  Service: Orthopedics;  Laterality: Right;  . THYROIDECTOMY    . TONSILLECTOMY    . TOTAL HIP ARTHROPLASTY     bilateral  . TOTAL KNEE ARTHROPLASTY Left 03/28/2015   Procedure: LEFT TOTAL KNEE ARTHROPLASTY;  Surgeon: Eugenia Mcalpine, MD;  Location: [facility] ORS;  Service: Orthopedics;  Laterality: Left;  . TUBAL LIGATION    . URETHRAL DILATION       OB History   No obstetric history on file.     Family History  Problem Relation Age of Onset  . CAD Neg Hx   .  Diabetes Mellitus II Neg Hx     Social History   Tobacco Use  . Smoking status: Never Smoker  . Smokeless tobacco: Never Used  Vaping Use  . Vaping Use: Never used  Substance Use Topics  . Alcohol use: No  . Drug use: No    Home Medications Prior to Admission medications   Medication Sig Start Date End Date Taking? Authorizing Provider  acetaminophen (TYLENOL) 500 MG tablet Take 500 mg by mouth every 8 (eight) hours as needed for moderate pain.    [provider]  docusate sodium (COLACE) 100 MG capsule Take 1 capsule (100 mg total) by mouth 2  (two) times daily as needed for mild constipation. 04/24/20   Jene Every, MD  levothyroxine (SYNTHROID, LEVOTHROID) 100 MCG tablet Take 100 mcg by mouth daily before breakfast.  09/04/13   [provider]  lisinopril (PRINIVIL,ZESTRIL) 10 MG tablet Take 10 mg by mouth daily.    [provider]  Misc Natural Products (IMMUNE FORMULA PO) Take 1 tablet by mouth daily.    [provider]  polyethylene glycol (MIRALAX / GLYCOLAX) 17 g packet Take 17 g by mouth daily. 04/24/20   Jene Every, MD    Allergies    Penicillins and Oxycodone  Review of Systems   Review of Systems All other systems reviewed and are negative except that which was mentioned in HPI  Physical Exam Updated Vital Signs BP (!) 162/67   Pulse (!) 51   Temp 98.1 F (36.7 C) (Oral)   Resp 18   Ht 5\' 4"  (1.626 m)   Wt 62.1 kg   LMP  (LMP Unknown)   SpO2 100%   BMI 23.52 kg/m   Physical Exam Constitutional:      General: She is not in acute distress.    Appearance: Normal appearance.  HENT:     Head: Normocephalic and atraumatic.  Eyes:     Conjunctiva/sclera: Conjunctivae normal.  Cardiovascular:     Rate and Rhythm: Normal rate and regular rhythm.     Heart sounds: Normal heart sounds. No murmur heard.   Pulmonary:     Effort: Pulmonary effort is normal.     Breath sounds: Normal breath sounds.  Abdominal:     General: Abdomen is flat. Bowel sounds are normal. There is no distension.     Palpations: Abdomen is soft.     Tenderness: There is no abdominal tenderness.  Musculoskeletal:     Right lower leg: No edema.     Left lower leg: No edema.     Comments: L leg slightly externally rotated, 2+ DP pulses, tenderness L hip; small abrasion on L knee; normal ROM L shoulder and elbow  Skin:    General: Skin is warm and dry.  Neurological:     Mental Status: She is alert and oriented to person, place, and time.     Sensory: No sensory deficit.     Comments: fluent   Psychiatric:        Mood and Affect: Mood normal.        Behavior: Behavior normal.     ED Results / Procedures / Treatments   Labs (all labs ordered are listed, but only abnormal results are displayed) Labs Reviewed  BASIC METABOLIC PANEL - Abnormal; Notable for the following components:      Result Value   Chloride 97 (*)    All other components within normal limits  RESP PANEL BY RT-PCR (FLU A&B, COVID) ARPGX2  CBC    EKG None  Radiology No results found.  Procedures Procedures   Medications Ordered in ED Medications - No data to display  ED Course  I have reviewed the triage vital signs and the nursing notes.  Pertinent labs & imaging results that were available during my care of the patient were reviewed by me and considered in my medical decision making (see chart for details).    MDM Rules/Calculators/A&P                          Plain films obtained at Ortho clinic prior to arrival.  Patient neurovascularly intact distally.  Discussed with Dr. Linna Caprice who has ordered CT and will follow along for management plan. Discussed admission with [facility], Dr. Emmit Pomfret.  Final Clinical Impression(s) / ED Diagnoses Final diagnoses:  None    Rx / DC Orders ED Discharge Orders    None       Jaki Hammerschmidt, Ambrose Finland, MD 08/27/20 2018

## 2020-08-27 NOTE — ED Notes (Signed)
Coming from [facility]-Dr Swintech sending patient for possible admission for a periprosthetic fracture

## 2020-08-27 NOTE — Progress Notes (Signed)
Imaging reviewed. Patient has L [place] AG ppx femur fracture. Implant is stable, and therefore treated nonoperatively. WBAT with walker. No active abduction for 6 weeks.

## 2020-08-27 NOTE — H&P (Signed)
History and Physical   [facility] [place] [facility] Admission History and Physical [employer], D.O.    Patient Name: [name] MR#: [id number] Date of Birth: 26-Feb-1941 Date of Admission: 08/27/2020  Referring MD/NP/PA: Dr. Clarene Duke Primary Care Physician: Lonie Peak, PA-C  Chief Complaint:  Chief Complaint  Patient presents with  . Hip Pain    HPI: [name] is a 80 y.o. female with a known history of hypertension, type 2 diabetes, hypothyroidism presents to the emergency department for evaluation of left hip pain status post fall.  Patient was in a usual state of health until today when she was visiting orthopedics for a follow-up regarding her left total knee replacement, she she sustained a mechanical fall landing on her left side.  She did not have any loss of consciousness, states that she tripped over a curb.  Brought into the orthopedist office where she underwent some imaging which revealed possible periprosthetic fracture of her left hip and was therefore referred to the emergency department for additional imaging.  Patient denies fevers/chills, weakness, dizziness, chest pain, shortness of breath, N/V/C/D, abdominal pain, dysuria/frequency, changes in mental status.    Otherwise there has been no change in status. Patient has been taking medication as prescribed and there has been no recent change in medication or diet.  No recent antibiotics.  There has been no recent illness, hospitalizations, travel or sick contacts.    EMS/ED Course:Medical admission has been requested for further management of left periprosthetic hip fracture.  Review of Systems:  CONSTITUTIONAL: No fever/chills, fatigue, weakness, weight gain/loss, headache. EYES: No blurry or double vision. ENT: No tinnitus, postnasal drip, redness or soreness of the oropharynx. RESPIRATORY: No cough, dyspnea, wheeze.  No hemoptysis.  CARDIOVASCULAR: No chest pain,  palpitations, syncope, orthopnea. No lower extremity edema.  GASTROINTESTINAL: No nausea, vomiting, abdominal pain, diarrhea, constipation.  No hematemesis, melena or hematochezia. GENITOURINARY: No dysuria, frequency, hematuria. ENDOCRINE: No polyuria or nocturia. No heat or cold intolerance. HEMATOLOGY: No anemia, bruising, bleeding. INTEGUMENTARY: No rashes, ulcers, lesions. MUSCULOSKELETAL: Left hip and arm pain no arthritis, gout, dyspnea. NEUROLOGIC: No numbness, tingling, ataxia, seizure-type activity, weakness. PSYCHIATRIC: No anxiety, depression, insomnia.   Past Medical History:  Diagnosis Date  . Diabetes mellitus without complication (HCC)    hx of 2006 no longer on meds, lost weight 40lbs, no longer a diabetic per patient,  . Hearing loss    wears hearing aids  . Hypertension   . Hypothyroidism   . Thyroid disease   . Wears dentures     Past Surgical History:  Procedure Laterality Date  . ANTERIOR AND POSTERIOR REPAIR WITH SACROSPINOUS FIXATION N/A 08/17/2016   Procedure: ANTERIOR AND POSTERIOR REPAIR;  Surgeon: Harold Hedge, MD;  Location: [facility];  Service: Gynecology;  Laterality: N/A;  . APPENDECTOMY    . bilateral cataract surgery     . CHOLECYSTECTOMY    . HAND SURGERY Right    Gramig  . HAND SURGERY Left    dr Amanda Pea  . LAPAROSCOPIC VAGINAL HYSTERECTOMY WITH SALPINGO OOPHORECTOMY Bilateral 08/17/2016   Procedure: LAPAROSCOPIC ASSISTED VAGINAL HYSTERECTOMY WITH SALPINGO OOPHORECTOMY;  Surgeon: Harold Hedge, MD;  Location: [facility];  Service: Gynecology;  Laterality: Bilateral;  . LUMBAR LAMINECTOMY/DECOMPRESSION MICRODISCECTOMY N/A 04/24/2020   Procedure: Microlumbar decompression Lumbar Four-Five Lumbar Three-Four lateral mass fusion with autologous/allograft bone;  Surgeon: Jene Every, MD;  Location: [facility] OR;  Service: Orthopedics;  Laterality: N/A;  posterior  . REVERSE SHOULDER ARTHROPLASTY Right 09/03/2015  Procedure: RIGHT REVERSE SHOULDER ARTHROPLASTY;   Surgeon: Beverely Low, MD;  Location[facility];  Service: Orthopedics;  Laterality: Right;  . THYROIDECTOMY    . TONSILLECTOMY    . TOTAL HIP ARTHROPLASTY     bilateral  . TOTAL KNEE ARTHROPLASTY Left 03/28/2015   Procedure: LEFT TOTAL KNEE ARTHROPLASTY;  Surgeon: Eugenia Mcalpine, MD;  Location: [facility];  Service: Orthopedics;  Laterality: Left;  . TUBAL LIGATION    . URETHRAL DILATION       reports that she has never smoked. She has never used smokeless tobacco. She reports that she does not drink alcohol and does not use drugs.  Allergies  Allergen Reactions  . Penicillins Swelling    UNSPECIFIED SWELLING. Has patient had reaction causing immediate rash, facial/tongue/throat swelling, SOB or lightheadedness, hypotension: No Reaction causing severe rash involving mucus membranes or skin necrosis: No Has patient had a PCN reaction that required hospitalization No Has patient had a PCN reaction occurring within the last 10 years: No If all of the above answers are "NO", then may proceed with Cephalosporin use.   Marland Kitchen Oxycodone Other (See Comments)    Patient Preference  Makes her feel "weird" and she prefers not to take it    Family History  Problem Relation Age of Onset  . CAD Neg Hx   . Diabetes Mellitus II Neg Hx     Prior to Admission medications   Medication Sig Start Date End Date Taking? Authorizing Provider  acetaminophen (TYLENOL) 500 MG tablet Take 500 mg by mouth every 8 (eight) hours as needed for moderate pain.    [provider]  docusate sodium (COLACE) 100 MG capsule Take 1 capsule (100 mg total) by mouth 2 (two) times daily as needed for mild constipation. 04/24/20   Jene Every, MD  levothyroxine (SYNTHROID, LEVOTHROID) 100 MCG tablet Take 100 mcg by mouth daily before breakfast.  09/04/13   [provider]  lisinopril (PRINIVIL,ZESTRIL) 10 MG tablet Take 10 mg by mouth daily.    [provider]  Misc Natural Products (IMMUNE FORMULA PO)  Take 1 tablet by mouth daily.    [provider]  polyethylene glycol (MIRALAX / GLYCOLAX) 17 g packet Take 17 g by mouth daily. 04/24/20   Jene Every, MD    Physical Exam: Vitals:   08/27/20 1900 08/27/20 1915 08/27/20 1930 08/27/20 1940  BP:    (!) 162/67  Pulse: (!) 53 (!) 58 (!) 25 (!) 51  Resp: 16 15 20 18   Temp:      TempSrc:      SpO2: 100% 99% (!) 74% 100%  Weight:      Height:        GENERAL: 80 y.o.-year-old female patient, well-developed, well-nourished lying in the bed in no acute distress.  Pleasant and cooperative.   HEENT: Head atraumatic, normocephalic. Pupils equal. Mucus membranes moist. NECK: Supple. No JVD. CHEST: Normal breath sounds bilaterally. No wheezing, rales, rhonchi or crackles. No use of accessory muscles of respiration.  No reproducible chest wall tenderness.  CARDIOVASCULAR: S1, S2 normal. No murmurs, rubs, or gallops. Cap refill <2 seconds. Pulses intact distally.  ABDOMEN: Soft, nondistended, nontender. No rebound, guarding, rigidity. Normoactive bowel sounds present in all four quadrants.  EXTREMITIES: Shortened and externally rotated.  Tenderness to palpation over the left hip.  No pedal edema, cyanosis, or clubbing. No calf tenderness or Homan's sign.  NEUROLOGIC: The patient is alert and oriented x 3. Cranial nerves II through XII are grossly  intact with no focal sensorimotor deficit. PSYCHIATRIC:  Normal affect, mood, thought content. SKIN: Warm, dry, and intact without obvious rash, lesion, or ulcer.    Labs on Admission:  CBC: Recent Labs  Lab 08/27/20 1856  WBC 8.1  HGB 13.1  HCT 38.8  MCV 86.6  PLT 175   Basic Metabolic Panel: Recent Labs  Lab 08/27/20 1856  NA 135  K 4.0  CL 97*  CO2 27  GLUCOSE 98  BUN 20  CREATININE 0.75  CALCIUM 9.8   GFR: Estimated Creatinine Clearance: 47.6 mL/min (by C-G formula based on SCr of 0.75 mg/dL). Liver Function Tests: No results for input(s): AST, ALT, ALKPHOS, BILITOT,  PROT, ALBUMIN in the last 168 hours. No results for input(s): LIPASE, AMYLASE in the last 168 hours. No results for input(s): AMMONIA in the last 168 hours. Coagulation Profile: No results for input(s): INR, PROTIME in the last 168 hours. Cardiac Enzymes: No results for input(s): CKTOTAL, CKMB, CKMBINDEX, TROPONINI in the last 168 hours. BNP (last 3 results) No results for input(s): PROBNP in the last 8760 hours. HbA1C: No results for input(s): HGBA1C in the last 72 hours. CBG: No results for input(s): GLUCAP in the last 168 hours. Lipid Profile: No results for input(s): CHOL, HDL, LDLCALC, TRIG, CHOLHDL, LDLDIRECT in the last 72 hours. Thyroid Function Tests: No results for input(s): TSH, T4TOTAL, FREET4, T3FREE, THYROIDAB in the last 72 hours. Anemia Panel: No results for input(s): VITAMINB12, FOLATE, FERRITIN, TIBC, IRON, RETICCTPCT in the last 72 hours. Urine analysis:    Component Value Date/Time   COLORURINE YELLOW 02/13/2015 0800   APPEARANCEUR CLEAR 02/13/2015 0800   LABSPEC 1.006 02/13/2015 0800   PHURINE 5.5 02/13/2015 0800   GLUCOSEU NEGATIVE 02/13/2015 0800   HGBUR NEGATIVE 02/13/2015 0800   BILIRUBINUR NEGATIVE 02/13/2015 0800   KETONESUR NEGATIVE 02/13/2015 0800   PROTEINUR NEGATIVE 02/13/2015 0800   UROBILINOGEN 0.2 02/13/2015 0800   NITRITE NEGATIVE 02/13/2015 0800   LEUKOCYTESUR NEGATIVE 02/13/2015 0800   Sepsis Labs: @LABRCNTIP (procalcitonin:4,lacticidven:4) ) Recent Results (from the past 240 hour(s))  Resp Panel by RT-PCR (Flu A&B, Covid) Nasopharyngeal Swab     Status: None   Collection Time: 08/27/20  6:57 PM   Specimen: Nasopharyngeal Swab; Nasopharyngeal(NP) swabs in vial transport medium  Result Value Ref Range Status   SARS Coronavirus 2 by RT PCR NEGATIVE NEGATIVE Final    Comment: (NOTE) SARS-CoV-2 target nucleic acids are NOT DETECTED.  The SARS-CoV-2 RNA is generally detectable in upper respiratory specimens during the acute phase of  infection. The lowest concentration of SARS-CoV-2 viral copies this assay can detect is 138 copies/mL. A negative result does not preclude SARS-Cov-2 infection and should not be used as the sole basis for treatment or other patient management decisions. A negative result may occur with  improper specimen collection/handling, submission of specimen other than nasopharyngeal swab, presence of viral mutation(s) within the areas targeted by this assay, and inadequate number of viral copies(<138 copies/mL). A negative result must be combined with clinical observations, patient history, and epidemiological information. The expected result is Negative.  Fact Sheet for Patients:  BloggerCourse.comhttps://www.fda.gov/media/152166/download  Fact Sheet for Healthcare Providers:  SeriousBroker.ithttps://www.fda.gov/media/152162/download  This test is no t yet approved or cleared by the [place] FDA and  has been authorized for detection and/or diagnosis of SARS-CoV-2 by FDA under an Emergency Use Authorization (EUA). This EUA will remain  in effect (meaning this test can be used) for the duration of the COVID-19 declaration under Section 564(b)(1) of  the Act, 21 U.S.C.section 360bbb-3(b)(1), unless the authorization is terminated  or revoked sooner.       Influenza A by PCR NEGATIVE NEGATIVE Final   Influenza B by PCR NEGATIVE NEGATIVE Final    Comment: (NOTE) The Xpert Xpress SARS-CoV-2/FLU/RSV plus assay is intended as an aid in the diagnosis of influenza from Nasopharyngeal swab specimens and should not be used as a sole basis for treatment. Nasal washings and aspirates are unacceptable for Xpert Xpress SARS-CoV-2/FLU/RSV testing.  Fact Sheet for Patients: BloggerCourse.com  Fact Sheet for Healthcare Providers: SeriousBroker.it  This test is not yet approved or cleared by the [place] FDA and has been authorized for detection and/or diagnosis of SARS-CoV-2  by FDA under an Emergency Use Authorization (EUA). This EUA will remain in effect (meaning this test can be used) for the duration of the COVID-19 declaration under Section 564(b)(1) of the Act, 21 U.S.C. section 360bbb-3(b)(1), unless the authorization is terminated or revoked.  Performed at [facility], [address], [place], [place] [address]      Radiological Exams on Admission: No results found.  EKG: Sinus bradycardia at 47 bpm with LVH with normal axis and nonspecific ST-T wave changes.   Assessment/Plan  This is a 80 y.o. female with a history of hypertension, type 2 diabetes, hypothyroidism now being admitted with:  #.  Left periprosthetic hip fracture - Admit inpatient - Orthopedics has been consulted by the emergency department physician and recommends nonsurgical management with weightbearing as tolerated with a walker and no active abduction for 6 weeks. - Obtain PT evaluation -Pain control  #.  H/o Diabetes - Accuchecks achs with RISS coverage - Heart healthy, carb controlled diet  #.  History of hypothyroidism - Continue Synthroid  #. History of hypertension - Continue lisinopril  Admission status: Inpatient IV Fluids: Hep-Lock Diet/Nutrition: Heart healthy, carb controlled Consults called: Dr. Linna Caprice of orthopedics was consulted by the emergency department physician DVT Px: Heparin, SCDs and early ambulation. Code Status: Full Code  Disposition Plan: To be determined  All the records are reviewed and case discussed with ED provider. Management plans discussed with the patient and/or family who express understanding and agree with plan of care.  Yvette Roark D.O. on 08/27/2020 at 8:16 PM CC: Primary care physician; Lonie Peak, PA-C   08/27/2020, 8:16 PM

## 2020-08-27 NOTE — ED Notes (Signed)
Daughter, Disree was updated that her mother would me moving to room 1333. Daughter states she will notify the family and call in the morning for an update.

## 2020-08-27 NOTE — ED Triage Notes (Signed)
Pt presents to the ED from [facility] for a possible left hip prosthesis fracture. Pt fell onto the left side at [facility] and xrays there showed a possible fracture of the left hip replacement. Pt denies LOC and blood thinner use.

## 2020-08-27 NOTE — ED Notes (Signed)
Pt provided with a warm blanket.  

## 2020-08-28 ENCOUNTER — Other Ambulatory Visit: Payer: Self-pay

## 2020-08-28 LAB — COMPREHENSIVE METABOLIC PANEL
ALT: 15 U/L (ref 0–44)
AST: 24 U/L (ref 15–41)
Albumin: 3.8 g/dL (ref 3.5–5.0)
Alkaline Phosphatase: 102 U/L (ref 38–126)
Anion gap: 8 (ref 5–15)
BUN: 19 mg/dL (ref 8–23)
CO2: 27 mmol/L (ref 22–32)
Calcium: 9.2 mg/dL (ref 8.9–10.3)
Chloride: 100 mmol/L (ref 98–111)
Creatinine, Ser: 0.76 mg/dL (ref 0.44–1.00)
GFR, Estimated: >60 mL/min (ref >60)
Glucose, Bld: 102 mg/dL — ABNORMAL HIGH (ref 70–99)
Potassium: 4 mmol/L (ref 3.5–5.1)
Sodium: 135 mmol/L (ref 135–145)
Total Bilirubin: 0.7 mg/dL (ref 0.3–1.2)
Total Protein: 6.8 g/dL (ref 6.5–8.1)

## 2020-08-28 LAB — CBC WITH DIFFERENTIAL/PLATELET
Abs Immature Granulocytes: 0.03 10*3/uL (ref 0.00–0.07)
Basophils Absolute: 0 10*3/uL (ref 0.0–0.1)
Basophils Relative: 0 %
Eosinophils Absolute: 0.1 10*3/uL (ref 0.0–0.5)
Eosinophils Relative: 2 %
HCT: 35.1 % — ABNORMAL LOW (ref 36.0–46.0)
Hemoglobin: 11.7 g/dL — ABNORMAL LOW (ref 12.0–15.0)
Immature Granulocytes: 1 %
Lymphocytes Relative: 20 %
Lymphs Abs: 1.2 10*3/uL (ref 0.7–4.0)
MCH: 28.8 pg (ref 26.0–34.0)
MCHC: 33.3 g/dL (ref 30.0–36.0)
MCV: 86.5 fL (ref 80.0–100.0)
Monocytes Absolute: 0.7 10*3/uL (ref 0.1–1.0)
Monocytes Relative: 11 %
Neutro Abs: 4 10*3/uL (ref 1.7–7.7)
Neutrophils Relative %: 66 %
Platelets: 163 10*3/uL (ref 150–400)
RBC: 4.06 MIL/uL (ref 3.87–5.11)
RDW: 13.3 % (ref 11.5–15.5)
WBC: 6.1 10*3/uL (ref 4.0–10.5)
nRBC: 0 % (ref 0.0–0.2)

## 2020-08-28 LAB — HEMOGLOBIN A1C
Hgb A1c MFr Bld: 5.8 % — ABNORMAL HIGH (ref 4.8–5.6)
Mean Plasma Glucose: 119.76 mg/dL

## 2020-08-28 LAB — GLUCOSE, CAPILLARY
Glucose-Capillary: 122 mg/dL — ABNORMAL HIGH (ref 70–99)
Glucose-Capillary: 113 mg/dL — ABNORMAL HIGH (ref 70–99)
Glucose-Capillary: 103 mg/dL — ABNORMAL HIGH (ref 70–99)
Glucose-Capillary: 93 mg/dL (ref 70–99)
Glucose-Capillary: 121 mg/dL — ABNORMAL HIGH (ref 70–99)
Glucose-Capillary: 138 mg/dL — ABNORMAL HIGH (ref 70–99)

## 2020-08-28 MED ORDER — OXYCODONE HCL 5 MG PO TABS
5.0000 mg | ORAL_TABLET | ORAL | Status: DC | PRN
  Filled 2020-08-28: qty 1

## 2020-08-28 MED ORDER — ACETAMINOPHEN 325 MG PO TABS: 650.0000 mg | ORAL_TABLET | Freq: Four times a day (QID) | ORAL | Status: DC | PRN

## 2020-08-28 MED ORDER — ACETAMINOPHEN 500 MG PO TABS
1000.0000 mg | ORAL_TABLET | Freq: Three times a day (TID) | ORAL | Status: DC
  Administered 2020-08-28 – 2020-08-29 (×4): 1000 mg via ORAL
  Filled 2020-08-28 (×4): qty 2

## 2020-08-28 MED ORDER — OXYCODONE HCL 5 MG PO TABS: 2.5000 mg | ORAL_TABLET | ORAL | Status: DC | PRN

## 2020-08-28 MED ORDER — INSULIN ASPART 100 UNIT/ML IJ SOLN: 0.0000 [IU] | Freq: Three times a day (TID) | INTRAMUSCULAR | Status: DC

## 2020-08-28 MED ORDER — ACETAMINOPHEN 500 MG PO TABS
1000.0000 mg | ORAL_TABLET | Freq: Three times a day (TID) | ORAL | Status: DC
  Administered 2020-08-28: 1000 mg via ORAL
  Filled 2020-08-28: qty 2

## 2020-08-28 MED ORDER — LACTATED RINGERS IV BOLUS
1000.0000 mL | Freq: Once | INTRAVENOUS | Status: AC
  Administered 2020-08-28: 1000 mL via INTRAVENOUS

## 2020-08-28 NOTE — Progress Notes (Signed)
Nutrition Brief Note  Patient identified for consult per hip fracture protocol.   Wt Readings from Last 15 Encounters:  08/27/20 62.1 kg  04/24/20 63.5 kg  04/21/20 63.7 kg  08/17/16 66.7 kg  08/06/16 66.9 kg  09/03/15 65.8 kg  08/29/15 65.8 kg  03/28/15 64.1 kg  02/13/15 63 kg  10/02/13 58.8 kg    Body mass index is 23.52 kg/m. Patient meets criteria for normal weight based on current BMI. Weight yesterday was 137 lb and weight on 04/24/20 was 140 lb. This indicates 3 lb weight loss (2% body weight) in the past 4 months; not significant for time frame.   Patient tripped and fell over a curb when she presented to Orthopedic office for a follow-up appointment. She was noted to have periprosthetic fx of L hip.   Current diet order is Heart Healthy/Carb Modified. She received breakfast tray ~1 hour ago and ate 100% of meal. Lunch tray has been ordered. Patient has a good appetite with no recent changes at baseline. She eats low fat, nutrient-rich foods and prefers baking or grilling. The only meats she eats are fish and poultry.   Skin WDL. Labs and medications reviewed.   No nutrition interventions warranted at this time. If nutrition issues arise, please re-consult RD.      Trenton Gammon, MS, RD, LDN, CNSC Inpatient Clinical Dietitian RD pager # available in [facility]  After hours/weekend pager # available in [facility]

## 2020-08-28 NOTE — Plan of Care (Signed)
  Problem: Education: Goal: Knowledge of General Education information will improve Description: Including pain rating scale, medication(s)/side effects and non-pharmacologic comfort measures 08/28/2020 1930 by Towanda Malkin, RN Outcome: Progressing 08/28/2020 1929 by Towanda Malkin, RN Outcome: Progressing   Problem: Pain Managment: Goal: General experience of comfort will improve Outcome: Progressing   Problem: Pain Management: Goal: Pain level will decrease Outcome: Progressing

## 2020-08-28 NOTE — Evaluation (Signed)
Physical Therapy Evaluation Patient Details Name: [name] MRN: [id number] DOB: August 10, 1940 Today's Date: 08/28/2020   History of Present Illness  Pt s/p fall with undisplaced greater trochanteric peri-prosthetic fx - non-operative at this time per Dr Linna Caprice.  Pt with hx of L3-5 microlumbar decompression (1/22), DM, L TKR and bil THR  Clinical Impression  Pt admitted as above and presenting with functional mobility limitations 2* decreased L LE strength/ROM, increased pain with mobility and orthostatic hypotension with attempt to ambulate (BP 56/36).  Pt very motivated to progress and return home with 24/7 family assist.    Follow Up Recommendations Home health PT    Equipment Recommendations  Other (comment) (Pt states will borrow RW from friend as before)    Recommendations for Other Services OT consult     Precautions / Restrictions Precautions Precautions: Fall;Other (comment) Precaution Comments: NO active abd L hip Restrictions Weight Bearing Restrictions: No Other Position/Activity Restrictions: WBAT      Mobility  Bed Mobility               General bed mobility comments: Pt up in chair on arrival and returned to same with c/o dizziness    Transfers Overall transfer level: Needs assistance Equipment used: Rolling walker (2 wheeled) Transfers: Sit to/from Stand Sit to Stand: Min assist         General transfer comment: cues for LE management and use of UEs to self assist  Ambulation/Gait Ambulation/Gait assistance: Min assist;Mod assist Gait Distance (Feet): 21 Feet Assistive device: Rolling walker (2 wheeled) Gait Pattern/deviations: Step-to pattern;Decreased stance time - left;Decreased step length - right;Shuffle;Trunk flexed     General Gait Details: cues for sequence, posture and position from RW.  Distance ltd by c/o dizziness - BP 56/36  Stairs            Wheelchair Mobility    Modified Rankin (Stroke Patients Only)        Balance Overall balance assessment: Needs assistance Sitting-balance support: Feet supported;No upper extremity supported Sitting balance-Leahy Scale: Good     Standing balance support: Bilateral upper extremity supported Standing balance-Leahy Scale: Poor                               Pertinent Vitals/Pain Pain Assessment: Faces Faces Pain Scale: Hurts little more Pain Location: L hip Pain Descriptors / Indicators: Aching;Sore Pain Intervention(s): Limited activity within patient's tolerance;Monitored during session;Premedicated before session    Home Living Family/patient expects to be discharged to:: Private residence   Available Help at Discharge: Family;Available 24 hours/day Type of Home: House Home Access: Stairs to enter Entrance Stairs-Rails: Right;Left;Can reach both Entrance Stairs-Number of Steps: 4 Home Layout: One level Home Equipment: Grab bars - tub/shower;Other (comment);Hand held shower head;Shower seat Additional Comments: Pt reports family can assist as much as needed and she plans to borrow RW from friend.    Prior Function Level of Independence: Independent               Hand Dominance   Dominant Hand: Right    Extremity/Trunk Assessment   Upper Extremity Assessment Upper Extremity Assessment: Overall WFL for tasks assessed    Lower Extremity Assessment Lower Extremity Assessment: LLE deficits/detail    Cervical / Trunk Assessment Cervical / Trunk Assessment: Normal  Communication   Communication: HOH  Cognition Arousal/Alertness: Awake/alert Behavior During Therapy: WFL for tasks assessed/performed;Impulsive Overall Cognitive Status: Within Functional Limits for tasks assessed  General Comments      Exercises     Assessment/Plan    PT Assessment Patient needs continued PT services  PT Problem List Decreased strength;Decreased range of motion;Decreased  activity tolerance;Decreased balance;Decreased mobility;Decreased knowledge of use of DME;Pain       PT Treatment Interventions DME instruction;Gait training;Stair training;Functional mobility training;Therapeutic activities;Therapeutic exercise;Patient/family education    PT Goals (Current goals can be found in the Care Plan section)  Acute Rehab PT Goals Patient Stated Goal: HOME PT Goal Formulation: With patient Time For Goal Achievement: 08/28/20 Potential to Achieve Goals: Good    Frequency Min 5X/week   Barriers to discharge        Co-evaluation               AM-PAC PT "6 Clicks" Mobility  Outcome Measure Help needed turning from your back to your side while in a flat bed without using bedrails?: A Lot Help needed moving from lying on your back to sitting on the side of a flat bed without using bedrails?: A Lot Help needed moving to and from a bed to a chair (including a wheelchair)?: A Lot Help needed standing up from a chair using your arms (e.g., wheelchair or bedside chair)?: A Little Help needed to walk in hospital room?: A Lot Help needed climbing 3-5 steps with a railing? : A Lot 6 Click Score: 13    End of Session Equipment Utilized During Treatment: Gait belt Activity Tolerance: Patient limited by fatigue;Other (comment) (orthostatic) Patient left: in chair;with call bell/phone within reach;with family/visitor present;with nursing/sitter in room Nurse Communication: Mobility status PT Visit Diagnosis: Difficulty in walking, not elsewhere classified (R26.2)    Time: 3299-2426 PT Time Calculation (min) (ACUTE ONLY): 28 min   Charges:   PT Evaluation $PT Eval Low Complexity: 1 Low PT Treatments $Gait Training: 8-22 mins        Mauro Kaufmann PT Acute Rehabilitation Services Pager [phone] Office [phone]   [name] 08/28/2020, 12:21 PM

## 2020-08-28 NOTE — Progress Notes (Signed)
Just paged Dr. Lowell Guitar, MD to inform that rapid response was called due to low BP. Pt was getting up with physical therapy. Waiting on bolus order and other further orders.

## 2020-08-28 NOTE — TOC Initial Note (Signed)
Transition of Care [facility]) - Initial/Assessment Note    Patient Details  Name: [name] MRN: [id number] Date of Birth: 1940-10-23  Transition of [facility]) CM/SW Contact:    Lennart Pall, LCSW Phone Number: 08/28/2020, 10:43 AM  Clinical Narrative:                 Met with pt and granddaughter this morning to review potential dc needs.  Pt very talkative and pleasant as she reports that she has a large family living close by and "they will take care of me."  Granddaughter confirms that family members are "taking shifts" to ensure pt has 24/7 support at home.  Pt notes she has had several ortho surgeries in the past years and feels very comfortable managing at home with family support.  She does not feel she will need any HH services "because I know what to do and will take care of that on my own."  Pt awaiting MD visit and decision on when she will be released.  No TOC needs identified, however, will monitor until dc.  Expected Discharge Plan: Home/Self Care Barriers to Discharge: Continued Medical Work up   Patient Goals and CMS Choice Patient states their goals for this hospitalization and ongoing recovery are:: return home      Expected Discharge Plan and Services Expected Discharge Plan: Home/Self Care In-house Referral: Clinical Social Work     Living arrangements for the past 2 months: Single Family Home                 DME Arranged: N/A DME Agency: NA                  Prior Living Arrangements/Services Living arrangements for the past 2 months: Single Family Home Lives with:: Self Patient language and need for interpreter reviewed:: Yes Do you feel safe going back to the place where you live?: Yes      Need for Family Participation in Patient Care: Yes (Comment) Care giver support system in place?: Yes (comment)   Criminal Activity/Legal Involvement Pertinent to Current Situation/Hospitalization: No - Comment as needed  Activities of Daily Living Home  Assistive Devices/Equipment: Hearing aid,Eyeglasses (bilateral hearing aides) ADL Screening (condition at time of admission) Patient's cognitive ability adequate to safely complete daily activities?: Yes Is the patient deaf or have difficulty hearing?: Yes (wears 2 hearing aides) Does the patient have difficulty seeing, even when wearing glasses/contacts?: No Does the patient have difficulty concentrating, remembering, or making decisions?: No Patient able to express need for assistance with ADLs?: Yes Does the patient have difficulty dressing or bathing?: No Independently performs ADLs?: No Communication: Independent Dressing (OT): Independent Grooming: Independent Feeding: Independent Bathing: Needs assistance Is this a change from baseline?: Change from baseline, expected to last >3 days Toileting: Needs assistance Is this a change from baseline?: Change from baseline, expected to last >3days In/Out Bed: Needs assistance Is this a change from baseline?: Change from baseline, expected to last >3 days Walks in Home: Needs assistance Is this a change from baseline?: Change from baseline, expected to last >3 days Does the patient have difficulty walking or climbing stairs?: Yes Weakness of Legs: Left Weakness of Arms/Hands: Right (right hand weak)  Permission Sought/Granted                  Emotional Assessment Appearance:: Appears stated age Attitude/Demeanor/Rapport: Engaged,Self-Confident Affect (typically observed): Accepting,Appropriate Orientation: : Oriented to Self,Oriented to Place,Oriented to  Time,Oriented to Situation Alcohol /  Substance Use: Not Applicable Psych Involvement: No (comment)  Admission diagnosis:  Preop examination [Z01.818] Periprosthetic fracture around internal prosthetic left hip joint ([place]) [M70.02XA] Closed fracture of left hip, initial encounter [facility]) [S72.002A] Patient Active Problem List   Diagnosis Date Noted  . Periprosthetic fracture  around internal prosthetic left hip joint ([place]) 08/27/2020  . Spinal stenosis at L4-L5 level 04/24/2020  . Pelvic prolapse 08/17/2016  . S/P shoulder replacement 09/03/2015  . S/P total knee replacement 03/28/2015  . Hypokalemia 10/02/2013  . Diarrhea 10/02/2013  . Diabetes mellitus ([place]) 10/02/2013  . HTN (hypertension) 10/02/2013  . Hypothyroidism 10/02/2013   PCP:  Cyndi Bender, PA-C Pharmacy:   [place] [place] [address] Phone: [phone] Fax: [phone]     Social Determinants of Health (SDOH) Interventions    Readmission Risk Interventions Readmission Risk Prevention Plan 08/28/2020  Post Dischage Appt Complete  Medication Screening Complete  Transportation Screening Complete  Some recent data might be hidden

## 2020-08-28 NOTE — Progress Notes (Signed)
PROGRESS NOTE    [name]  ENI:[id number] DOB: [birth year] DOA: 08/27/2020 PCP: Lonie Peak, PA-C   Chief Complaint  Patient presents with  . Hip Pain   Brief Narrative:  [name] is [name] 80 y.o. female with [name] known history of hypertension, type 2 diabetes, hypothyroidism presents to the emergency department for evaluation of left hip pain status post fall.  Patient was in [name] usual state of health until today when she was visiting orthopedics for [name] follow-up regarding her left total knee replacement, she she sustained [name] mechanical fall landing on her left side.  She did not have any loss of consciousness, states that she tripped over [place] curb.  Brought into the orthopedist office where she underwent some imaging which revealed possible periprosthetic fracture of her left hip and was therefore referred to the emergency department for additional imaging.  Patient denies fevers/chills, weakness, dizziness, chest pain, shortness of breath, N/V/C/D, abdominal pain, dysuria/frequency, changes in mental status.    Otherwise there has been no change in status. Patient has been taking medication as prescribed and there has been no recent change in medication or diet.  No recent antibiotics.  There has been no recent illness, hospitalizations, travel or sick contacts.    Assessment & Plan:   Active Problems:   Periprosthetic fracture around internal prosthetic left hip joint [name])  This is [name] 80 y.o. female with [name] history of hypertension, type 2 diabetes, hypothyroidism now being admitted with:  #.  Left periprosthetic hip fracture - Orthopedics has been consulted by the emergency department physician and recommends nonsurgical management with weightbearing as tolerated with Sandra Goyne walker and no active abduction for 6 weeks. - Obtain PT evaluation - Pain control  # Orthostatic Hypotension - follow with bolus, hold lisinopril  # Sinus Bradycardia - she notes long history of this,  doesn't seem to be symptomatic, follow closely with hypotension noted above - EKG with sinus brady  #.  H/o Diabetes - SSI - Heart healthy, carb controlled diet - a1c 5.8  #.  History of hypothyroidism - Continue Synthroid  #. History of hypertension - Continue lisinopril  DVT prophylaxis: heparin  Code Status: full Family Communication: none at bedside Disposition:   Status is: Inpatient  Remains inpatient appropriate because:Inpatient level of care appropriate due to severity of illness   Dispo: The patient is from: Home              Anticipated d/c is to: Home              Patient currently is not medically stable to d/c.   Difficult to place patient No       Consultants:   orthopedics  Procedures:   none  Antimicrobials: Anti-infectives (From admission, onward)   None         Subjective: No new complaints  Objective: Vitals:   08/27/20 2129 08/28/20 0444 08/28/20 0939 08/28/20 1315  BP: (!) 154/64 (!) 118/45 113/75 (!) 124/49  Pulse: (!) 55 (!) 48 (!) 45 (!) 46  Resp: 17 16 16 18   Temp: 98.2 F (36.8 C) 98.7 F (37.1 C) 98.5 F (36.9 C) 97.7 F (36.5 C)  TempSrc: Oral  Oral Oral  SpO2: 100% 96% 100% 98%  Weight:      Height:        Intake/Output Summary (Last 24 hours) at 08/28/2020 1852 Last data filed at 08/28/2020 1514 Gross per 24 hour  Intake 950 ml  Output 900 ml  Net 50 ml   Filed Weights   08/27/20 1809 08/27/20 1825  Weight: 62.1 kg 62.1 kg    Examination:  General exam: Appears calm and comfortable  Respiratory system: Clear to auscultation. Respiratory effort normal. Cardiovascular system: bradycardic, regular Gastrointestinal system: Abdomen is nondistended, soft and nontender Central nervous system: Alert and oriented. No focal neurological deficits. Extremities: no LEE, no sig TTP to L hip Skin: No rashes, lesions or ulcers Psychiatry: Judgement and insight appear normal. Mood & affect appropriate.      Data Reviewed: I have personally reviewed following labs and imaging studies  CBC: Recent Labs  Lab 08/27/20 1856 08/27/20 2257 08/28/20 0738  WBC 8.1 6.7 6.1  NEUTROABS  --   --  4.0  HGB 13.1 12.0 11.7*  HCT 38.8 35.9* 35.1*  MCV 86.6 86.5 86.5  PLT 175 160 163    Basic Metabolic Panel: Recent Labs  Lab 08/27/20 1856 08/27/20 2257 08/28/20 0738  NA 135  --  135  K 4.0  --  4.0  CL 97*  --  100  CO2 27  --  27  GLUCOSE 98  --  102*  BUN 20  --  19  CREATININE 0.75 0.65 0.76  CALCIUM 9.8  --  9.2    GFR: Estimated Creatinine Clearance: 47.6 mL/min (by C-G formula based on SCr of 0.76 mg/dL).  Liver Function Tests: Recent Labs  Lab 08/28/20 0738  AST 24  ALT 15  ALKPHOS 102  BILITOT 0.7  PROT 6.8  ALBUMIN 3.8    CBG: Recent Labs  Lab 08/28/20 0141 08/28/20 0408 08/28/20 0731 08/28/20 1211 08/28/20 1642  GLUCAP 122* 113* 103* 93 121*     Recent Results (from the past 240 hour(s))  Resp Panel by RT-PCR (Flu Dajia Gunnels&B, Covid) Nasopharyngeal Swab     Status: None   Collection Time: 08/27/20  6:57 PM   Specimen: Nasopharyngeal Swab; Nasopharyngeal(NP) swabs in vial transport medium  Result Value Ref Range Status   SARS Coronavirus 2 by RT PCR NEGATIVE NEGATIVE Final    Comment: (NOTE) SARS-CoV-2 target nucleic acids are NOT DETECTED.  The SARS-CoV-2 RNA is generally detectable in upper respiratory specimens during the acute phase of infection. The lowest concentration of SARS-CoV-2 viral copies this assay can detect is 138 copies/mL. [name] negative result does not preclude SARS-Cov-2 infection and should not be used as the sole basis for treatment or other patient management decisions. [name] negative result may occur with  improper specimen collection/handling, submission of specimen other than nasopharyngeal swab, presence of viral mutation(s) within the areas targeted by this assay, and inadequate number of viral copies(<138 copies/mL). Maddeline Figueroa negative  result must be combined with clinical observations, patient history, and epidemiological information. The expected result is Negative.  Fact Sheet for Patients:  BloggerCourse.com  Fact Sheet for Healthcare Providers:  SeriousBroker.it  This test is no t yet approved or cleared by the [place] FDA and  has been authorized for detection and/or diagnosis of SARS-CoV-2 by FDA under an Emergency Use Authorization (EUA). This EUA will remain  in effect (meaning this test can be used) for the duration of the COVID-19 declaration under Section 564(b)(1) of the Act, 21 U.S.C.section 360bbb-3(b)(1), unless the authorization is terminated  or revoked sooner.       Influenza Antron Seth by PCR NEGATIVE NEGATIVE Final   Influenza B by PCR NEGATIVE NEGATIVE Final    Comment: (NOTE) The Xpert Xpress SARS-CoV-2/FLU/RSV plus assay is intended as an aid in  the diagnosis of influenza from Nasopharyngeal swab specimens and should not be used as Mariano Doshi sole basis for treatment. Nasal washings and aspirates are unacceptable for Xpert Xpress SARS-CoV-2/FLU/RSV testing.  Fact Sheet for Patients: BloggerCourse.com  Fact Sheet for Healthcare Providers: SeriousBroker.it  This test is not yet approved or cleared by the [place] FDA and has been authorized for detection and/or diagnosis of SARS-CoV-2 by FDA under an Emergency Use Authorization (EUA). This EUA will remain in effect (meaning this test can be used) for the duration of the COVID-19 declaration under Section 564(b)(1) of the Act, 21 U.S.C. section 360bbb-3(b)(1), unless the authorization is terminated or revoked.  Performed at [facility], [address], [place], [place] [address]          Radiology Studies: CT HIP LEFT WO CONTRAST  Result Date: 08/27/2020 CLINICAL DATA:  Recent fall with hip pain, initial encounter  EXAM: CT OF THE LEFT HIP WITHOUT CONTRAST TECHNIQUE: Multidetector CT imaging of the left hip was performed according to the standard protocol. Multiplanar CT image reconstructions were also generated. COMPARISON:  None. FINDINGS: Bones/Joint/Cartilage Degenerative changes of the left sacroiliac joint are seen. Left hip prosthesis is seen. Periprosthetic fracture is noted in the region of the greater trochanter. This is best seen on the sagittal and coronal reconstructions. No other definitive fracture is seen. Ligaments Suboptimally assessed by CT. Muscles and Tendons Musculature demonstrates some fatty infiltration consistent with decreased use. Soft tissues Surrounding soft tissue structures demonstrate some subcutaneous edema related to the recent injury. No definitive joint effusion is seen. IMPRESSION: Undisplaced greater trochanter fractures surrounding the known hip prosthesis. No other focal abnormality is seen. Electronically Signed   By: Alcide Clever M.D.   On: 08/27/2020 20:39   Chest Portable 1 View  Result Date: 08/27/2020 CLINICAL DATA:  Preop evaluation EXAM: PORTABLE CHEST 1 VIEW COMPARISON:  None. FINDINGS: Cardiac shadow is at the upper limits of normal in size. Aortic calcifications are noted. No acute infiltrate or sizable effusion is seen. Postsurgical changes in the right shoulder are noted. Degenerative changes in the left shoulder are seen. IMPRESSION: No acute abnormality noted. Electronically Signed   By: Alcide Clever M.D.   On: 08/27/2020 22:56        Scheduled Meds: . acetaminophen  1,000 mg Oral Q8H  . heparin  5,000 Units Subcutaneous Q8H  . insulin aspart  0-15 Units Subcutaneous Q4H  . levothyroxine  100 mcg Oral Q0600   Continuous Infusions:   LOS: 1 day    Time spent: over 30 min    Lacretia Nicks, MD [facility]   To contact the attending provider between 7A-7P or the covering provider during after hours 7P-7A, please log into the web site  [email or web address] and access using universal [place] password for that web site. If you do not have the password, please call the hospital operator.  08/28/2020, 6:52 PM

## 2020-08-29 DIAGNOSIS — S72002A Fracture of unspecified part of neck of left femur, initial encounter for closed fracture: Secondary | ICD-10-CM

## 2020-08-29 LAB — COMPREHENSIVE METABOLIC PANEL
ALT: 12 U/L (ref 0–44)
AST: 19 U/L (ref 15–41)
Albumin: 3.4 g/dL — ABNORMAL LOW (ref 3.5–5.0)
Alkaline Phosphatase: 102 U/L (ref 38–126)
Anion gap: 5 (ref 5–15)
BUN: 19 mg/dL (ref 8–23)
CO2: 30 mmol/L (ref 22–32)
Calcium: 8.6 mg/dL — ABNORMAL LOW (ref 8.9–10.3)
Chloride: 99 mmol/L (ref 98–111)
Creatinine, Ser: 0.76 mg/dL (ref 0.44–1.00)
GFR, Estimated: >60 mL/min (ref >60)
Glucose, Bld: 102 mg/dL — ABNORMAL HIGH (ref 70–99)
Potassium: 4.4 mmol/L (ref 3.5–5.1)
Sodium: 134 mmol/L — ABNORMAL LOW (ref 135–145)
Total Bilirubin: 0.4 mg/dL (ref 0.3–1.2)
Total Protein: 6.3 g/dL — ABNORMAL LOW (ref 6.5–8.1)

## 2020-08-29 LAB — CBC WITH DIFFERENTIAL/PLATELET
Abs Immature Granulocytes: 0.02 10*3/uL (ref 0.00–0.07)
Basophils Absolute: 0 10*3/uL (ref 0.0–0.1)
Basophils Relative: 0 %
Eosinophils Absolute: 0.1 10*3/uL (ref 0.0–0.5)
Eosinophils Relative: 3 %
HCT: 31.4 % — ABNORMAL LOW (ref 36.0–46.0)
Hemoglobin: 10.4 g/dL — ABNORMAL LOW (ref 12.0–15.0)
Immature Granulocytes: 0 %
Lymphocytes Relative: 33 %
Lymphs Abs: 1.5 10*3/uL (ref 0.7–4.0)
MCH: 28.8 pg (ref 26.0–34.0)
MCHC: 33.1 g/dL (ref 30.0–36.0)
MCV: 87 fL (ref 80.0–100.0)
Monocytes Absolute: 0.6 10*3/uL (ref 0.1–1.0)
Monocytes Relative: 13 %
Neutro Abs: 2.3 10*3/uL (ref 1.7–7.7)
Neutrophils Relative %: 51 %
Platelets: 127 10*3/uL — ABNORMAL LOW (ref 150–400)
RBC: 3.61 MIL/uL — ABNORMAL LOW (ref 3.87–5.11)
RDW: 13.3 % (ref 11.5–15.5)
WBC: 4.6 10*3/uL (ref 4.0–10.5)
nRBC: 0 % (ref 0.0–0.2)

## 2020-08-29 LAB — GLUCOSE, CAPILLARY: Glucose-Capillary: 98 mg/dL (ref 70–99)

## 2020-08-29 LAB — PHOSPHORUS: Phosphorus: 4.2 mg/dL (ref 2.5–4.6)

## 2020-08-29 LAB — MAGNESIUM: Magnesium: 2 mg/dL (ref 1.7–2.4)

## 2020-08-29 NOTE — Progress Notes (Signed)
Physical Therapy Treatment Patient Details Name: [name] MRN: [id number] DOB: 30-Apr-1940 Today's Date: 08/29/2020    History of Present Illness Pt s/p fall with undisplaced greater trochanteric peri-prosthetic fx - non-operative at this time per Dr Linna Caprice.  Pt with hx of L3-5 microlumbar decompression (1/22), DM, L TKR and bil THR    PT Comments     Pt feeling much better, OOB and fully dressed.  "I already walked this morning".  Assisted with amb in hallway, practiced 4 steps she has to enter her home Then returned to room to perform some TE's following HEP handout.  Instructed on proper tech, freq as well as use of ICE.  Pt aware she is NOT to ABD her L hip (crossed off those TE's on her HEP)  Pt very knowledgeable as she has had B THR and a TKR.    Addressed all mobility questions, discussed appropriate activity level.  Pt ready for D/C to home.   Follow Up Recommendations  Home health PT     Equipment Recommendations  None recommended by PT    Recommendations for Other Services       Precautions / Restrictions Precautions Precautions: Fall Precaution Comments: NO active abd L hip Restrictions Other Position/Activity Restrictions: WBAT    Mobility  Bed Mobility               General bed mobility comments: OOB in recliner    Transfers Overall transfer level: Needs assistance Equipment used: Rolling walker (2 wheeled) Transfers: Sit to/from [employer] Sit to Stand: Supervision Stand pivot transfers: Supervision       General transfer comment: cues for LE management and use of UEs to self assist  Ambulation/Gait Ambulation/Gait assistance: Supervision Gait Distance (Feet): 125 Feet   Gait Pattern/deviations: Step-to pattern;Decreased stance time - left;Decreased step length - right;Shuffle;Trunk flexed Gait velocity: decreased   General Gait Details: tolerated a functional distance with good safety cognition and use of  walker   Stairs Stairs: Yes Stairs assistance: Supervision;Min guard Stair Management: Two rails;Step to pattern;Forwards Number of Stairs: 3 General stair comments: only required one initial VC on proper sequencing and safety   Wheelchair Mobility    Modified Rankin (Stroke Patients Only)       Balance                                            Cognition Arousal/Alertness: Awake/alert Behavior During Therapy: WFL for tasks assessed/performed;Impulsive Overall Cognitive Status: Within Functional Limits for tasks assessed                                 General Comments: AxO x 3 very motivated "I've had surgery before"      Exercises  20 reps B LE AP, knee presses, HS, SAQ's NO ABD and avoid doing SLR     General Comments        Pertinent Vitals/Pain Pain Assessment: Faces Faces Pain Scale: Hurts a little bit Pain Location: L hip Pain Descriptors / Indicators: Aching;Sore;Operative site guarding Pain Intervention(s): Monitored during session;Premedicated before session;Repositioned;Ice applied    Home Living                      Prior Function  PT Goals (current goals can now be found in the care plan section) Progress towards PT goals: Progressing toward goals    Frequency    Min 5X/week      PT Plan Current plan remains appropriate    Co-evaluation              AM-PAC PT "6 Clicks" Mobility   Outcome Measure  Help needed turning from your back to your side while in a flat bed without using bedrails?: A Little Help needed moving from lying on your back to sitting on the side of a flat bed without using bedrails?: A Little Help needed moving to and from a bed to a chair (including a wheelchair)?: A Little Help needed standing up from a chair using your arms (e.g., wheelchair or bedside chair)?: A Little Help needed to walk in hospital room?: A Little Help needed climbing 3-5 steps with a  railing? : A Little 6 Click Score: 18    End of Session Equipment Utilized During Treatment: Gait belt Activity Tolerance: Patient tolerated treatment well Patient left: in chair;with call bell/phone within reach;with family/visitor present;with nursing/sitter in room Nurse Communication: Mobility status (Pt ready for D/C to home) PT Visit Diagnosis: Difficulty in walking, not elsewhere classified (R26.2)     Time: 6761-9509 PT Time Calculation (min) (ACUTE ONLY): 25 min  Charges:  $Gait Training: 8-22 mins $Therapeutic Exercise: 23-37 mins                     Felecia Shelling  PTA Acute  Rehabilitation Services Pager      [phone] Office      [phone]

## 2020-08-29 NOTE — Plan of Care (Signed)
Plan of care reviewed and discussed with the patient. 

## 2020-08-29 NOTE — Consult Note (Signed)
ORTHOPAEDIC CONSULTATION  REQUESTING PHYSICIAN: Zigmund Daniel., *  PCP:  Lonie Peak, PA-C  Chief Complaint: Left hip injury  HPI: [name] is a 80 y.o. female who complains of left hip pain and difficulty weightbearing after she fell onto her left side.  X-rays were obtained showing periprosthetic left femur fracture involving the greater trochanter.  She has a history of left total hip arthroplasty remotely.  She was then taken to the emergency department at [facility].  Orthopedic consultation was placed for management of her periprosthetic left hip fracture.  CT scan was obtained showing nondisplaced [place] AG periprosthetic femur fracture.  She was admitted to the hospitalist for pain control.  Past Medical History:  Diagnosis Date  . Diabetes mellitus without complication (HCC)    hx of 2006 no longer on meds, lost weight 40lbs, no longer a diabetic per patient,  . Hearing loss    wears hearing aids  . Hypertension   . Hypothyroidism   . Thyroid disease   . Wears dentures    Past Surgical History:  Procedure Laterality Date  . ANTERIOR AND POSTERIOR REPAIR WITH SACROSPINOUS FIXATION N/A 08/17/2016   Procedure: ANTERIOR AND POSTERIOR REPAIR;  Surgeon: Harold Hedge, MD;  Location: [facility] ORS;  Service: Gynecology;  Laterality: N/A;  . APPENDECTOMY    . bilateral cataract surgery     . CHOLECYSTECTOMY    . HAND SURGERY Right    Gramig  . HAND SURGERY Left    dr Amanda Pea  . LAPAROSCOPIC VAGINAL HYSTERECTOMY WITH SALPINGO OOPHORECTOMY Bilateral 08/17/2016   Procedure: LAPAROSCOPIC ASSISTED VAGINAL HYSTERECTOMY WITH SALPINGO OOPHORECTOMY;  Surgeon: Harold Hedge, MD;  Location: [facility];  Service: Gynecology;  Laterality: Bilateral;  . LUMBAR LAMINECTOMY/DECOMPRESSION MICRODISCECTOMY N/A 04/24/2020   Procedure: Microlumbar decompression Lumbar Four-Five Lumbar Three-Four lateral mass fusion with autologous/allograft bone;  Surgeon: Jene Every, MD;   Location: [facility] OR;  Service: Orthopedics;  Laterality: N/A;  posterior  . REVERSE SHOULDER ARTHROPLASTY Right 09/03/2015   Procedure: RIGHT REVERSE SHOULDER ARTHROPLASTY;  Surgeon: Beverely Low, MD;  Location: [facility] OR;  Service: Orthopedics;  Laterality: Right;  . THYROIDECTOMY    . TONSILLECTOMY    . TOTAL HIP ARTHROPLASTY     bilateral  . TOTAL KNEE ARTHROPLASTY Left 03/28/2015   Procedure: LEFT TOTAL KNEE ARTHROPLASTY;  Surgeon: Eugenia Mcalpine, MD;  Location: [facility] ORS;  Service: Orthopedics;  Laterality: Left;  . TUBAL LIGATION    . URETHRAL DILATION     Social History   Socioeconomic History  . Marital status: Married    Spouse name: Not on file  . Number of children: Not on file  . Years of education: Not on file  . Highest education level: Not on file  Occupational History  . Not on file  Tobacco Use  . Smoking status: Never Smoker  . Smokeless tobacco: Never Used  Vaping Use  . Vaping Use: Never used  Substance and Sexual Activity  . Alcohol use: No  . Drug use: No  . Sexual activity: Not Currently    Birth control/protection: Surgical    Comment: Hysterectomy  Other Topics Concern  . Not on file  Social History Narrative  . Not on file   Social Determinants of Health   Financial Resource Strain: Not on file  Food Insecurity: Not on file  Transportation Needs: Not on file  Physical Activity: Not on file  Stress: Not on file  Social Connections: Not on file   Family  History  Problem Relation Age of Onset  . CAD Neg Hx   . Diabetes Mellitus II Neg Hx    Allergies  Allergen Reactions  . Penicillins Swelling    UNSPECIFIED SWELLING. Has patient had reaction causing immediate rash, facial/tongue/throat swelling, SOB or lightheadedness, hypotension: No Reaction causing severe rash involving mucus membranes or skin necrosis: No Has patient had a PCN reaction that required hospitalization No Has patient had a PCN reaction occurring within the last 10 years: No If  all of the above answers are "NO", then may proceed with Cephalosporin use.   Marland Kitchen Oxycodone Other (See Comments)    Patient Preference  Makes her feel "weird" and she prefers not to take it   Prior to Admission medications   Medication Sig Start Date End Date Taking? Authorizing Provider  acetaminophen (TYLENOL) 500 MG tablet Take 500 mg by mouth every 8 (eight) hours as needed for moderate pain.   Yes [provider]  levothyroxine (SYNTHROID, LEVOTHROID) 100 MCG tablet Take 100 mcg by mouth daily before breakfast.  09/04/13  Yes [provider]  lisinopril-hydrochlorothiazide (ZESTORETIC) 10-12.5 MG tablet Take 1 tablet by mouth daily. 08/25/20  Yes [provider]  Misc Natural Products (IMMUNE FORMULA PO) Take 1 tablet by mouth daily.   Yes [provider]   CT HIP LEFT WO CONTRAST  Result Date: 08/27/2020 CLINICAL DATA:  Recent fall with hip pain, initial encounter EXAM: CT OF THE LEFT HIP WITHOUT CONTRAST TECHNIQUE: Multidetector CT imaging of the left hip was performed according to the standard protocol. Multiplanar CT image reconstructions were also generated. COMPARISON:  None. FINDINGS: Bones/Joint/Cartilage Degenerative changes of the left sacroiliac joint are seen. Left hip prosthesis is seen. Periprosthetic fracture is noted in the region of the greater trochanter. This is best seen on the sagittal and coronal reconstructions. No other definitive fracture is seen. Ligaments Suboptimally assessed by CT. Muscles and Tendons Musculature demonstrates some fatty infiltration consistent with decreased use. Soft tissues Surrounding soft tissue structures demonstrate some subcutaneous edema related to the recent injury. No definitive joint effusion is seen. IMPRESSION: Undisplaced greater trochanter fractures surrounding the known hip prosthesis. No other focal abnormality is seen. Electronically Signed   By: Alcide Clever M.D.   On: 08/27/2020 20:39   Chest  Portable 1 View  Result Date: 08/27/2020 CLINICAL DATA:  Preop evaluation EXAM: PORTABLE CHEST 1 VIEW COMPARISON:  None. FINDINGS: Cardiac shadow is at the upper limits of normal in size. Aortic calcifications are noted. No acute infiltrate or sizable effusion is seen. Postsurgical changes in the right shoulder are noted. Degenerative changes in the left shoulder are seen. IMPRESSION: No acute abnormality noted. Electronically Signed   By: Alcide Clever M.D.   On: 08/27/2020 22:56    Positive ROS: All other systems have been reviewed and were otherwise negative with the exception of those mentioned in the HPI and as above.  Physical Exam: General: Alert, no acute distress Cardiovascular: No pedal edema Respiratory: No cyanosis, no use of accessory musculature GI: No organomegaly, abdomen is soft and non-tender Skin: No lesions in the area of chief complaint Neurologic: Sensation intact distally Psychiatric: Patient is competent for consent with normal mood and affect Lymphatic: No axillary or cervical lymphadenopathy  MUSCULOSKELETAL: Examination left hip reveals healed posterior incision.  No significant pain with logrolling.  She is neurovascularly intact.  Assessment: Left [place] AG periprosthetic femur fracture, amenable to conservative treatment.  Plan: I discussed the findings with the patient.  She has periprosthetic fracture involving the greater trochanter.  Her implant is stable.  Therefore, she can be treated nonoperatively.  She may weight-bear as tolerated with a walker.  Avoid active abduction for 6 weeks.  I will see her back 2 weeks after discharge for repeat radiographs.  All questions were solicited and answered.    Jonette Pesa, MD [phone]    08/29/2020 8:01 AM

## 2020-08-29 NOTE — Discharge Summary (Signed)
Physician Discharge Summary  [name] TML:[id number] DOB: July 19, 1940 DOA: 08/27/2020  PCP: Lonie Peak, PA-C  Admit date: 08/27/2020 Discharge date: 08/29/2020  Time spent: 40 minutes  Recommendations for Outpatient Follow-up:  1. Follow outpatient CBC/CMP 2. Follow with orthopedics in about 2 weeks 3. WBAT with walker  4. Hypotension here, brief, episode, orthostatic? BP meds stopped at discharge.  Orthostatics negative on day of discharge.  Follow outpatient off BP meds.  Further w/u per PCP outpatient. 5. Sinus bradycardia, chronic, appears asx, follow outpatient   Discharge Diagnoses:  Active Problems:   Periprosthetic fracture around internal prosthetic left hip joint [facility])   Discharge Condition: stable  Diet recommendation: heart healthy  Filed Weights   08/27/20 1809 08/27/20 1825  Weight: 62.1 kg 62.1 kg    History of present illness:  [name] 80 y.o.femalewith [name] known history of hypertension, type 2 diabetes, hypothyroidismpresents to the emergency department for evaluation of left hip pain status post fall. Patient was in [name] usual state of health until today when she was visiting orthopedics for [name] follow-up regarding her left total knee replacement, she she sustained [name] mechanical fall landing on her left side. She did not have any loss of consciousness, states that she tripped over [name] curb. Brought into the orthopedist office where she underwent some imaging which revealed possible periprosthetic fracture of her left hip and was therefore referred to the emergency department for additional imaging.  Imaging was notable for undisplaced greater trochanter fractures surrounding known hip prosthesis.  Orthopedics recommended WBAT, avoiding active abduction for 6 weeks.  Follow up with orthopedics outpatient in 2 weeks.  Hospitalization complicated by orthostatic hypotension, now resolved.   Hospital Course:  This is a80 y.o.femalewith [name]  history of hypertension, type 2 diabetes, hypothyroidismnow being admitted with:  #.Left periprosthetic hip fracture -Orthopedics has been consulted by the emergency department physician and recommends nonsurgical management with weightbearing as tolerated with Valori Hollenkamp walker and no active abduction for 6 weeks. -PT recommending home health, she declined this - Pain control  # Orthostatic Hypotension - resolved today.  Holding home BP meds.  She can follow home BP and if consistently over 140, can resume.  Otherwise follow with PCP for question of resumption.  # Sinus Bradycardia - she notes long history of this, doesn't seem to be symptomatic, follow closely with hypotension noted above - EKG with sinus brady - follow outpatient   #.H/o Diabetes - SSI - Heart healthy, carb controlled diet - a1c 5.8  #.History of hypothyroidism -Continue Synthroid  #. History ofhypertension - hold home ace/thiazide  Procedures: none  Consultations:  orthopedics  Discharge Exam: Vitals:   08/29/20 1059 08/29/20 1106  BP: (!) 126/59 (!) 164/63  Pulse: (!) 50 61  Resp: 18 18  Temp:    SpO2: 100% 100%   No new complaints Eager for discharge home  General: No acute distress. Cardiovascular: Heart sounds show Konner Warrior regular rate, and rhythm.  Lungs: Clear to auscultation bilaterally Abdomen: Soft, nontender, nondistended . Neurological: Alert and oriented 3. Moves all extremities 4. Cranial nerves II through XII grossly intact. Skin: Warm and dry. No rashes or lesions. Extremities: no sig ttp to LLE  Discharge Instructions   Discharge Instructions    Call MD for:  difficulty breathing, headache or visual disturbances   Complete by: As directed    Call MD for:  extreme fatigue   Complete by: As directed    Call MD for:  hives   Complete by:  As directed    Call MD for:  persistant dizziness or light-headedness   Complete by: As directed    Call MD for:  persistant  nausea and vomiting   Complete by: As directed    Call MD for:  redness, tenderness, or signs of infection (pain, swelling, redness, odor or green/yellow discharge around incision site)   Complete by: As directed    Call MD for:  severe uncontrolled pain   Complete by: As directed    Call MD for:  temperature >100.4   Complete by: As directed    Diet - low sodium heart healthy   Complete by: As directed    Discharge instructions   Complete by: As directed    You were seen for Jennfer Figueroa left undisplaced greater trochanter fracture.  Your imaging was reviewed by orthopedics who recommended weight bearing as tolerated with Halil Rentz walker and avoiding active abduction for 6 weeks.    Please follow up with Dr. Linna Caprice in 2 weeks for repeat radiographs.    You had an episode of low blood pressure yesterday (5/19) when you were working with PT.  We'll stop your blood pressure medicines at this time.  Please follow up with your PCP to follow up your blood pressures.  Your heart rate is slow, but this appears to be chronic.  Return for new, recurrent, or worsening symptoms.  Please ask your PCP to request records from this hospitalization so they know what was done and what the next steps will be.   Increase activity slowly   Complete by: As directed      Allergies as of 08/29/2020      Reactions   Penicillins Swelling   UNSPECIFIED SWELLING. Has patient had reaction causing immediate rash, facial/tongue/throat swelling, SOB or lightheadedness, hypotension: No Reaction causing severe rash involving mucus membranes or skin necrosis: No Has patient had [name] PCN reaction that required hospitalization No Has patient had [name] PCN reaction occurring within the last 10 years: No If all of the above answers are "NO", then may proceed with Cephalosporin use.   Oxycodone Other (See Comments)   Patient Preference  Makes her feel "weird" and she prefers not to take it      Medication List    STOP taking these  medications   lisinopril-hydrochlorothiazide 10-12.5 MG tablet Commonly known as: ZESTORETIC     TAKE these medications   acetaminophen 500 MG tablet Commonly known as: TYLENOL Take 500 mg by mouth every 8 (eight) hours as needed for moderate pain.   IMMUNE FORMULA PO Take 1 tablet by mouth daily.   levothyroxine 100 MCG tablet Commonly known as: SYNTHROID Take 100 mcg by mouth daily before breakfast.      Allergies  Allergen Reactions  . Penicillins Swelling    UNSPECIFIED SWELLING. Has patient had reaction causing immediate rash, facial/tongue/throat swelling, SOB or lightheadedness, hypotension: No Reaction causing severe rash involving mucus membranes or skin necrosis: No Has patient had Eugean Arnott PCN reaction that required hospitalization No Has patient had Genny Caulder PCN reaction occurring within the last 10 years: No If all of the above answers are "NO", then may proceed with Cephalosporin use.   Marland Kitchen Oxycodone Other (See Comments)    Patient Preference  Makes her feel "weird" and she prefers not to take it    Follow-up Information    Swinteck, Arlys John, MD. Schedule an appointment as soon as possible for Felisa Zechman visit in 2 week(s).   Specialty: Orthopedic Surgery Contact information: [address]  [address] [place] [address] [phone]                The results of significant diagnostics from this hospitalization (including imaging, microbiology, ancillary and laboratory) are listed below for reference.    Significant Diagnostic Studies: CT HIP LEFT WO CONTRAST  Result Date: 08/27/2020 CLINICAL DATA:  Recent fall with hip pain, initial encounter EXAM: CT OF THE LEFT HIP WITHOUT CONTRAST TECHNIQUE: Multidetector CT imaging of the left hip was performed according to the standard protocol. Multiplanar CT image reconstructions were also generated. COMPARISON:  None. FINDINGS: Bones/Joint/Cartilage Degenerative changes of the left sacroiliac joint are seen. Left hip  prosthesis is seen. Periprosthetic fracture is noted in the region of the greater trochanter. This is best seen on the sagittal and coronal reconstructions. No other definitive fracture is seen. Ligaments Suboptimally assessed by CT. Muscles and Tendons Musculature demonstrates some fatty infiltration consistent with decreased use. Soft tissues Surrounding soft tissue structures demonstrate some subcutaneous edema related to the recent injury. No definitive joint effusion is seen. IMPRESSION: Undisplaced greater trochanter fractures surrounding the known hip prosthesis. No other focal abnormality is seen. Electronically Signed   By: Alcide CleverMark  Lukens M.D.   On: 08/27/2020 20:39   Chest Portable 1 View  Result Date: 08/27/2020 CLINICAL DATA:  Preop evaluation EXAM: PORTABLE CHEST 1 VIEW COMPARISON:  None. FINDINGS: Cardiac shadow is at the upper limits of normal in size. Aortic calcifications are noted. No acute infiltrate or sizable effusion is seen. Postsurgical changes in the right shoulder are noted. Degenerative changes in the left shoulder are seen. IMPRESSION: No acute abnormality noted. Electronically Signed   By: Alcide CleverMark  Lukens M.D.   On: 08/27/2020 22:56    Microbiology: Recent Results (from the past 240 hour(s))  Resp Panel by RT-PCR (Flu Kohei Antonellis&B, Covid) Nasopharyngeal Swab     Status: None   Collection Time: 08/27/20  6:57 PM   Specimen: Nasopharyngeal Swab; Nasopharyngeal(NP) swabs in vial transport medium  Result Value Ref Range Status   SARS Coronavirus 2 by RT PCR NEGATIVE NEGATIVE Final    Comment: (NOTE) SARS-CoV-2 target nucleic acids are NOT DETECTED.  The SARS-CoV-2 RNA is generally detectable in upper respiratory specimens during the acute phase of infection. The lowest concentration of SARS-CoV-2 viral copies this assay can detect is 138 copies/mL. Kelley Polinsky negative result does not preclude SARS-Cov-2 infection and should not be used as the sole basis for treatment or other patient  management decisions. Jammie Clink negative result may occur with  improper specimen collection/handling, submission of specimen other than nasopharyngeal swab, presence of viral mutation(s) within the areas targeted by this assay, and inadequate number of viral copies(<138 copies/mL). Stormey Wilborn negative result must be combined with clinical observations, patient history, and epidemiological information. The expected result is Negative.  Fact Sheet for Patients:  BloggerCourse.comhttps://www.fda.gov/media/152166/download  Fact Sheet for Healthcare Providers:  SeriousBroker.ithttps://www.fda.gov/media/152162/download  This test is no t yet approved or cleared by the [place] FDA and  has been authorized for detection and/or diagnosis of SARS-CoV-2 by FDA under an Emergency Use Authorization (EUA). This EUA will remain  in effect (meaning this test can be used) for the duration of the COVID-19 declaration under Section 564(b)(1) of the Act, 21 U.S.C.section 360bbb-3(b)(1), unless the authorization is terminated  or revoked sooner.       Influenza Tiawanna Luchsinger by PCR NEGATIVE NEGATIVE Final   Influenza B by PCR NEGATIVE NEGATIVE Final    Comment: (NOTE) The Xpert Xpress SARS-CoV-2/FLU/RSV plus assay is intended  as an aid in the diagnosis of influenza from Nasopharyngeal swab specimens and should not be used as Moataz Tavis sole basis for treatment. Nasal washings and aspirates are unacceptable for Xpert Xpress SARS-CoV-2/FLU/RSV testing.  Fact Sheet for Patients: BloggerCourse.com  Fact Sheet for Healthcare Providers: SeriousBroker.it  This test is not yet approved or cleared by the [place] FDA and has been authorized for detection and/or diagnosis of SARS-CoV-2 by FDA under an Emergency Use Authorization (EUA). This EUA will remain in effect (meaning this test can be used) for the duration of the COVID-19 declaration under Section 564(b)(1) of the Act, 21 U.S.C. section 360bbb-3(b)(1),  unless the authorization is terminated or revoked.  Performed at [facility], [address] [place], [place] [address]      Labs: Basic Metabolic Panel: Recent Labs  Lab 08/27/20 1856 08/27/20 2257 08/28/20 0738 08/29/20 0314  NA 135  --  135 134*  K 4.0  --  4.0 4.4  CL 97*  --  100 99  CO2 27  --  27 30  GLUCOSE 98  --  102* 102*  BUN 20  --  19 19  CREATININE 0.75 0.65 0.76 0.76  CALCIUM 9.8  --  9.2 8.6*  MG  --   --   --  2.0  PHOS  --   --   --  4.2   Liver Function Tests: Recent Labs  Lab 08/28/20 0738 08/29/20 0314  AST 24 19  ALT 15 12  ALKPHOS 102 102  BILITOT 0.7 0.4  PROT 6.8 6.3*  ALBUMIN 3.8 3.4*   No results for input(s): LIPASE, AMYLASE in the last 168 hours. No results for input(s): AMMONIA in the last 168 hours. CBC: Recent Labs  Lab 08/27/20 1856 08/27/20 2257 08/28/20 0738 08/29/20 0314  WBC 8.1 6.7 6.1 4.6  NEUTROABS  --   --  4.0 2.3  HGB 13.1 12.0 11.7* 10.4*  HCT 38.8 35.9* 35.1* 31.4*  MCV 86.6 86.5 86.5 87.0  PLT 175 160 163 127*   Cardiac Enzymes: No results for input(s): CKTOTAL, CKMB, CKMBINDEX, TROPONINI in the last 168 hours. BNP: BNP (last 3 results) No results for input(s): BNP in the last 8760 hours.  ProBNP (last 3 results) No results for input(s): PROBNP in the last 8760 hours.  CBG: Recent Labs  Lab 08/28/20 0731 08/28/20 1211 08/28/20 1642 08/28/20 2118 08/29/20 0752  GLUCAP 103* 93 121* 138* 98       Signed:  Lacretia Nicks MD.  [facility] 08/29/2020, 11:07 AM

## 2020-08-29 NOTE — TOC Transition Note (Signed)
Transition of Care [facility]) - CM/SW Discharge Note   Patient Details  Name: [name] MRN: [id number] Date of Birth: 1940/08/13  [facility] of [facility]) CM/SW Contact:  Lennart Pall, LCSW Phone Number: 08/29/2020, 10:35 AM   Clinical Narrative:    Met with pt this morning who is eager for dc home. Discussed PT recommendation for HHPT, however, pt says she does not want this and plans to do her exercises on her own. No DME needs.  No further TOC needs.   Final next level of care: Home/Self Care Barriers to Discharge: Barriers Resolved   Patient Goals and CMS Choice Patient states their goals for this hospitalization and ongoing recovery are:: return home      Discharge Placement                       Discharge Plan and Services In-house Referral: Clinical Social Work              DME Arranged: N/A DME Agency: NA                  Social Determinants of Health (SDOH) Interventions     Readmission Risk Interventions Readmission Risk Prevention Plan 08/28/2020  Post Dischage Appt Complete  Medication Screening Complete  Transportation Screening Complete  Some recent data might be hidden

## 2020-09-09 DIAGNOSIS — M25552 Pain in left hip: Secondary | ICD-10-CM | POA: Diagnosis not present

## 2020-10-07 DIAGNOSIS — M79605 Pain in left leg: Secondary | ICD-10-CM | POA: Diagnosis not present

## 2020-10-14 DIAGNOSIS — S72115D Nondisplaced fracture of greater trochanter of left femur, subsequent encounter for closed fracture with routine healing: Secondary | ICD-10-CM | POA: Diagnosis not present

## 2020-10-22 DIAGNOSIS — E039 Hypothyroidism, unspecified: Secondary | ICD-10-CM | POA: Diagnosis not present

## 2020-10-22 DIAGNOSIS — I10 Essential (primary) hypertension: Secondary | ICD-10-CM | POA: Diagnosis not present

## 2020-10-22 DIAGNOSIS — R7303 Prediabetes: Secondary | ICD-10-CM | POA: Diagnosis not present

## 2020-10-22 DIAGNOSIS — R609 Edema, unspecified: Secondary | ICD-10-CM | POA: Diagnosis not present

## 2020-10-22 DIAGNOSIS — Z6825 Body mass index (BMI) 25.0-25.9, adult: Secondary | ICD-10-CM | POA: Diagnosis not present

## 2020-11-27 DIAGNOSIS — H52203 Unspecified astigmatism, bilateral: Secondary | ICD-10-CM | POA: Diagnosis not present

## 2020-11-27 DIAGNOSIS — E119 Type 2 diabetes mellitus without complications: Secondary | ICD-10-CM | POA: Diagnosis not present

## 2020-12-01 DIAGNOSIS — S72115D Nondisplaced fracture of greater trochanter of left femur, subsequent encounter for closed fracture with routine healing: Secondary | ICD-10-CM | POA: Diagnosis not present

## 2020-12-01 DIAGNOSIS — M25552 Pain in left hip: Secondary | ICD-10-CM | POA: Diagnosis not present

## 2020-12-09 DIAGNOSIS — Z96652 Presence of left artificial knee joint: Secondary | ICD-10-CM | POA: Diagnosis not present

## 2020-12-09 DIAGNOSIS — M25562 Pain in left knee: Secondary | ICD-10-CM | POA: Diagnosis not present

## 2020-12-09 DIAGNOSIS — G609 Hereditary and idiopathic neuropathy, unspecified: Secondary | ICD-10-CM | POA: Diagnosis not present

## 2020-12-29 DIAGNOSIS — M545 Low back pain, unspecified: Secondary | ICD-10-CM | POA: Diagnosis not present

## 2020-12-29 DIAGNOSIS — M7542 Impingement syndrome of left shoulder: Secondary | ICD-10-CM | POA: Diagnosis not present

## 2021-03-09 DIAGNOSIS — M19012 Primary osteoarthritis, left shoulder: Secondary | ICD-10-CM | POA: Diagnosis not present

## 2021-04-07 DIAGNOSIS — M19012 Primary osteoarthritis, left shoulder: Secondary | ICD-10-CM | POA: Diagnosis not present

## 2021-04-07 DIAGNOSIS — M25512 Pain in left shoulder: Secondary | ICD-10-CM | POA: Diagnosis not present

## 2021-04-23 DIAGNOSIS — E039 Hypothyroidism, unspecified: Secondary | ICD-10-CM | POA: Diagnosis not present

## 2021-04-23 DIAGNOSIS — R001 Bradycardia, unspecified: Secondary | ICD-10-CM | POA: Diagnosis not present

## 2021-04-23 DIAGNOSIS — I10 Essential (primary) hypertension: Secondary | ICD-10-CM | POA: Diagnosis not present

## 2021-04-23 DIAGNOSIS — Z1331 Encounter for screening for depression: Secondary | ICD-10-CM | POA: Diagnosis not present

## 2021-04-23 DIAGNOSIS — Z6826 Body mass index (BMI) 26.0-26.9, adult: Secondary | ICD-10-CM | POA: Diagnosis not present

## 2021-04-23 DIAGNOSIS — M19012 Primary osteoarthritis, left shoulder: Secondary | ICD-10-CM | POA: Diagnosis not present

## 2021-04-23 DIAGNOSIS — Z01818 Encounter for other preprocedural examination: Secondary | ICD-10-CM | POA: Diagnosis not present

## 2021-04-23 DIAGNOSIS — R609 Edema, unspecified: Secondary | ICD-10-CM | POA: Diagnosis not present

## 2021-04-23 DIAGNOSIS — R7303 Prediabetes: Secondary | ICD-10-CM | POA: Diagnosis not present

## 2021-05-07 NOTE — Progress Notes (Signed)
Pt. Need orders for upcoming surgery. °

## 2021-05-07 NOTE — Patient Instructions (Addendum)
DUE TO COVID-19 ONLY ONE VISITOR IS ALLOWED TO COME WITH YOU AND STAY IN THE WAITING ROOM ONLY DURING PRE OP AND PROCEDURE.   **NO VISITORS ARE ALLOWED IN THE SHORT STAY AREA OR RECOVERY ROOM!!**  IF YOU WILL BE ADMITTED INTO THE HOSPITAL YOU ARE ALLOWED ONLY TWO SUPPORT PEOPLE DURING VISITATION HOURS ONLY (7 AM -8PM)   The support person(s) must pass our screening, gel in and out, and wear a mask at all times, including in the patients room. Patients must also wear a mask when staff or their support person are in the room. Visitors GUEST BADGE MUST BE WORN VISIBLY  One adult visitor may remain with you overnight and MUST be in the room by 8 P.M.  No visitors under the age of 79. Any visitor under the age of 10 must be accompanied by an adult.     Your procedure is scheduled on: 05/29/21   Report to [facility] Main [facility]ance    Report to short stay at: 5:15 AM   Call this number if you have problems the morning of surgery [phone]   Do not eat food :After Midnight.   May have liquids until : 4:15 AM   day of surgery  CLEAR LIQUID DIET  Foods Allowed                                                                     Foods Excluded  Water, Black Coffee and tea, regular and decaf                             liquids that you cannot  Plain Jell-O in any flavor  (No red)                                           see through such as: Fruit ices (not with fruit pulp)                                     milk, soups, orange juice              Iced Popsicles (No red)                                    All solid food                                   Apple juices Sports drinks like Gatorade (No red) Lightly seasoned clear broth or consume(fat free) Sugar Sample Menu Breakfast                                Lunch  Supper Cranberry juice                    Beef broth                            Chicken broth Jell-O                                      Grape juice                           Apple juice Coffee or tea                        Jell-O                                      Popsicle                                                Coffee or tea                        Coffee or tea   Oral Hygiene is also important to reduce your risk of infection.                                    Remember - BRUSH YOUR TEETH THE MORNING OF SURGERY WITH YOUR REGULAR TOOTHPASTE   Do NOT smoke after Midnight   Take these medicines the morning of surgery with A SIP OF WATER: synthroid  DO NOT TAKE ANY ORAL DIABETIC MEDICATIONS DAY OF YOUR SURGERY                              You may not have any metal on your body including hair pins, jewelry, and body piercing             Do not wear make-up, lotions, powders, perfumes/cologne, or deodorant  Do not wear nail polish including gel and S&S, artificial/acrylic nails, or any other type of covering on natural nails including finger and toenails. If you have artificial nails, gel coating, etc. that needs to be removed by a nail salon please have this removed prior to surgery or surgery may need to be canceled/ delayed if the surgeon/ anesthesia feels like they are unable to be safely monitored.   Do not shave  48 hours prior to surgery.    Do not bring valuables to the hospital. [facility] IS NOT             RESPONSIBLE   FOR VALUABLES.   Contacts, dentures or bridgework may not be worn into surgery.   Bring small overnight bag day of surgery.    Patients discharged on the day of surgery will not be allowed to drive home.  Someone needs to stay with you for the first 24 hours after anesthesia.   Special Instructions: Bring a copy of your healthcare power of attorney  and living will documents         the day of surgery if you haven't scanned them before.              Please read over the following fact sheets you were given: IF YOU HAVE QUESTIONS ABOUT YOUR PRE-OP INSTRUCTIONS PLEASE CALL  [phone]     [facility] - Preparing for Surgery Before surgery, you can play an important role.  Because skin is not sterile, your skin needs to be as free of germs as possible.  You can reduce the number of germs on your skin by washing with CHG (chlorahexidine gluconate) soap before surgery.  CHG is an antiseptic cleaner which kills germs and bonds with the skin to continue killing germs even after washing. Please DO NOT use if you have an allergy to CHG or antibacterial soaps.  If your skin becomes reddened/irritated stop using the CHG and inform your nurse when you arrive at Short Stay. Do not shave (including legs and underarms) for at least 48 hours prior to the first CHG shower.  You may shave your face/neck. Please follow these instructions carefully:  1.  Shower with CHG Soap the night before surgery and the  morning of Surgery.  2.  If you choose to wash your hair, wash your hair first as usual with your  normal  shampoo.  3.  After you shampoo, rinse your hair and body thoroughly to remove the  shampoo.                           4.  Use CHG as you would any other liquid soap.  You can apply chg directly  to the skin and wash                       Gently with a scrungie or clean washcloth.  5.  Apply the CHG Soap to your body ONLY FROM THE NECK DOWN.   Do not use on face/ open                           Wound or open sores. Avoid contact with eyes, ears mouth and genitals (private parts).                       Wash face,  Genitals (private parts) with your normal soap.             6.  Wash thoroughly, paying special attention to the area where your surgery  will be performed.  7.  Thoroughly rinse your body with warm water from the neck down.  8.  DO NOT shower/wash with your normal soap after using and rinsing off  the CHG Soap.                9.  Pat yourself dry with a clean towel.            10.  Wear clean pajamas.            11.  Place clean sheets on your bed the night of your  first shower and do not  sleep with pets. Day of Surgery : Do not apply any lotions/deodorants the morning of surgery.  Please wear clean clothes to the hospital/surgery center.  FAILURE TO FOLLOW THESE INSTRUCTIONS MAY RESULT IN THE CANCELLATION OF YOUR SURGERY PATIENT SIGNATURE_________________________________  NURSE  SIGNATURE__________________________________  ________________________________________________________________________ [facility]- [facility]    Before surgery, you can play an important role. Because skin is not sterile, your skin needs to be as free of germs as possible. You can reduce the number of germs on your skin by using the following products. Benzoyl Peroxide Gel Reduces the number of germs present on the skin Applied twice a day to shoulder area starting two days before surgery    ==================================================================  Please follow these instructions carefully:  BENZOYL PEROXIDE 5% GEL  Please do not use if you have an allergy to benzoyl peroxide.   If your skin becomes reddened/irritated stop using the benzoyl peroxide.  Starting two days before surgery, apply as follows: Apply benzoyl peroxide in the morning and at night. Apply after taking a shower. If you are not taking a shower clean entire shoulder front, back, and side along with the armpit with a clean wet washcloth.  Place a quarter-sized dollop on your shoulder and rub in thoroughly, making sure to cover the front, back, and side of your shoulder, along with the armpit.   2 days before ____ AM   ____ PM              1 day before ____ AM   ____ PM                         Do this twice a day for two days.  (Last application is the night before surgery, AFTER using the CHG soap as described below).  Do NOT apply benzoyl peroxide gel on the day of surgery.

## 2021-05-08 ENCOUNTER — Encounter (HOSPITAL_COMMUNITY): Payer: Self-pay

## 2021-05-08 ENCOUNTER — Other Ambulatory Visit: Payer: Self-pay

## 2021-05-08 ENCOUNTER — Encounter (HOSPITAL_COMMUNITY)
Admission: RE | Admit: 2021-05-08 | Discharge: 2021-05-08 | Disposition: A | Discharge status: Home / Self Care | Payer: PPO | Source: Ambulatory Visit | Attending: Orthopedic Surgery | Admitting: Orthopedic Surgery

## 2021-05-08 VITALS — BP 150/37 | HR 44 | Temp 97.9°F | Resp 18 | Ht 64.0 in | Wt 137.5 lb

## 2021-05-08 DIAGNOSIS — Z01818 Encounter for other preprocedural examination: Secondary | ICD-10-CM

## 2021-05-08 DIAGNOSIS — E119 Type 2 diabetes mellitus without complications: Secondary | ICD-10-CM | POA: Insufficient documentation

## 2021-05-08 DIAGNOSIS — Z01812 Encounter for preprocedural laboratory examination: Secondary | ICD-10-CM | POA: Diagnosis not present

## 2021-05-08 DIAGNOSIS — I10 Essential (primary) hypertension: Secondary | ICD-10-CM | POA: Diagnosis not present

## 2021-05-08 LAB — CBC
HCT: 36.7 % (ref 36.0–46.0)
Hemoglobin: 12.2 g/dL (ref 12.0–15.0)
MCH: 29.7 pg (ref 26.0–34.0)
MCHC: 33.2 g/dL (ref 30.0–36.0)
MCV: 89.3 fL (ref 80.0–100.0)
Platelets: 197 10*3/uL (ref 150–400)
RBC: 4.11 MIL/uL (ref 3.87–5.11)
RDW: 13.1 % (ref 11.5–15.5)
WBC: 6.1 10*3/uL (ref 4.0–10.5)
nRBC: 0 % (ref 0.0–0.2)

## 2021-05-08 LAB — GLUCOSE, CAPILLARY: Glucose-Capillary: 96 mg/dL (ref 70–99)

## 2021-05-08 LAB — BASIC METABOLIC PANEL
Anion gap: 8 (ref 5–15)
BUN: 20 mg/dL (ref 8–23)
CO2: 27 mmol/L (ref 22–32)
Calcium: 9.1 mg/dL (ref 8.9–10.3)
Chloride: 98 mmol/L (ref 98–111)
Creatinine, Ser: 0.74 mg/dL (ref 0.44–1.00)
GFR, Estimated: >60 mL/min (ref >60)
Glucose, Bld: 104 mg/dL — ABNORMAL HIGH (ref 70–99)
Potassium: 4.2 mmol/L (ref 3.5–5.1)
Sodium: 133 mmol/L — ABNORMAL LOW (ref 135–145)

## 2021-05-08 LAB — SURGICAL PCR SCREEN
MRSA, PCR: NEGATIVE
Staphylococcus aureus: NEGATIVE

## 2021-05-08 LAB — HEMOGLOBIN A1C
Hgb A1c MFr Bld: 5.2 % (ref 4.8–5.6)
Mean Plasma Glucose: 102.54 mg/dL

## 2021-05-08 NOTE — Progress Notes (Addendum)
COVID Vaccine Completed: NO. Pt. Got the infusion. Date COVID Vaccine completed: COVID vaccine manufacturer: Pfizer    [employer]  COVID Test: N/A PCP - Lonie Peak: PAC : Clearance: 04/23/21: Chart. Cardiologist -   Chest x-ray -  EKG - 04/22/21 : Chart Stress Test -  ECHO -  Cardiac Cath -  Pacemaker/ICD device last checked:  Sleep Study -  CPAP -   Fasting Blood Sugar - N/A Checks Blood Sugar ___0__ times a day  Blood Thinner Instructions: Aspirin Instructions: Last Dose:  Anesthesia review: Hx: HTN,DIA  Patient denies shortness of breath, fever, cough and chest pain at PAT appointment   Patient verbalized understanding of instructions that were given to them at the PAT appointment. Patient was also instructed that they will need to review over the PAT instructions again at home before surgery.

## 2021-05-12 DIAGNOSIS — M19012 Primary osteoarthritis, left shoulder: Secondary | ICD-10-CM | POA: Diagnosis not present

## 2021-05-12 DIAGNOSIS — M25551 Pain in right hip: Secondary | ICD-10-CM | POA: Diagnosis not present

## 2021-05-13 ENCOUNTER — Encounter (HOSPITAL_COMMUNITY): Payer: Self-pay | Admitting: Physician Assistant

## 2021-05-14 NOTE — H&P (Signed)
Patient's anticipated LOS is less than 2 midnights, meeting these requirements: - Younger than 11 - Lives within 1 hour of care - Has a competent adult at home to recover with post-op recover - NO history of  - Chronic pain requiring opiods  - Diabetes  - Coronary Artery Disease  - Heart failure  - Heart attack  - Stroke  - DVT/VTE  - Cardiac arrhythmia  - Respiratory Failure/COPD  - Renal failure  - Anemia  - Advanced Liver disease     [name] is an 81 y.o. female.    Chief Complaint: left shoulder pain  HPI: Pt is a 81 y.o. female complaining of left shoulder pain for multiple years. Pain had continually increased since the beginning. X-rays in the clinic show end-stage arthritic changes of the left shoulder. Pt has tried various conservative treatments which have failed to alleviate their symptoms, including injections and therapy. Various options are discussed with the patient. Risks, benefits and expectations were discussed with the patient. Patient understand the risks, benefits and expectations and wishes to proceed with surgery.   PCP:  Cyndi Bender, PA-C  D/C Plans: Home  PMH: Past Medical History:  Diagnosis Date   Diabetes mellitus without complication ([place])    hx of 2006 no longer on meds, lost weight 40lbs, no longer a diabetic per patient,   Hearing loss    wears hearing aids   Hypertension    Hypothyroidism    Thyroid disease    Wears dentures     PSH: Past Surgical History:  Procedure Laterality Date   ANTERIOR AND POSTERIOR REPAIR WITH SACROSPINOUS FIXATION N/A 08/17/2016   Procedure: ANTERIOR AND POSTERIOR REPAIR;  Surgeon: Everlene Farrier, MD;  Location: [facility];  Service: Gynecology;  Laterality: N/A;   APPENDECTOMY     bilateral cataract surgery      CHOLECYSTECTOMY     HAND SURGERY Right    [facility]   HAND SURGERY Left    dr Amedeo Plenty   LAPAROSCOPIC VAGINAL HYSTERECTOMY WITH SALPINGO OOPHORECTOMY Bilateral 08/17/2016   Procedure:  LAPAROSCOPIC ASSISTED VAGINAL HYSTERECTOMY WITH SALPINGO OOPHORECTOMY;  Surgeon: Everlene Farrier, MD;  Location: [facility];  Service: Gynecology;  Laterality: Bilateral;   LUMBAR LAMINECTOMY/DECOMPRESSION MICRODISCECTOMY N/A 04/24/2020   Procedure: Microlumbar decompression Lumbar Four-Five Lumbar Three-Four lateral mass fusion with autologous/allograft bone;  Surgeon: Susa Day, MD;  Location: [facility];  Service: Orthopedics;  Laterality: N/A;  posterior   REVERSE SHOULDER ARTHROPLASTY Right 09/03/2015   Procedure: RIGHT REVERSE SHOULDER ARTHROPLASTY;  Surgeon: Netta Cedars, MD;  Location: [facility];  Service: Orthopedics;  Laterality: Right;   THYROIDECTOMY     TONSILLECTOMY     TOTAL HIP ARTHROPLASTY     bilateral   TOTAL KNEE ARTHROPLASTY Left 03/28/2015   Procedure: LEFT TOTAL KNEE ARTHROPLASTY;  Surgeon: Sydnee Cabal, MD;  Location: [facility];  Service: Orthopedics;  Laterality: Left;   TUBAL LIGATION     URETHRAL DILATION      Social History:  reports that she has never smoked. She has never used smokeless tobacco. She reports that she does not drink alcohol and does not use drugs.  Allergies:  Allergies  Allergen Reactions   Penicillins Swelling    UNSPECIFIED SWELLING. Has patient had reaction causing immediate rash, facial/tongue/throat swelling, SOB or lightheadedness, hypotension: No Reaction causing severe rash involving mucus membranes or skin necrosis: No Has patient had a PCN reaction that required hospitalization No Has patient had a PCN reaction occurring within the last 10 years:  No If all of the above answers are "NO", then may proceed with Cephalosporin use.    Oxycodone Other (See Comments)    Patient Preference  Makes her feel "weird" and she prefers not to take it    Medications: No current facility-administered medications for this encounter.   Current Outpatient Medications  Medication Sig Dispense Refill   acetaminophen (TYLENOL) 500 MG tablet Take 500 mg by  mouth every 8 (eight) hours as needed for moderate pain.     furosemide (LASIX) 20 MG tablet Take 20 mg by mouth daily as needed (swelling/fluid retention.).     levothyroxine (SYNTHROID, LEVOTHROID) 100 MCG tablet Take 100 mcg by mouth daily before breakfast.      lisinopril (ZESTRIL) 10 MG tablet Take 10 mg by mouth daily as needed (elevated blood pressure.).     Misc Natural Products (IMMUNE FORMULA PO) Take 1 tablet by mouth in the morning. Immune 24 hour      No results found for this or any previous visit (from the past 48 hour(s)). No results found.  ROS: Pain with rom of the left upper extremity  Physical Exam: Alert and oriented 81 y.o. female in no acute distress Cranial nerves 2-12 intact Cervical spine: full rom with no tenderness, nv intact distally Chest: active breath sounds bilaterally, no wheeze rhonchi or rales Heart: regular rate and rhythm, no murmur Abd: non tender non distended with active bowel sounds Hip is stable with rom  Left shoulder with weak and painful rom Nv intact distally  Assessment/Plan Assessment: left shoulder cuff arthropathy  Plan:  Patient will undergo a left reverse shoulder by Dr. Veverly Fells at [place] Risks benefits and expectations were discussed with the patient. Patient understand risks, benefits and expectations and wishes to proceed. Preoperative templating of the joint replacement has been completed, documented, and submitted to the Operating Room personnel in order to optimize intra-operative equipment management.   Merla Riches PA-C, MPAS [facility] is now [employer] [address] [place], [place], [place] [address] Phone: [phone] [email or web address]   [employer]

## 2021-05-25 DIAGNOSIS — U071 COVID-19: Secondary | ICD-10-CM | POA: Diagnosis not present

## 2021-05-29 ENCOUNTER — Ambulatory Visit (HOSPITAL_COMMUNITY): Admission: RE | Admit: 2021-05-29 | Payer: PPO | Source: Home / Self Care | Admitting: Orthopedic Surgery

## 2021-05-29 ENCOUNTER — Encounter (HOSPITAL_COMMUNITY): Admission: RE | Payer: Self-pay | Source: Home / Self Care

## 2021-05-29 SURGERY — ARTHROPLASTY, SHOULDER, TOTAL, REVERSE
Anesthesia: General | Site: Shoulder | Laterality: Left

## 2021-06-19 NOTE — Progress Notes (Signed)
Sent message, via epic in basket, requesting orders in epic from surgeon.  

## 2021-06-25 NOTE — Progress Notes (Addendum)
COVID swab appointment:  N/A ? ?COVID Vaccine Completed:  No ?Date COVID Vaccine completed: ?Has received booster: ?COVID vaccine manufacturer: [employer]  ? ?Date of COVID positive in last 90 days :No ? ?PCP - Lonie Peak, PA-C ?Cardiologist - N/A ? ?Medical clearance on chart dated 04-23-21 by Lonie Peak ? ?Chest x-ray - 08-27-20 Epic ?EKG - 08-29-20 Epic ?Stress Test - N/A ?ECHO - N/A ?Cardiac Cath - N/A ?Pacemaker/ICD device last checked: ?Spinal Cord Stimulator: ? ?Bowel Prep - N/A ? ?Sleep Study - N/A ?CPAP -  ? ?Fasting Blood Sugar -  ?Checks Blood Sugar - Does not check  ? ?Blood Thinner Instructions: ?Aspirin Instructions: ?Last Dose: ? ?Activity level:   Can go up a flight of stairs and perform activities of daily living without stopping and without symptoms of chest pain or shortness of breath. Patient lives alone ?  ?Anesthesia review:  N/A ? ?Patient denies shortness of breath, fever, cough and chest pain at PAT appointment ? ? ?Patient verbalized understanding of instructions that were given to them at the PAT appointment. Patient was also instructed that they will need to review over the PAT instructions again at home before surgery.  ?

## 2021-06-25 NOTE — Patient Instructions (Addendum)
DUE TO COVID-19 ONLY ONE VISITOR  (aged 83 and older)  IS ALLOWED TO COME WITH YOU AND STAY IN THE WAITING ROOM ONLY DURING PRE OP AND PROCEDURE.   ?**NO VISITORS ARE ALLOWED IN THE SHORT STAY AREA OR RECOVERY ROOM!!** ? ?You are not required to quarantine, however you are required to wear a well-fitted mask when you are out and around people not in your household.  ?Hand Hygiene often ?Do NOT share personal items ?Notify your provider if you are in close contact with someone who has COVID or you develop fever 100.4 or greater, new onset of sneezing, cough, sore throat, shortness of breath or body aches. ?     ? Your procedure is scheduled on: Friday, 07-10-21 ? ? Report to [facility] Main Entrance ? ?  Report to admitting at 1:00 PM ? ? Call this number if you have problems the morning of surgery [phone] ? ? Do not eat food :After Midnight. ? ? After Midnight you may have the following liquids until 12:45 PM DAY OF SURGERY ? ?Water ?Black Coffee (sugar ok, NO MILK/CREAM OR CREAMERS)  ?Tea (sugar ok, NO MILK/CREAM OR CREAMERS) regular and decaf                             ?Plain Jell-O (NO RED)                                           ?Fruit ices (not with fruit pulp, NO RED)                                     ?Popsicles (NO RED)                                                                  ?Juice: apple, WHITE grape, WHITE cranberry ?Sports drinks like Gatorade (NO RED) ?Clear broth(vegetable,chicken,beef) ? ?             ?Drink 1 G2 drink AT 12:45 PM the day of surgery. ? ? ?  ?The day of surgery:  ?Drink ONE (1) Pre-Surgery G2 at 12:45 PM the morning of surgery. Drink in one sitting. Do not sip.  ?This drink was given to you during your hospital  ?pre-op appointment visit. ?Nothing else to drink after completing the Pre-Surgery G2. ?  ?       If you have questions, please contact your surgeon?s office. ? ? ?FOLLOW  ANY ADDITIONAL PRE OP INSTRUCTIONS YOU RECEIVED FROM YOUR SURGEON'S OFFICE!!! ?   ?  ?Oral Hygiene is also important to reduce your risk of infection.                                    ?Remember - BRUSH YOUR TEETH THE MORNING OF SURGERY WITH YOUR REGULAR TOOTHPASTE ? ? Do NOT smoke after Midnight ? ? Take these medicines the morning of surgery with A SIP OF WATER: Tylenol, Levothyroxine ? ?  DO NOT TAKE ANY ORAL DIABETIC MEDICATIONS DAY OF YOUR SURGERY ?                  ?           You may not have any metal on your body including hair pins, jewelry, and body piercing ? ?           Do not wear make-up, lotions, powders, perfumes or deodorant ? ?Do not wear nail polish including gel and S&S, artificial/acrylic nails, or any other type of covering on natural nails including finger and toenails. If you have artificial nails, gel coating, etc. that needs to be removed by a nail salon please have this removed prior to surgery or surgery may need to be canceled/ delayed if the surgeon/ anesthesia feels like they are unable to be safely monitored.  ? ?Do not shave  48 hours prior to surgery.  ? ? Do not bring valuables to the hospital. [facility] IS NOT RESPONSIBLE   FOR VALUABLES. ? ? Contacts, dentures or bridgework may not be worn into surgery. ? ?Patients discharged on the day of surgery will not be allowed to drive home.  Someone NEEDS to stay with you for the first 24 hours after anesthesia. ? ?Please read over the following fact sheets you were given: IF YOU HAVE QUESTIONS ABOUT YOUR PRE-OP INSTRUCTIONS PLEASE CALL [phone] [facility]  ? ?[facility] - Preparing for Surgery ?Before surgery, you can play an important role.  Because skin is not sterile, your skin needs to be as free of germs as possible.  You can reduce the number of germs on your skin by washing with CHG (chlorahexidine gluconate) soap before surgery.  CHG is an antiseptic cleaner which kills germs and bonds with the skin to continue killing germs even after washing. ?Please DO NOT use if you have an allergy to CHG or  antibacterial soaps.  If your skin becomes reddened/irritated stop using the CHG and inform your nurse when you arrive at Short Stay. ?Do not shave (including legs and underarms) for at least 48 hours prior to the first CHG shower.  You may shave your face/neck. ? ?Please follow these instructions carefully: ? 1.  Shower with CHG Soap the night before surgery and the  morning of surgery. ? 2.  If you choose to wash your hair, wash your hair first as usual with your normal  shampoo. ? 3.  After you shampoo, rinse your hair and body thoroughly to remove the shampoo.                            ? 4.  Use CHG as you would any other liquid soap.  You can apply chg directly to the skin and wash.  Gently with a scrungie or clean washcloth. ? 5.  Apply the CHG Soap to your body ONLY FROM THE NECK DOWN.   Do   not use on face/ open      ?                     Wound or open sores. Avoid contact with eyes, ears mouth and   genitals (private parts).  ?                     Engineering geologistWash face,  Genitals (private parts) with your normal soap. ?  6.  Wash thoroughly, paying special attention to the area where your    surgery  will be performed. ? 7.  Thoroughly rinse your body with warm water from the neck down. ? 8.  DO NOT shower/wash with your normal soap after using and rinsing off the CHG Soap. ?               9.  Pat yourself dry with a clean towel. ?           10.  Wear clean pajamas. ?           11.  Place clean sheets on your bed the night of your first shower and do not  sleep with pets. ?Day of Surgery : ?Do not apply any lotions/deodorants the morning of surgery.  Please wear clean clothes to the hospital/surgery center. ? ?FAILURE TO FOLLOW THESE INSTRUCTIONS MAY RESULT IN THE CANCELLATION OF YOUR SURGERY ? ?PATIENT SIGNATURE_________________________________ ? ?NURSE SIGNATURE__________________________________ ? ?________________________________________________________________________  ?  ? ?Incentive Spirometer ? ?An  incentive spirometer is a tool that can help keep your lungs clear and active. This tool measures how well you are filling your lungs with each breath. Taking long deep breaths may help reverse or decrease the chance of developing breathing (pulmonary) problems (especially infection) following: ?A long period of time when you are unable to move or be active. ?BEFORE THE PROCEDURE  ?If the spirometer includes an indicator to show your best effort, your nurse or respiratory therapist will set it to a desired goal. ?If possible, sit up straight or lean slightly forward. Try not to slouch. ?Hold the incentive spirometer in an upright position. ?INSTRUCTIONS FOR USE  ?Sit on the edge of your bed if possible, or sit up as far as you can in bed or on a chair. ?Hold the incentive spirometer in an upright position. ?Breathe out normally. ?Place the mouthpiece in your mouth and seal your lips tightly around it. ?Breathe in slowly and as deeply as possible, raising the piston or the ball toward the top of the column. ?Hold your breath for 3-5 seconds or for as long as possible. Allow the piston or ball to fall to the bottom of the column. ?Remove the mouthpiece from your mouth and breathe out normally. ?Rest for a few seconds and repeat Steps 1 through 7 at least 10 times every 1-2 hours when you are awake. Take your time and take a few normal breaths between deep breaths. ?The spirometer may include an indicator to show your best effort. Use the indicator as a goal to work toward during each repetition. ?After each set of 10 deep breaths, practice coughing to be sure your lungs are clear. If you have an incision (the cut made at the time of surgery), support your incision when coughing by placing a pillow or rolled up towels firmly against it. ?Once you are able to get out of bed, walk around indoors and cough well. You may stop using the incentive spirometer when instructed by your caregiver.  ?RISKS AND COMPLICATIONS ?Take  your time so you do not get dizzy or light-headed. ?If you are in pain, you may need to take or ask for pain medication before doing incentive spirometry. It is harder to take a deep breath if you are having pain.

## 2021-06-29 ENCOUNTER — Other Ambulatory Visit: Payer: Self-pay

## 2021-06-29 ENCOUNTER — Encounter (HOSPITAL_COMMUNITY)
Admission: RE | Admit: 2021-06-29 | Discharge: 2021-06-29 | Disposition: A | Discharge status: Home / Self Care | Payer: PPO | Source: Ambulatory Visit | Attending: Orthopedic Surgery | Admitting: Orthopedic Surgery

## 2021-06-29 ENCOUNTER — Encounter (HOSPITAL_COMMUNITY): Payer: Self-pay

## 2021-06-29 VITALS — BP 152/84 | HR 66 | Temp 98.4°F | Resp 16 | Ht 64.0 in | Wt 132.0 lb

## 2021-06-29 DIAGNOSIS — Z01812 Encounter for preprocedural laboratory examination: Secondary | ICD-10-CM | POA: Insufficient documentation

## 2021-06-29 DIAGNOSIS — R7303 Prediabetes: Secondary | ICD-10-CM | POA: Diagnosis not present

## 2021-06-29 DIAGNOSIS — I2510 Atherosclerotic heart disease of native coronary artery without angina pectoris: Secondary | ICD-10-CM | POA: Insufficient documentation

## 2021-06-29 LAB — BASIC METABOLIC PANEL
Anion gap: 8 (ref 5–15)
BUN: 17 mg/dL (ref 8–23)
CO2: 27 mmol/L (ref 22–32)
Calcium: 9.2 mg/dL (ref 8.9–10.3)
Chloride: 99 mmol/L (ref 98–111)
Creatinine, Ser: 0.69 mg/dL (ref 0.44–1.00)
GFR, Estimated: >60 mL/min (ref >60)
Glucose, Bld: 98 mg/dL (ref 70–99)
Potassium: 3.9 mmol/L (ref 3.5–5.1)
Sodium: 134 mmol/L — ABNORMAL LOW (ref 135–145)

## 2021-06-29 LAB — HEMOGLOBIN A1C
Hgb A1c MFr Bld: 5.4 % (ref 4.8–5.6)
Mean Plasma Glucose: 108.28 mg/dL

## 2021-06-29 LAB — CBC
HCT: 42.2 % (ref 36.0–46.0)
Hemoglobin: 14.1 g/dL (ref 12.0–15.0)
MCH: 30.2 pg (ref 26.0–34.0)
MCHC: 33.4 g/dL (ref 30.0–36.0)
MCV: 90.4 fL (ref 80.0–100.0)
Platelets: 276 10*3/uL (ref 150–400)
RBC: 4.67 MIL/uL (ref 3.87–5.11)
RDW: 14.1 % (ref 11.5–15.5)
WBC: 8.8 10*3/uL (ref 4.0–10.5)
nRBC: 0 % (ref 0.0–0.2)

## 2021-06-29 LAB — GLUCOSE, CAPILLARY: Glucose-Capillary: 113 mg/dL — ABNORMAL HIGH (ref 70–99)

## 2021-06-29 LAB — SURGICAL PCR SCREEN
MRSA, PCR: NEGATIVE
Staphylococcus aureus: NEGATIVE

## 2021-06-30 NOTE — H&P (Signed)
?Patient's anticipated LOS is less than 2 midnights, meeting these requirements: ?- Younger than 65 ?- Lives within 1 hour of care ?- Has a competent adult at home to recover with post-op recover ?- NO history of ? - Chronic pain requiring opiods ? - Diabetes ? - Coronary Artery Disease ? - Heart failure ? - Heart attack ? - Stroke ? - DVT/VTE ? - Cardiac arrhythmia ? - Respiratory Failure/COPD ? - Renal failure ? - Anemia ? - Advanced Liver disease ? ?  ? ?[name] is an 81 y.o. female.   ? ?Chief Complaint: left shoulder pain ? ?HPI: Pt is a 81 y.o. female complaining of left shoulder pain for multiple years. Pain had continually increased since the beginning. X-rays in the clinic show end-stage arthritic changes of the left shoulder. Pt has tried various conservative treatments which have failed to alleviate their symptoms, including injections and therapy. Various options are discussed with the patient. Risks, benefits and expectations were discussed with the patient. Patient understand the risks, benefits and expectations and wishes to proceed with surgery.  ? ?PCP:  Lonie Peak, PA-C ? ?D/C Plans: Home ? ?PMH: ?Past Medical History:  ?Diagnosis Date  ? Diabetes mellitus without complication (HCC)   ? hx of 2006 no longer on meds, lost weight 40lbs, no longer a diabetic per patient,  ? Hearing loss   ? wears hearing aids  ? Hypertension   ? Hypothyroidism   ? Thyroid disease   ? Wears dentures   ? ? ?PSH: ?Past Surgical History:  ?Procedure Laterality Date  ? ANTERIOR AND POSTERIOR REPAIR WITH SACROSPINOUS FIXATION N/A 08/17/2016  ? Procedure: ANTERIOR AND POSTERIOR REPAIR;  Surgeon: Harold Hedge, MD;  Location: [facility];  Service: Gynecology;  Laterality: N/A;  ? APPENDECTOMY    ? bilateral cataract surgery     ? CHOLECYSTECTOMY    ? HAND SURGERY Right   ? [facility]  ? HAND SURGERY Left   ? dr Amanda Pea  ? LAPAROSCOPIC VAGINAL HYSTERECTOMY WITH SALPINGO OOPHORECTOMY Bilateral 08/17/2016  ? Procedure:  LAPAROSCOPIC ASSISTED VAGINAL HYSTERECTOMY WITH SALPINGO OOPHORECTOMY;  Surgeon: Harold Hedge, MD;  Location: [facility];  Service: Gynecology;  Laterality: Bilateral;  ? LUMBAR LAMINECTOMY/DECOMPRESSION MICRODISCECTOMY N/A 04/24/2020  ? Procedure: Microlumbar decompression Lumbar Four-Five Lumbar Three-Four lateral mass fusion with autologous/allograft bone;  Surgeon: Jene Every, MD;  Location: [facility];  Service: Orthopedics;  Laterality: N/A;  posterior  ? REVERSE SHOULDER ARTHROPLASTY Right 09/03/2015  ? Procedure: RIGHT REVERSE SHOULDER ARTHROPLASTY;  Surgeon: Beverely Low, MD;  Location: [facility] OR;  Service: Orthopedics;  Laterality: Right;  ? THYROIDECTOMY    ? TONSILLECTOMY    ? TOTAL HIP ARTHROPLASTY    ? bilateral  ? TOTAL KNEE ARTHROPLASTY Left 03/28/2015  ? Procedure: LEFT TOTAL KNEE ARTHROPLASTY;  Surgeon: Eugenia Mcalpine, MD;  Location: [facility];  Service: Orthopedics;  Laterality: Left;  ? TUBAL LIGATION    ? URETHRAL DILATION    ? ? ?Social History:  reports that she has never smoked. She has never used smokeless tobacco. She reports that she does not drink alcohol and does not use drugs. ? ?Allergies:  ?Allergies  ?Allergen Reactions  ? Penicillins Swelling  ?  UNSPECIFIED SWELLING. ?Has patient had reaction causing immediate rash, facial/tongue/throat swelling, SOB or lightheadedness, hypotension: No ?Reaction causing severe rash involving mucus membranes or skin necrosis: No ?Has patient had a PCN reaction that required hospitalization No ?Has patient had a PCN reaction occurring within the last 10 years:  No ?If all of the above answers are "NO", then may proceed with Cephalosporin use. ?  ? Oxycodone Other (See Comments)  ?  Patient Preference  ?Makes her feel "weird" and she prefers not to take it  ? ? ?Medications: ?No current facility-administered medications for this encounter.  ? ?Current Outpatient Medications  ?Medication Sig Dispense Refill  ? acetaminophen (TYLENOL) 650 MG CR tablet Take 650 mg by  mouth every 8 (eight) hours as needed for pain.    ? Brompheniramine-Phenylephrine 1-2.5 MG/5ML syrup Take 5 mLs by mouth daily.    ? furosemide (LASIX) 20 MG tablet Take 20 mg by mouth daily as needed (swelling/fluid retention.).    ? levothyroxine (SYNTHROID, LEVOTHROID) 100 MCG tablet Take 100 mcg by mouth daily before breakfast.     ? lisinopril (ZESTRIL) 10 MG tablet Take 10 mg by mouth daily as needed (blood pressure of 140 or higher).    ? Misc Natural Products (IMMUNE FORMULA PO) Take 1 tablet by mouth in the morning and at bedtime. Immune 24 hour    ? ? ?Results for orders placed or performed during the hospital encounter of 06/29/21 (from the past 48 hour(s))  ?Glucose, capillary     Status: Abnormal  ? Collection Time: 06/29/21 10:54 AM  ?Result Value Ref Range  ? Glucose-Capillary 113 (H) 70 - 99 mg/dL  ?  Comment: Glucose reference range applies only to samples taken after fasting for at least 8 hours.  ?Surgical PCR Screen     Status: None  ? Collection Time: 06/29/21 10:58 AM  ? Specimen: Nasal Mucosa; Nasal Swab  ?Result Value Ref Range  ? MRSA, PCR NEGATIVE NEGATIVE  ? Staphylococcus aureus NEGATIVE NEGATIVE  ?  Comment: (NOTE) ?The Xpert SA Assay (FDA approved for NASAL specimens in patients 3222 ?years of age and older), is one component of a comprehensive ?surveillance program. It is not intended to diagnose infection nor to ?guide or monitor treatment. ?Performed at [facility], [address] ?[place], [place] [address] ?  ?Hemoglobin A1c per protocol     Status: None  ? Collection Time: 06/29/21 10:58 AM  ?Result Value Ref Range  ? Hgb A1c MFr Bld 5.4 4.8 - 5.6 %  ?  Comment: (NOTE) ?Pre diabetes:          5.7%-6.4% ? ?Diabetes:              >6.4% ? ?Glycemic control for   <7.0% ?adults with diabetes ?  ? Mean Plasma Glucose 108.28 mg/dL  ?  Comment: Performed at [facility], [address] [place], [place] [address]  ?Basic metabolic panel per protocol     Status:  Abnormal  ? Collection Time: 06/29/21 10:58 AM  ?Result Value Ref Range  ? Sodium 134 (L) 135 - 145 mmol/L  ? Potassium 3.9 3.5 - 5.1 mmol/L  ? Chloride 99 98 - 111 mmol/L  ? CO2 27 22 - 32 mmol/L  ? Glucose, Bld 98 70 - 99 mg/dL  ?  Comment: Glucose reference range applies only to samples taken after fasting for at least 8 hours.  ? BUN 17 8 - 23 mg/dL  ? Creatinine, Ser 0.69 0.44 - 1.00 mg/dL  ? Calcium 9.2 8.9 - 10.3 mg/dL  ? GFR, Estimated >60 >60 mL/min  ?  Comment: (NOTE) ?Calculated using the CKD-EPI Creatinine Equation (2021) ?  ? Anion gap 8 5 - 15  ?  Comment: Performed at [facility], [address] [place], [place] [address]  ?CBC per protocol     Status: None  ? Collection Time: 06/29/21 10:58 AM  ?Result Value Ref Range  ? WBC 8.8 4.0 - 10.5 K/uL  ? RBC 4.67 3.87 - 5.11 MIL/uL  ? Hemoglobin 14.1 12.0 - 15.0 g/dL  ? HCT 42.2 36.0 - 46.0 %  ? MCV 90.4 80.0 - 100.0 fL  ? MCH 30.2 26.0 - 34.0 pg  ? MCHC 33.4 30.0 - 36.0 g/dL  ? RDW 14.1 11.5 - 15.5 %  ? Platelets 276 150 - 400 K/uL  ? nRBC 0.0 0.0 - 0.2 %  ?  Comment: Performed at [facility], [address] [place], [place] [address]  ? ?No results found. ? ?ROS: ?Pain with rom of the left upper extremity ? ?Physical Exam: ?Alert and oriented 81 y.o. female in no acute distress ?Cranial nerves 2-12 intact ?Cervical spine: full rom with no tenderness, nv intact distally ?Chest: active breath sounds bilaterally, no wheeze rhonchi or rales ?Heart: regular rate and rhythm, no murmur ?Abd: non tender non distended with active bowel sounds ?Hip is stable with rom  ?Left shoulder painful and weak rom ?Nv intact distally ?No rashes or edema distally ? ? ?Assessment/Plan ?Assessment: left shoulder cuff arthropathy ? ?Plan: ? ?Patient will undergo a left reverse total shoulder by Dr. Ranell Patrick at [facility] Risks benefits and expectations were discussed with the patient. Patient understand risks, benefits and expectations and  wishes to proceed. ?Preoperative templating of the joint replacement has been completed, documented, and submitted to the Operating Room personnel in order to optimize intra-operative equipment management.  ?

## 2021-07-10 ENCOUNTER — Ambulatory Visit (HOSPITAL_COMMUNITY)
Admission: RE | Admit: 2021-07-10 | Discharge: 2021-07-10 | Disposition: A | Discharge status: Home / Self Care | Payer: PPO | Attending: Orthopedic Surgery | Admitting: Orthopedic Surgery

## 2021-07-10 ENCOUNTER — Other Ambulatory Visit: Payer: Self-pay

## 2021-07-10 ENCOUNTER — Ambulatory Visit (HOSPITAL_COMMUNITY): Payer: PPO | Admitting: Certified Registered Nurse Anesthetist

## 2021-07-10 ENCOUNTER — Ambulatory Visit (HOSPITAL_BASED_OUTPATIENT_CLINIC_OR_DEPARTMENT_OTHER): Payer: PPO | Admitting: Certified Registered Nurse Anesthetist

## 2021-07-10 ENCOUNTER — Encounter (HOSPITAL_COMMUNITY)
Admission: RE | Disposition: A | Discharge status: Home / Self Care | Payer: Self-pay | Source: Home / Self Care | Attending: Orthopedic Surgery

## 2021-07-10 ENCOUNTER — Encounter (HOSPITAL_COMMUNITY): Payer: Self-pay | Admitting: Orthopedic Surgery

## 2021-07-10 DIAGNOSIS — Z79890 Hormone replacement therapy (postmenopausal): Secondary | ICD-10-CM | POA: Diagnosis not present

## 2021-07-10 DIAGNOSIS — I10 Essential (primary) hypertension: Secondary | ICD-10-CM | POA: Diagnosis not present

## 2021-07-10 DIAGNOSIS — G8918 Other acute postprocedural pain: Secondary | ICD-10-CM | POA: Diagnosis not present

## 2021-07-10 DIAGNOSIS — Z79899 Other long term (current) drug therapy: Secondary | ICD-10-CM | POA: Diagnosis not present

## 2021-07-10 DIAGNOSIS — M19012 Primary osteoarthritis, left shoulder: Secondary | ICD-10-CM

## 2021-07-10 DIAGNOSIS — E039 Hypothyroidism, unspecified: Secondary | ICD-10-CM | POA: Insufficient documentation

## 2021-07-10 DIAGNOSIS — E119 Type 2 diabetes mellitus without complications: Secondary | ICD-10-CM | POA: Diagnosis not present

## 2021-07-10 HISTORY — PX: REVERSE SHOULDER ARTHROPLASTY: SHX5054

## 2021-07-10 LAB — GLUCOSE, CAPILLARY: Glucose-Capillary: 137 mg/dL — ABNORMAL HIGH (ref 70–99)

## 2021-07-10 SURGERY — ARTHROPLASTY, SHOULDER, TOTAL, REVERSE
Anesthesia: Regional | Site: Shoulder | Laterality: Left

## 2021-07-10 MED ORDER — ORAL CARE MOUTH RINSE: 15.0000 mL | Freq: Once | OROMUCOSAL | Status: AC

## 2021-07-10 MED ORDER — ACETAMINOPHEN 160 MG/5ML PO SOLN: 1000.0000 mg | Freq: Once | ORAL | Status: DC | PRN

## 2021-07-10 MED ORDER — FENTANYL CITRATE PF 50 MCG/ML IJ SOSY: 25.0000 ug | PREFILLED_SYRINGE | INTRAMUSCULAR | Status: DC | PRN

## 2021-07-10 MED ORDER — DEXAMETHASONE SODIUM PHOSPHATE 10 MG/ML IJ SOLN
INTRAMUSCULAR | Status: DC | PRN
  Administered 2021-07-10: 4 mg via INTRAVENOUS

## 2021-07-10 MED ORDER — FENTANYL CITRATE PF 50 MCG/ML IJ SOSY
50.0000 ug | PREFILLED_SYRINGE | INTRAMUSCULAR | Status: DC
  Administered 2021-07-10: 25 ug via INTRAVENOUS
  Filled 2021-07-10: qty 2

## 2021-07-10 MED ORDER — SODIUM CHLORIDE 0.9 % IR SOLN
Status: DC | PRN
  Administered 2021-07-10: 1000 mL

## 2021-07-10 MED ORDER — LIDOCAINE HCL (PF) 2 % IJ SOLN
INTRAMUSCULAR | Status: AC
  Filled 2021-07-10: qty 5

## 2021-07-10 MED ORDER — ACETAMINOPHEN 500 MG PO TABS: 1000.0000 mg | ORAL_TABLET | Freq: Once | ORAL | Status: DC | PRN

## 2021-07-10 MED ORDER — HYDROCODONE-ACETAMINOPHEN 5-325 MG PO TABS: 1.0000 | ORAL_TABLET | Freq: Four times a day (QID) | ORAL | 0 refills | Status: DC | PRN

## 2021-07-10 MED ORDER — BUPIVACAINE-EPINEPHRINE (PF) 0.25% -1:200000 IJ SOLN
INTRAMUSCULAR | Status: AC
  Filled 2021-07-10: qty 30

## 2021-07-10 MED ORDER — DEXAMETHASONE SODIUM PHOSPHATE 10 MG/ML IJ SOLN
INTRAMUSCULAR | Status: AC
  Filled 2021-07-10: qty 1

## 2021-07-10 MED ORDER — BUPIVACAINE LIPOSOME 1.3 % IJ SUSP
INTRAMUSCULAR | Status: DC | PRN
  Administered 2021-07-10: 33 mg via PERINEURAL

## 2021-07-10 MED ORDER — LIDOCAINE 2% (20 MG/ML) 5 ML SYRINGE
INTRAMUSCULAR | Status: DC | PRN
  Administered 2021-07-10: 40 mg via INTRAVENOUS

## 2021-07-10 MED ORDER — ONDANSETRON HCL 4 MG/2ML IJ SOLN
INTRAMUSCULAR | Status: DC | PRN
  Administered 2021-07-10: 4 mg via INTRAVENOUS

## 2021-07-10 MED ORDER — PHENYLEPHRINE HCL-NACL 20-0.9 MG/250ML-% IV SOLN
INTRAVENOUS | Status: AC
  Filled 2021-07-10: qty 250

## 2021-07-10 MED ORDER — PHENYLEPHRINE HCL-NACL 20-0.9 MG/250ML-% IV SOLN
INTRAVENOUS | Status: DC | PRN
  Administered 2021-07-10: 50 ug/min via INTRAVENOUS

## 2021-07-10 MED ORDER — ROCURONIUM BROMIDE 10 MG/ML (PF) SYRINGE
PREFILLED_SYRINGE | INTRAVENOUS | Status: AC
  Filled 2021-07-10: qty 10

## 2021-07-10 MED ORDER — BUPIVACAINE-EPINEPHRINE (PF) 0.25% -1:200000 IJ SOLN
INTRAMUSCULAR | Status: DC | PRN
Start: 2021-07-10 — End: 2021-07-10
  Administered 2021-07-10: 10 mL

## 2021-07-10 MED ORDER — ROCURONIUM BROMIDE 10 MG/ML (PF) SYRINGE
PREFILLED_SYRINGE | INTRAVENOUS | Status: DC | PRN
  Administered 2021-07-10: 40 mg via INTRAVENOUS

## 2021-07-10 MED ORDER — ALBUMIN HUMAN 5 % IV SOLN: INTRAVENOUS | Status: DC | PRN

## 2021-07-10 MED ORDER — SUGAMMADEX SODIUM 200 MG/2ML IV SOLN
INTRAVENOUS | Status: DC | PRN
  Administered 2021-07-10: 150 mg via INTRAVENOUS

## 2021-07-10 MED ORDER — METHOCARBAMOL 500 MG PO TABS: 500.0000 mg | ORAL_TABLET | Freq: Three times a day (TID) | ORAL | 1 refills | Status: DC | PRN

## 2021-07-10 MED ORDER — PROPOFOL 10 MG/ML IV BOLUS
INTRAVENOUS | Status: DC | PRN
  Administered 2021-07-10: 100 mg via INTRAVENOUS

## 2021-07-10 MED ORDER — ONDANSETRON HCL 4 MG/2ML IJ SOLN
INTRAMUSCULAR | Status: AC
  Filled 2021-07-10: qty 2

## 2021-07-10 MED ORDER — LACTATED RINGERS IV SOLN
INTRAVENOUS | Status: DC
Start: 2021-07-10 — End: 2021-07-10

## 2021-07-10 MED ORDER — PROPOFOL 10 MG/ML IV BOLUS
INTRAVENOUS | Status: AC
  Filled 2021-07-10: qty 20

## 2021-07-10 MED ORDER — ACETAMINOPHEN 10 MG/ML IV SOLN: 1000.0000 mg | Freq: Once | INTRAVENOUS | Status: DC | PRN

## 2021-07-10 MED ORDER — STERILE WATER FOR IRRIGATION IR SOLN
Status: DC | PRN
  Administered 2021-07-10: 2000 mL

## 2021-07-10 MED ORDER — BUPIVACAINE-EPINEPHRINE (PF) 0.5% -1:200000 IJ SOLN
INTRAMUSCULAR | Status: DC | PRN
  Administered 2021-07-10: 15 mL via PERINEURAL

## 2021-07-10 MED ORDER — CEFAZOLIN SODIUM-DEXTROSE 2-4 GM/100ML-% IV SOLN
2.0000 g | INTRAVENOUS | Status: AC
  Administered 2021-07-10: 2 g via INTRAVENOUS
  Filled 2021-07-10: qty 100

## 2021-07-10 MED ORDER — TRANEXAMIC ACID-NACL 1000-0.7 MG/100ML-% IV SOLN
1000.0000 mg | INTRAVENOUS | Status: AC
  Administered 2021-07-10: 1000 mg via INTRAVENOUS
  Filled 2021-07-10: qty 100

## 2021-07-10 MED ORDER — GLYCOPYRROLATE PF 0.2 MG/ML IJ SOSY
PREFILLED_SYRINGE | INTRAMUSCULAR | Status: DC | PRN
  Administered 2021-07-10 (×2): .2 mg via INTRAVENOUS

## 2021-07-10 MED ORDER — CHLORHEXIDINE GLUCONATE 0.12 % MT SOLN
15.0000 mL | Freq: Once | OROMUCOSAL | Status: AC
  Administered 2021-07-10: 15 mL via OROMUCOSAL

## 2021-07-10 SURGICAL SUPPLY — 74 items
AID PSTN UNV HD RSTRNT DISP (MISCELLANEOUS) ×1
BAG COUNTER SPONGE SURGICOUNT (BAG) IMPLANT
BAG SPEC THK2 15X12 ZIP CLS (MISCELLANEOUS)
BAG SPNG CNTER NS LX DISP (BAG)
BAG ZIPLOCK 12X15 (MISCELLANEOUS) IMPLANT
BIT DRILL 1.6MX128 (BIT) IMPLANT
BIT DRILL 170X2.5X (BIT) IMPLANT
BIT DRL 170X2.5X (BIT) ×1
BLADE SAG 18X100X1.27 (BLADE) ×2 IMPLANT
COVER BACK TABLE 60X90IN (DRAPES) ×2 IMPLANT
COVER SURGICAL LIGHT HANDLE (MISCELLANEOUS) ×2 IMPLANT
DRAPE INCISE IOBAN 66X45 STRL (DRAPES) ×2 IMPLANT
DRAPE ORTHO SPLIT 77X108 STRL (DRAPES) ×4
DRAPE SHEET LG 3/4 BI-LAMINATE (DRAPES) ×2 IMPLANT
DRAPE SURG ORHT 6 SPLT 77X108 (DRAPES) ×2 IMPLANT
DRAPE TOP 10253 STERILE (DRAPES) ×2 IMPLANT
DRAPE U-SHAPE 47X51 STRL (DRAPES) ×2 IMPLANT
DRILL 2.5 (BIT) ×2
DRSG ADAPTIC 3X8 NADH LF (GAUZE/BANDAGES/DRESSINGS) ×2 IMPLANT
DRSG PAD ABDOMINAL 8X10 ST (GAUZE/BANDAGES/DRESSINGS) ×2 IMPLANT
DURAPREP 26ML APPLICATOR (WOUND CARE) ×2 IMPLANT
ELECT BLADE TIP CTD 4 INCH (ELECTRODE) ×2 IMPLANT
ELECT NDL TIP 2.8 STRL (NEEDLE) ×1 IMPLANT
ELECT NEEDLE TIP 2.8 STRL (NEEDLE) ×2 IMPLANT
ELECT REM PT RETURN 15FT ADLT (MISCELLANEOUS) ×2 IMPLANT
EPI LT SZ 1 (Orthopedic Implant) ×2 IMPLANT
EPIPHYSIS LT SZ 1 (Orthopedic Implant) IMPLANT
FACESHIELD WRAPAROUND (MASK) ×2 IMPLANT
FACESHIELD WRAPAROUND OR TEAM (MASK) ×1 IMPLANT
GAUZE SPONGE 4X4 12PLY STRL (GAUZE/BANDAGES/DRESSINGS) ×2 IMPLANT
GLENOSPHERE DELTA XTEND LAT 38 (Miscellaneous) ×1 IMPLANT
GLOVE SURG ORTHO LTX SZ7.5 (GLOVE) ×2 IMPLANT
GLOVE SURG ORTHO LTX SZ8.5 (GLOVE) ×2 IMPLANT
GLOVE SURG UNDER POLY LF SZ7.5 (GLOVE) ×2 IMPLANT
GLOVE SURG UNDER POLY LF SZ8.5 (GLOVE) ×2 IMPLANT
GOWN STRL REUS W/ TWL XL LVL3 (GOWN DISPOSABLE) ×2 IMPLANT
GOWN STRL REUS W/TWL XL LVL3 (GOWN DISPOSABLE) ×4
KIT BASIN OR (CUSTOM PROCEDURE TRAY) ×2 IMPLANT
KIT TURNOVER KIT A (KITS) IMPLANT
MANIFOLD NEPTUNE II (INSTRUMENTS) ×2 IMPLANT
METAGLENE DELTA EXTEND (Trauma) IMPLANT
METAGLENE DXTEND (Trauma) ×2 IMPLANT
NDL MAYO CATGUT SZ4 TPR NDL (NEEDLE) IMPLANT
NEEDLE MAYO CATGUT SZ4 (NEEDLE) ×2 IMPLANT
NS IRRIG 1000ML POUR BTL (IV SOLUTION) ×2 IMPLANT
PACK SHOULDER (CUSTOM PROCEDURE TRAY) ×2 IMPLANT
PIN GUIDE 1.2 (PIN) ×1 IMPLANT
PIN GUIDE GLENOPHERE 1.5MX300M (PIN) ×1 IMPLANT
PIN METAGLENE 2.5 (PIN) ×1 IMPLANT
PROTECTOR NERVE ULNAR (MISCELLANEOUS) ×2 IMPLANT
RESTRAINT HEAD UNIVERSAL NS (MISCELLANEOUS) ×2 IMPLANT
SCREW 4.5X18MM (Screw) ×2 IMPLANT
SCREW 4.5X36MM (Screw) ×1 IMPLANT
SCREW BN 18X4.5XSTRL SHLDR (Screw) IMPLANT
SCREW LOCK DELTA XTEND 4.5X30 (Screw) ×1 IMPLANT
SLING ARM FOAM STRAP LRG (SOFTGOODS) ×1 IMPLANT
SMARTMIX MINI TOWER (MISCELLANEOUS)
SPACER 38 PLUS 3 (Spacer) ×1 IMPLANT
SPIKE FLUID TRANSFER (MISCELLANEOUS) ×2 IMPLANT
SPONGE T-LAP 18X18 ~~LOC~~+RFID (SPONGE) ×3 IMPLANT
SPONGE T-LAP 4X18 ~~LOC~~+RFID (SPONGE) ×1 IMPLANT
STEM DELTA DIA 10 HA (Stem) ×1 IMPLANT
STRIP CLOSURE SKIN 1/2X4 (GAUZE/BANDAGES/DRESSINGS) ×2 IMPLANT
SUCTION FRAZIER HANDLE 10FR (MISCELLANEOUS) ×2
SUCTION TUBE FRAZIER 10FR DISP (MISCELLANEOUS) ×1 IMPLANT
SUT FIBERWIRE #2 38 T-5 BLUE (SUTURE) ×8
SUT MNCRL AB 4-0 PS2 18 (SUTURE) ×2 IMPLANT
SUT VIC AB 0 CT1 36 (SUTURE) ×4 IMPLANT
SUT VIC AB 0 CT2 27 (SUTURE) ×2 IMPLANT
SUT VIC AB 2-0 CT1 27 (SUTURE) ×2
SUT VIC AB 2-0 CT1 TAPERPNT 27 (SUTURE) ×1 IMPLANT
SUTURE FIBERWR #2 38 T-5 BLUE (SUTURE) ×2 IMPLANT
TOWEL OR 17X26 10 PK STRL BLUE (TOWEL DISPOSABLE) ×2 IMPLANT
TOWER SMARTMIX MINI (MISCELLANEOUS) IMPLANT

## 2021-07-10 NOTE — Transfer of Care (Signed)
Immediate Anesthesia Transfer of Care Note ? ?Patient: [name] ? ?Procedure(s) Performed: REVERSE SHOULDER ARTHROPLASTY (Left: Shoulder) ? ?Patient Location: PACU ? ?Anesthesia Type:General and Regional ? ?Level of Consciousness: awake, alert  and oriented ? ?Airway & Oxygen Therapy: Patient Spontanous Breathing ? ?Post-op Assessment: Report given to RN and Post -op Vital signs reviewed and stable ? ?Post vital signs: Reviewed and stable ? ?Last Vitals:  ?Vitals Value Taken Time  ?BP 137/75 07/10/21 1529  ?Temp    ?Pulse 65 07/10/21 1530  ?Resp 18 07/10/21 1530  ?SpO2 100 % 07/10/21 1530  ?Vitals shown include unvalidated device data. ? ?Last Pain:  ?Vitals:  ? 07/10/21 1315  ?TempSrc:   ?PainSc: 0-No pain  ?   ? ?Patients Stated Pain Goal: 3 (07/10/21 1049) ? ?Complications: No notable events documented. ?

## 2021-07-10 NOTE — Interval H&P Note (Signed)
History and Physical Interval Note: ? ?07/10/2021 ?1:23 PM ? ?[name]  has presented today for surgery, with the diagnosis of Osteoarthritis of joint of left shoulder region.  The various methods of treatment have been discussed with the patient and family. After consideration of risks, benefits and other options for treatment, the patient has consented to  Procedure(s): ?REVERSE SHOULDER ARTHROPLASTY (Left) as a surgical intervention.  The patient's history has been reviewed, patient examined, no change in status, stable for surgery.  I have reviewed the patient's chart and labs.  Questions were answered to the patient's satisfaction.   ? ? ?Verlee Rossetti ? ? ?

## 2021-07-10 NOTE — Anesthesia Procedure Notes (Signed)
Procedure Name: Intubation ?Date/Time: 07/10/2021 1:36 PM ?Performed by: Sudie Grumbling, CRNA ?Pre-anesthesia Checklist: Patient identified, Emergency Drugs available, Suction available and Patient being monitored ?Patient Re-evaluated:Patient Re-evaluated prior to induction ?Oxygen Delivery Method: Circle system utilized ?Preoxygenation: Pre-oxygenation with 100% oxygen ?Induction Type: IV induction ?Ventilation: Mask ventilation without difficulty ?Laryngoscope Size: Hyacinth Meeker and 2 ?Grade View: Grade I ?Tube type: Oral ?Tube size: 7.0 mm ?Number of attempts: 1 ?Airway Equipment and Method: Stylet ?Placement Confirmation: ETT inserted through vocal cords under direct vision, positive ETCO2 and breath sounds checked- equal and bilateral ?Secured at: 21 cm ?Tube secured with: Tape ?Dental Injury: Teeth and Oropharynx as per pre-operative assessment  ? ? ? ? ?

## 2021-07-10 NOTE — Interval H&P Note (Signed)
History and Physical Interval Note: ? ?07/10/2021 ?11:30 AM ? ?[name]  has presented today for surgery, with the diagnosis of Osteoarthritis of joint of left shoulder region.  The various methods of treatment have been discussed with the patient and family. After consideration of risks, benefits and other options for treatment, the patient has consented to  Procedure(s): ?REVERSE SHOULDER ARTHROPLASTY (Left) as a surgical intervention.  The patient's history has been reviewed, patient examined, no change in status, stable for surgery.  I have reviewed the patient's chart and labs.  Questions were answered to the patient's satisfaction.   ? ? ?Augustin Schooling ? ? ?

## 2021-07-10 NOTE — Op Note (Signed)
NAME: [name], [name] ?MEDICAL RECORD NO: [id number] ?ACCOUNT NO: [id number] ?DATE OF BIRTH: [birth year] ?FACILITY: [facility] ?LOCATION: [facility] ?PHYSICIAN: Doran Heater. Veverly Fells, MD ? ?Operative Report  ? ?DATE OF PROCEDURE: 07/10/2021 ? ?PREOPERATIVE DIAGNOSIS:  Left shoulder end-stage arthritis. ? ?POSTOPERATIVE DIAGNOSIS:  Left shoulder end-stage arthritis. ? ?PROCEDURE PERFORMED:  Left reverse shoulder replacement using DePuy Delta Xtend prosthesis with subscapularis repair. ? ?ATTENDING SURGEON:  Doran Heater. Veverly Fells, MD ? ?ASSISTANT:  Darol Destine, [place], who was scrubbed during the entire procedure, and necessary for satisfactory completion of surgery. ? ?ANESTHESIA:  General anesthesia was used plus interscalene block. ? ?ESTIMATED BLOOD LOSS:  100 mL. ? ?FLUID REPLACEMENT:  1500 mL crystalloid. ? ?COUNTS:  Instrument counts were correct. ? ?COMPLICATIONS:  There were no complications. ? ?ANTIBIOTICS:  Perioperative antibiotics were given. ? ?INDICATIONS:  The patient is an 81 year old female with worsening left shoulder pain secondary to end-stage arthritis, bone-on-bone.  The patient has had progressive functional loss and increased pain despite conservative management.  The patient  ?presents for reverse shoulder replacement today to restore function and to eliminate pain.  Informed consent obtained. ? ?DESCRIPTION OF PROCEDURE:  After an adequate level of anesthesia was achieved, the patient was positioned in the modified beach chair position.  Left shoulder correctly identified and sterilely prepped and draped in the usual manner.  Timeout called,  ?verifying correct patient, correct site. We entered the patient's shoulder using standard deltopectoral approach, starting at the coracoid process extending down the anterior humerus.  Dissection down through subcutaneous tissues using Bovie.  We  ?identified the cephalic vein and took that laterally with the deltoid, pectoralis taken medially.  Conjoined tendon  identified and retracted medially.  We tenodesed the biceps in situ with 0 Vicryl figure-of-eight suture x2.  We then went ahead and  ?released the subscapularis subperiosteally off the lesser tuberosity.  We tagged that with #2 FiberWire suture in a modified Mason-Allen suture technique for repair of the tendon at the end.  We then released the inferior capsule extending the shoulder  ?and delivering the humeral head out of the wound.  We entered the proximal humerus with a 6 mm reamer.  We noted there to be eburnated bone over the humeral head and the glenoid face.  We reamed up to a size 10 mm.  We then used a 10 mm T handle guide  ?and resected the head at 20 degrees of retroversion with the oscillating saw. We removed excess osteophytes with a rongeur.  We then subluxed the humerus posteriorly.  This gave [place] good exposure of the glenoid face.  We then removed the capsule of the  ?labrum and placed our deep retractors.  We found the center point for the metaglene baseplate.  We went ahead and reamed for the metaglene baseplate.  We then drilled out the central peg hole, impacted the metaglene into position. Once we had that in  ?place, we placed a 36 screw inferiorly, a 30 screw at the base of the coracoid and 18 screw posteriorly.  We had great baseplate security.  We went ahead and locked the screws in place.  We then placed a 38 standard glenosphere onto the baseplate and  ?secured it.  Next, we went back to the humeral side and reamed for the one left metaphysis.  We then used a 10 stem, 1 left metaphysis trial, impacted that in 20 degrees of retroversion, placed our trial 38+3 poly on the humeral tray and reduced the  ?shoulder.  We were pleased with our soft tissue balancing.  We then removed all trial components.  We irrigated thoroughly, drilled holes in lesser tuberosity and placed #2 FiberWire suture for repair of the subscap.  At this point, we went ahead and  ?selected the real components, which was  a 10 HA coated stem with the HA coated 1 left metaphysis.  We set that on the 0 setting and impacted that with available bone graft using impaction grafting technique in 20 degrees of retroversion.  Once that stem  ?was in place, it was nice and stable. We went ahead and selected the real 38+3 poly, impacted on the humeral tray, reduced the shoulder.  We irrigated thoroughly and repaired the subscapularis meticulously back to bone.  We then repaired the  ?deltopectoral interval with 0 Vicryl suture followed by 2-0 Vicryl for subcutaneous closure and 4-0 Monocryl for skin.  Steri-Strips were applied followed by sterile dressing.  The patient tolerated surgery well. ? ? ?NIK ?D: 07/10/2021 3:28:18 pm T: 07/10/2021 10:33:00 pm  ?JOB: [id number]  ?

## 2021-07-10 NOTE — Brief Op Note (Signed)
07/10/2021 ? ?3:23 PM ? ?PATIENT:  [name]  81 y.o. female ? ?PRE-OPERATIVE DIAGNOSIS:  Osteoarthritis of joint of left shoulder region ? ?POST-OPERATIVE DIAGNOSIS:  Osteoarthritis of joint of left shoulder region ? ?PROCEDURE:  Procedure(s): ?REVERSE SHOULDER ARTHROPLASTY (Left) DePuy delta extend with Subscap repair ? ?SURGEON:  Surgeon(s) and Role: ?   Beverely Low, MD - Primary ? ?PHYSICIAN ASSISTANT:  ? ?ASSISTANTS: Thea Gist, PA-C  ? ?ANESTHESIA:   regional and general ? ?EBL:  200 mL  ? ?BLOOD ADMINISTERED:none ? ?DRAINS: none  ? ?LOCAL MEDICATIONS USED:  MARCAINE    ? ?SPECIMEN:  No Specimen ? ?DISPOSITION OF SPECIMEN:  N/A ? ?COUNTS:  YES ? ?TOURNIQUET:  * No tourniquets in log * ? ?DICTATION: .Other Dictation: Dictation Number [id number] ? ?PLAN OF CARE: Discharge to home after PACU ? ?PATIENT DISPOSITION:  PACU - hemodynamically stable. ?  ?Delay start of Pharmacological VTE agent (>24hrs) due to surgical blood loss or risk of bleeding: not applicable ? ?

## 2021-07-10 NOTE — Anesthesia Preprocedure Evaluation (Signed)
Anesthesia Evaluation  ?Patient identified by MRN, date of birth, ID band ?Patient awake ? ? ? ?Reviewed: ?Allergy & Precautions, H&P , NPO status , Patient's Chart, lab work & pertinent test results ? ?History of Anesthesia Complications ?Negative for: history of anesthetic complications ? ?Airway ?Mallampati: II ? ?TM Distance: >3 FB ?Neck ROM: Full ? ? ? Dental ? ?(+) Edentulous Upper, Edentulous Lower, Dental Advisory Given ?  ?Pulmonary ?neg pulmonary ROS,  ?  ?breath sounds clear to auscultation ? ? ? ? ? ? Cardiovascular ?Exercise Tolerance: Good ?hypertension, Pt. on medications ?(-) angina ?Rhythm:Regular  ? ?  ?Neuro/Psych ?negative neurological ROS ? negative psych ROS  ? GI/Hepatic ?negative GI ROS, Neg liver ROS,   ?Endo/Other  ?diabetesHypothyroidism  ? Renal/GU ?negative Renal ROS  ? ?  ?Musculoskeletal ? ?(+) Arthritis , Osteoarthritis of joint of left shoulder region  ? Abdominal ?  ?Peds ? Hematology ?negative hematology ROS ?(+)   ?Anesthesia Other Findings ? ? Reproductive/Obstetrics ? ?  ? ? ? ? ? ? ? ? ? ? ? ? ? ?  ?  ? ? ? ? ? ? ? ? ?Anesthesia Physical ?Anesthesia Plan ? ?ASA: 2 ? ?Anesthesia Plan: General and Regional  ? ?Post-op Pain Management: Regional block* and Toradol IV (intra-op)*  ? ?Induction: Intravenous ? ?PONV Risk Score and Plan: 3 and Dexamethasone and Ondansetron ? ?Airway Management Planned: Oral ETT ? ?Additional Equipment: None ? ?Intra-op Plan:  ? ?Post-operative Plan: Extubation in OR ? ?Informed Consent: I have reviewed the patients History and Physical, chart, labs and discussed the procedure including the risks, benefits and alternatives for the proposed anesthesia with the patient or authorized representative who has indicated his/her understanding and acceptance.  ? ? ? ?Dental advisory given ? ?Plan Discussed with: Anesthesiologist and CRNA ? ?Anesthesia Plan Comments:   ? ? ? ? ? ? ?Anesthesia Quick Evaluation ? ?

## 2021-07-10 NOTE — Discharge Instructions (Signed)
Ice to the shoulder constantly.  Keep the incision covered and clean and dry for one week, then ok to get it wet in the shower. ° °Do exercise as instructed several times per day. ° °DO NOT reach behind your back or push up out of a chair with the operative arm. ° °Use a sling while you are up and around for comfort, may remove while seated.  Keep pillow propped behind the operative elbow. ° °Follow up with Dr Joretta Eads in two weeks in the office, call [phone] for appt °

## 2021-07-10 NOTE — Progress Notes (Signed)
Assisted Dr. Moser with left, interscalene , ultrasound guided block. Side rails up, monitors on throughout procedure. See vital signs in flow sheet. Tolerated Procedure well. 

## 2021-07-10 NOTE — Anesthesia Procedure Notes (Signed)
Anesthesia Regional Block: Interscalene brachial plexus block  ? ?Pre-Anesthetic Checklist: , timeout performed,  Correct Patient, Correct Site, Correct Laterality,  Correct Procedure, Correct Position, site marked,  Risks and benefits discussed,  Surgical consent,  Pre-op evaluation,  At surgeon's request and post-op pain management ? ?Laterality: Left and Upper ? ?Prep: chloraprep     ?  ?Needles:  ?Injection technique: Single-shot ? ?  ? ? ?Needle Length: 5cm  ?Needle Gauge: 22  ? ? ? ?Additional Needles: ?Arrow? StimuQuik? ECHO Echogenic Stimulating PNB Needle ? ?Procedures:,,,, ultrasound used (permanent image in chart),,    ?Narrative:  ?Start time: 07/10/2021 12:54 PM ?End time: 07/10/2021 1:01 PM ?Injection made incrementally with aspirations every 5 mL. ? ?Performed by: Personally  ?Anesthesiologist: Oleta Mouse, MD ? ? ? ? ?

## 2021-07-12 NOTE — Anesthesia Postprocedure Evaluation (Signed)
Anesthesia Post Note ? ?Patient: [name] ? ?Procedure(s) Performed: REVERSE SHOULDER ARTHROPLASTY (Left: Shoulder) ? ?  ? ?Patient location during evaluation: PACU ?Anesthesia Type: Regional ?Level of consciousness: awake and patient cooperative ?Pain management: pain level controlled ?Vital Signs Assessment: post-procedure vital signs reviewed and stable ?Respiratory status: spontaneous breathing, nonlabored ventilation and respiratory function stable ?Cardiovascular status: stable and blood pressure returned to baseline ?Postop Assessment: no apparent nausea or vomiting ?Anesthetic complications: no ? ? ?No notable events documented. ? ?Last Vitals:  ?Vitals:  ? 07/10/21 1545 07/10/21 1558  ?BP: (!) 122/92 139/67  ?Pulse: 64 68  ?Resp: 16 16  ?Temp:  36.6 ?C  ?SpO2: 99% 99%  ?  ?Last Pain:  ?Vitals:  ? 07/10/21 1558  ?TempSrc:   ?PainSc: 0-No pain  ? ? ?  ?  ?  ?  ?  ?  ? ?Elody Kleinsasser ? ? ? ? ?

## 2021-07-13 ENCOUNTER — Encounter (HOSPITAL_COMMUNITY): Payer: Self-pay | Admitting: Orthopedic Surgery

## 2021-07-22 DIAGNOSIS — Z4789 Encounter for other orthopedic aftercare: Secondary | ICD-10-CM | POA: Diagnosis not present

## 2021-08-09 DIAGNOSIS — I10 Essential (primary) hypertension: Secondary | ICD-10-CM | POA: Diagnosis not present

## 2021-08-09 DIAGNOSIS — E039 Hypothyroidism, unspecified: Secondary | ICD-10-CM | POA: Diagnosis not present

## 2021-08-19 DIAGNOSIS — Z4789 Encounter for other orthopedic aftercare: Secondary | ICD-10-CM | POA: Diagnosis not present

## 2021-09-10 DIAGNOSIS — M7061 Trochanteric bursitis, right hip: Secondary | ICD-10-CM | POA: Diagnosis not present

## 2021-09-10 DIAGNOSIS — M25551 Pain in right hip: Secondary | ICD-10-CM | POA: Diagnosis not present

## 2021-09-10 DIAGNOSIS — M25552 Pain in left hip: Secondary | ICD-10-CM | POA: Diagnosis not present

## 2021-10-22 DIAGNOSIS — R001 Bradycardia, unspecified: Secondary | ICD-10-CM | POA: Diagnosis not present

## 2021-10-22 DIAGNOSIS — R7303 Prediabetes: Secondary | ICD-10-CM | POA: Diagnosis not present

## 2021-10-22 DIAGNOSIS — M25552 Pain in left hip: Secondary | ICD-10-CM | POA: Diagnosis not present

## 2021-10-22 DIAGNOSIS — M4316 Spondylolisthesis, lumbar region: Secondary | ICD-10-CM | POA: Diagnosis not present

## 2021-10-22 DIAGNOSIS — Z139 Encounter for screening, unspecified: Secondary | ICD-10-CM | POA: Diagnosis not present

## 2021-10-22 DIAGNOSIS — E039 Hypothyroidism, unspecified: Secondary | ICD-10-CM | POA: Diagnosis not present

## 2021-10-22 DIAGNOSIS — M25551 Pain in right hip: Secondary | ICD-10-CM | POA: Diagnosis not present

## 2021-10-22 DIAGNOSIS — M545 Low back pain, unspecified: Secondary | ICD-10-CM | POA: Diagnosis not present

## 2021-10-22 DIAGNOSIS — R609 Edema, unspecified: Secondary | ICD-10-CM | POA: Diagnosis not present

## 2021-10-22 DIAGNOSIS — I10 Essential (primary) hypertension: Secondary | ICD-10-CM | POA: Diagnosis not present

## 2021-10-27 DIAGNOSIS — R21 Rash and other nonspecific skin eruption: Secondary | ICD-10-CM | POA: Diagnosis not present

## 2021-10-27 DIAGNOSIS — I10 Essential (primary) hypertension: Secondary | ICD-10-CM | POA: Diagnosis not present

## 2021-12-04 DIAGNOSIS — E103291 Type 1 diabetes mellitus with mild nonproliferative diabetic retinopathy without macular edema, right eye: Secondary | ICD-10-CM | POA: Diagnosis not present

## 2021-12-04 DIAGNOSIS — H52203 Unspecified astigmatism, bilateral: Secondary | ICD-10-CM | POA: Diagnosis not present

## 2021-12-16 DIAGNOSIS — M545 Low back pain, unspecified: Secondary | ICD-10-CM | POA: Diagnosis not present

## 2021-12-29 ENCOUNTER — Telehealth: Payer: Self-pay | Admitting: *Deleted

## 2021-12-29 NOTE — Patient Outreach (Signed)
  Care Coordination   Initial Visit Note   12/29/2021 Name: [name] MRN: [id number] DOB: 09-30-1940  [name] is a 81 y.o. year old female who sees Cyndi Bender, [place] for primary care. I spoke with  Prudence Davidson by phone today.  What matters to the patients health and wellness today?  Denies needs for Care Coordination- reports she is active, healthy (A1C was 5)  Widow- "miss my husband"- has attended the grief support offered locally.     Goals Addressed   None     SDOH assessments and interventions completed:  Yes  SDOH Interventions Today    Flowsheet Row Most Recent Value  SDOH Interventions   Food Insecurity Interventions Intervention Not Indicated  Housing Interventions Intervention Not Indicated  Transportation Interventions Intervention Not Indicated  Utilities Interventions Intervention Not Indicated  Financial Strain Interventions Intervention Not Indicated  Social Connections Interventions Intervention Not Indicated        Care Coordination Interventions Activated:  No  Care Coordination Interventions:  No, not indicated   Follow up plan: No further intervention required.   Encounter Outcome:  Pt. Visit Completed   Eduard Clos MSW, LCSW Licensed Clinical Social Worker      [phone]

## 2022-02-08 DIAGNOSIS — M545 Low back pain, unspecified: Secondary | ICD-10-CM | POA: Diagnosis not present

## 2022-02-10 DIAGNOSIS — M545 Low back pain, unspecified: Secondary | ICD-10-CM | POA: Diagnosis not present

## 2022-02-22 DIAGNOSIS — M48062 Spinal stenosis, lumbar region with neurogenic claudication: Secondary | ICD-10-CM | POA: Diagnosis not present

## 2022-02-22 DIAGNOSIS — M4316 Spondylolisthesis, lumbar region: Secondary | ICD-10-CM | POA: Diagnosis not present

## 2022-02-22 DIAGNOSIS — M545 Low back pain, unspecified: Secondary | ICD-10-CM | POA: Diagnosis not present

## 2022-02-25 DIAGNOSIS — M5416 Radiculopathy, lumbar region: Secondary | ICD-10-CM | POA: Diagnosis not present

## 2022-03-25 DIAGNOSIS — M48062 Spinal stenosis, lumbar region with neurogenic claudication: Secondary | ICD-10-CM | POA: Diagnosis not present

## 2022-03-25 DIAGNOSIS — M5416 Radiculopathy, lumbar region: Secondary | ICD-10-CM | POA: Diagnosis not present

## 2022-03-25 DIAGNOSIS — M545 Low back pain, unspecified: Secondary | ICD-10-CM | POA: Diagnosis not present

## 2022-04-22 DIAGNOSIS — L570 Actinic keratosis: Secondary | ICD-10-CM | POA: Diagnosis not present

## 2022-04-22 DIAGNOSIS — L728 Other follicular cysts of the skin and subcutaneous tissue: Secondary | ICD-10-CM | POA: Diagnosis not present

## 2022-04-28 DIAGNOSIS — I10 Essential (primary) hypertension: Secondary | ICD-10-CM | POA: Diagnosis not present

## 2022-04-28 DIAGNOSIS — E039 Hypothyroidism, unspecified: Secondary | ICD-10-CM | POA: Diagnosis not present

## 2022-04-28 DIAGNOSIS — R609 Edema, unspecified: Secondary | ICD-10-CM | POA: Diagnosis not present

## 2022-04-28 DIAGNOSIS — Z1331 Encounter for screening for depression: Secondary | ICD-10-CM | POA: Diagnosis not present

## 2022-04-28 DIAGNOSIS — R001 Bradycardia, unspecified: Secondary | ICD-10-CM | POA: Diagnosis not present

## 2022-04-28 DIAGNOSIS — R7303 Prediabetes: Secondary | ICD-10-CM | POA: Diagnosis not present

## 2022-05-05 DIAGNOSIS — Z5181 Encounter for therapeutic drug level monitoring: Secondary | ICD-10-CM | POA: Diagnosis not present

## 2022-05-05 DIAGNOSIS — Z79899 Other long term (current) drug therapy: Secondary | ICD-10-CM | POA: Diagnosis not present

## 2022-05-05 DIAGNOSIS — M545 Low back pain, unspecified: Secondary | ICD-10-CM | POA: Diagnosis not present

## 2022-05-05 DIAGNOSIS — M5416 Radiculopathy, lumbar region: Secondary | ICD-10-CM | POA: Diagnosis not present

## 2022-05-05 DIAGNOSIS — M48062 Spinal stenosis, lumbar region with neurogenic claudication: Secondary | ICD-10-CM | POA: Diagnosis not present

## 2022-06-07 DIAGNOSIS — M5416 Radiculopathy, lumbar region: Secondary | ICD-10-CM | POA: Diagnosis not present

## 2022-06-07 DIAGNOSIS — M48062 Spinal stenosis, lumbar region with neurogenic claudication: Secondary | ICD-10-CM | POA: Diagnosis not present

## 2022-06-24 DIAGNOSIS — M5416 Radiculopathy, lumbar region: Secondary | ICD-10-CM | POA: Diagnosis not present

## 2022-06-24 DIAGNOSIS — M4316 Spondylolisthesis, lumbar region: Secondary | ICD-10-CM | POA: Diagnosis not present

## 2022-06-30 DIAGNOSIS — L989 Disorder of the skin and subcutaneous tissue, unspecified: Secondary | ICD-10-CM | POA: Diagnosis not present

## 2022-06-30 DIAGNOSIS — D692 Other nonthrombocytopenic purpura: Secondary | ICD-10-CM | POA: Diagnosis not present

## 2022-07-06 DIAGNOSIS — M4316 Spondylolisthesis, lumbar region: Secondary | ICD-10-CM | POA: Diagnosis not present

## 2022-07-06 DIAGNOSIS — M5451 Vertebrogenic low back pain: Secondary | ICD-10-CM | POA: Diagnosis not present

## 2022-07-06 DIAGNOSIS — M5416 Radiculopathy, lumbar region: Secondary | ICD-10-CM | POA: Diagnosis not present

## 2022-07-24 DIAGNOSIS — E039 Hypothyroidism, unspecified: Secondary | ICD-10-CM | POA: Diagnosis not present

## 2022-07-24 DIAGNOSIS — I10 Essential (primary) hypertension: Secondary | ICD-10-CM | POA: Diagnosis not present

## 2022-07-24 DIAGNOSIS — E119 Type 2 diabetes mellitus without complications: Secondary | ICD-10-CM | POA: Diagnosis not present

## 2022-07-24 DIAGNOSIS — E663 Overweight: Secondary | ICD-10-CM | POA: Diagnosis not present

## 2022-07-24 DIAGNOSIS — M1990 Unspecified osteoarthritis, unspecified site: Secondary | ICD-10-CM | POA: Diagnosis not present

## 2022-07-24 DIAGNOSIS — E7800 Pure hypercholesterolemia, unspecified: Secondary | ICD-10-CM | POA: Diagnosis not present

## 2022-07-24 DIAGNOSIS — Z9849 Cataract extraction status, unspecified eye: Secondary | ICD-10-CM | POA: Diagnosis not present

## 2022-07-24 DIAGNOSIS — G8929 Other chronic pain: Secondary | ICD-10-CM | POA: Diagnosis not present

## 2022-07-24 DIAGNOSIS — H9193 Unspecified hearing loss, bilateral: Secondary | ICD-10-CM | POA: Diagnosis not present

## 2022-07-24 DIAGNOSIS — K5900 Constipation, unspecified: Secondary | ICD-10-CM | POA: Diagnosis not present

## 2022-08-06 DIAGNOSIS — R6889 Other general symptoms and signs: Secondary | ICD-10-CM | POA: Diagnosis not present

## 2022-08-06 DIAGNOSIS — Z20822 Contact with and (suspected) exposure to covid-19: Secondary | ICD-10-CM | POA: Diagnosis not present

## 2022-08-06 DIAGNOSIS — R059 Cough, unspecified: Secondary | ICD-10-CM | POA: Diagnosis not present

## 2022-10-16 DIAGNOSIS — M5451 Vertebrogenic low back pain: Secondary | ICD-10-CM | POA: Diagnosis not present

## 2022-10-28 DIAGNOSIS — I10 Essential (primary) hypertension: Secondary | ICD-10-CM | POA: Diagnosis not present

## 2022-10-28 DIAGNOSIS — E039 Hypothyroidism, unspecified: Secondary | ICD-10-CM | POA: Diagnosis not present

## 2022-10-28 DIAGNOSIS — M545 Low back pain, unspecified: Secondary | ICD-10-CM | POA: Diagnosis not present

## 2022-10-28 DIAGNOSIS — R609 Edema, unspecified: Secondary | ICD-10-CM | POA: Diagnosis not present

## 2022-10-28 DIAGNOSIS — R7303 Prediabetes: Secondary | ICD-10-CM | POA: Diagnosis not present

## 2022-11-11 DIAGNOSIS — M4316 Spondylolisthesis, lumbar region: Secondary | ICD-10-CM | POA: Diagnosis not present

## 2022-11-16 DIAGNOSIS — M48061 Spinal stenosis, lumbar region without neurogenic claudication: Secondary | ICD-10-CM | POA: Diagnosis not present

## 2022-11-16 DIAGNOSIS — M5135 Other intervertebral disc degeneration, thoracolumbar region: Secondary | ICD-10-CM | POA: Diagnosis not present

## 2022-11-16 DIAGNOSIS — M47816 Spondylosis without myelopathy or radiculopathy, lumbar region: Secondary | ICD-10-CM | POA: Diagnosis not present

## 2022-11-16 DIAGNOSIS — M4316 Spondylolisthesis, lumbar region: Secondary | ICD-10-CM | POA: Diagnosis not present

## 2022-11-16 DIAGNOSIS — M5136 Other intervertebral disc degeneration, lumbar region: Secondary | ICD-10-CM | POA: Diagnosis not present

## 2022-11-23 DIAGNOSIS — M5416 Radiculopathy, lumbar region: Secondary | ICD-10-CM | POA: Diagnosis not present

## 2022-11-23 DIAGNOSIS — M4316 Spondylolisthesis, lumbar region: Secondary | ICD-10-CM | POA: Diagnosis not present

## 2022-12-01 ENCOUNTER — Other Ambulatory Visit: Payer: Self-pay | Admitting: Neurological Surgery

## 2022-12-07 NOTE — Progress Notes (Signed)
Surgical Instructions   Your procedure is scheduled on September 4. . Report to [facility] Main Entrance "A" at 1208 P.M., then check in with the Admitting office. Any questions or running late day of surgery: call [phone]  Questions prior to your surgery date: call [phone], Monday-Friday, 8am-4pm. If you experience any cold or flu symptoms such as cough, fever, chills, shortness of breath, etc. between now and your scheduled surgery, please notify us at the above number.     Remember:  Do not eat or drink anything after midnight the night before your surgery.   Take these medicines the morning of surgery with A SIP OF WATER  levothyroxine (SYNTHROID, LEVOTHROID)   May take these medicines IF NEEDED: acetaminophen (TYLENOL)  traMADol (ULTRAM)   One week prior to surgery, STOP taking any Aspirin (unless otherwise instructed by your surgeon) Aleve, Naproxen, Ibuprofen, Motrin, Advil, Goody's, BC's, all herbal medications, fish oil, and non-prescription vitamins.                     Do NOT Smoke (Tobacco/Vaping) for 24 hours prior to your procedure.  If you use a CPAP at night, you may bring your mask/headgear for your overnight stay.   You will be asked to remove any contacts, glasses, piercing's, hearing aid's, dentures/partials prior to surgery. Please bring cases for these items if needed.    Patients discharged the day of surgery will not be allowed to drive home, and someone needs to stay with them for 24 hours.  SURGICAL WAITING ROOM VISITATION Patients may have no more than 2 support people in the waiting area - these visitors may rotate.   Pre-op nurse will coordinate an appropriate time for 1 ADULT support person, who may not rotate, to accompany patient in pre-op.  Children under the age of 87 must have an adult with them who is not the patient and must remain in the main waiting area with an adult.  If the patient needs to stay at the hospital during part of  their recovery, the visitor guidelines for inpatient rooms apply.  Please refer to the [facility] website for the visitor guidelines for any additional information.   If you received a COVID test during your pre-op visit  it is requested that you wear a mask when out in public, stay away from anyone that may not be feeling well and notify your surgeon if you develop symptoms. If you have been in contact with anyone that has tested positive in the last 10 days please notify you surgeon.      Pre-operative 5 CHG Bathing Instructions   You can play a key role in reducing the risk of infection after surgery. Your skin needs to be as free of germs as possible. You can reduce the number of germs on your skin by washing with CHG (chlorhexidine gluconate) soap before surgery. CHG is an antiseptic soap that kills germs and continues to kill germs even after washing.   DO NOT use if you have an allergy to chlorhexidine/CHG or antibacterial soaps. If your skin becomes reddened or irritated, stop using the CHG and notify one of our RNs at [phone].   Please shower with the CHG soap starting 4 days before surgery using the following schedule:     Please keep in mind the following:  DO NOT shave, including legs and underarms, starting the day of your first shower.   You may shave your face at any point before/day of  surgery.  Place clean sheets on your bed the day you start using CHG soap. Use a clean washcloth (not used since being washed) for each shower. DO NOT sleep with pets once you start using the CHG.   CHG Shower Instructions:  If you choose to wash your hair and private area, wash first with your normal shampoo/soap.  After you use shampoo/soap, rinse your hair and body thoroughly to remove shampoo/soap residue.  Turn the water OFF and apply about 3 tablespoons (45 ml) of CHG soap to a CLEAN washcloth.  Apply CHG soap ONLY FROM YOUR NECK DOWN TO YOUR TOES (washing for 3-5 minutes)   DO NOT use CHG soap on face, private areas, open wounds, or sores.  Pay special attention to the area where your surgery is being performed.  If you are having back surgery, having someone wash your back for you may be helpful. Wait 2 minutes after CHG soap is applied, then you may rinse off the CHG soap.  Pat dry with a clean towel  Put on clean clothes/pajamas   If you choose to wear lotion, please use ONLY the CHG-compatible lotions on the back of this paper.   Additional instructions for the day of surgery: DO NOT APPLY any lotions, deodorants, cologne, or perfumes.   Do not bring valuables to the hospital. [facility] is not responsible for any belongings/valuables. Do not wear nail polish, gel polish, artificial nails, or any other type of covering on natural nails (fingers and toes) Do not wear jewelry or makeup Put on clean/comfortable clothes.  Please brush your teeth.  Ask your nurse before applying any prescription medications to the skin.     CHG Compatible Lotions   Aveeno Moisturizing lotion  Cetaphil Moisturizing Cream  Cetaphil Moisturizing Lotion  Clairol Herbal Essence Moisturizing Lotion, Dry Skin  Clairol Herbal Essence Moisturizing Lotion, Extra Dry Skin  Clairol Herbal Essence Moisturizing Lotion, Normal Skin  Curel Age Defying Therapeutic Moisturizing Lotion with Alpha Hydroxy  Curel Extreme Care Body Lotion  Curel Soothing Hands Moisturizing Hand Lotion  Curel Therapeutic Moisturizing Cream, Fragrance-Free  Curel Therapeutic Moisturizing Lotion, Fragrance-Free  Curel Therapeutic Moisturizing Lotion, Original Formula  Eucerin Daily Replenishing Lotion  Eucerin Dry Skin Therapy Plus Alpha Hydroxy Crme  Eucerin Dry Skin Therapy Plus Alpha Hydroxy Lotion  Eucerin Original Crme  Eucerin Original Lotion  Eucerin Plus Crme Eucerin Plus Lotion  Eucerin TriLipid Replenishing Lotion  Keri Anti-Bacterial Hand Lotion  Keri Deep Conditioning Original Lotion  Dry Skin Formula Softly Scented  Keri Deep Conditioning Original Lotion, Fragrance Free Sensitive Skin Formula  Keri Lotion Fast Absorbing Fragrance Free Sensitive Skin Formula  Keri Lotion Fast Absorbing Softly Scented Dry Skin Formula  Keri Original Lotion  Keri Skin Renewal Lotion Keri Silky Smooth Lotion  Keri Silky Smooth Sensitive Skin Lotion  Nivea Body Creamy Conditioning Oil  Nivea Body Extra Enriched Lotion  Nivea Body Original Lotion  Nivea Body Sheer Moisturizing Lotion Nivea Crme  Nivea Skin Firming Lotion  NutraDerm 30 Skin Lotion  NutraDerm Skin Lotion  NutraDerm Therapeutic Skin Cream  NutraDerm Therapeutic Skin Lotion  ProShield Protective Hand Cream  Provon moisturizing lotion  Please read over the following fact sheets that you were given.

## 2022-12-08 ENCOUNTER — Other Ambulatory Visit: Payer: Self-pay

## 2022-12-08 ENCOUNTER — Encounter (HOSPITAL_COMMUNITY)
Admission: RE | Admit: 2022-12-08 | Discharge: 2022-12-08 | Disposition: A | Discharge status: Home / Self Care | Payer: PPO | Source: Ambulatory Visit | Attending: Neurological Surgery | Admitting: Neurological Surgery

## 2022-12-08 ENCOUNTER — Encounter (HOSPITAL_COMMUNITY): Payer: Self-pay | Admitting: *Deleted

## 2022-12-08 VITALS — BP 128/53 | HR 58 | Temp 97.7°F | Resp 18 | Ht 63.0 in | Wt 121.9 lb

## 2022-12-08 DIAGNOSIS — I10 Essential (primary) hypertension: Secondary | ICD-10-CM | POA: Insufficient documentation

## 2022-12-08 DIAGNOSIS — M4316 Spondylolisthesis, lumbar region: Secondary | ICD-10-CM | POA: Diagnosis not present

## 2022-12-08 DIAGNOSIS — M7138 Other bursal cyst, other site: Secondary | ICD-10-CM | POA: Insufficient documentation

## 2022-12-08 DIAGNOSIS — Z01812 Encounter for preprocedural laboratory examination: Secondary | ICD-10-CM | POA: Insufficient documentation

## 2022-12-08 DIAGNOSIS — Z9189 Other specified personal risk factors, not elsewhere classified: Secondary | ICD-10-CM | POA: Diagnosis not present

## 2022-12-08 DIAGNOSIS — Z01818 Encounter for other preprocedural examination: Secondary | ICD-10-CM | POA: Diagnosis present

## 2022-12-08 LAB — BASIC METABOLIC PANEL
Anion gap: 7 (ref 5–15)
BUN: 15 mg/dL (ref 8–23)
CO2: 29 mmol/L (ref 22–32)
Calcium: 9.1 mg/dL (ref 8.9–10.3)
Chloride: 101 mmol/L (ref 98–111)
Creatinine, Ser: 0.76 mg/dL (ref 0.44–1.00)
GFR, Estimated: >60 mL/min (ref >60)
Glucose, Bld: 119 mg/dL — ABNORMAL HIGH (ref 70–99)
Potassium: 4.4 mmol/L (ref 3.5–5.1)
Sodium: 137 mmol/L (ref 135–145)

## 2022-12-08 LAB — TYPE AND SCREEN
ABO/RH(D): O POS
Antibody Screen: NEGATIVE

## 2022-12-08 LAB — PROTIME-INR
INR: 1 (ref 0.8–1.2)
Prothrombin Time: 13.2 seconds (ref 11.4–15.2)

## 2022-12-08 LAB — CBC
HCT: 39 % (ref 36.0–46.0)
Hemoglobin: 12.9 g/dL (ref 12.0–15.0)
MCH: 29.3 pg (ref 26.0–34.0)
MCHC: 33.1 g/dL (ref 30.0–36.0)
MCV: 88.6 fL (ref 80.0–100.0)
Platelets: 200 10*3/uL (ref 150–400)
RBC: 4.4 MIL/uL (ref 3.87–5.11)
RDW: 13.1 % (ref 11.5–15.5)
WBC: 5.6 10*3/uL (ref 4.0–10.5)
nRBC: 0 % (ref 0.0–0.2)

## 2022-12-08 LAB — SURGICAL PCR SCREEN
MRSA, PCR: NEGATIVE
Staphylococcus aureus: POSITIVE — AB

## 2022-12-08 NOTE — Progress Notes (Signed)
PCP - Lonie Peak, PA-C Cardiologist - denies  PPM/ICD - denies   Chest x-ray - 08/27/20 EKG - 10/26/22- records requested Stress Test - denies ECHO - denies Cardiac Cath - denies  Sleep Study - denies   DM- denies  ASA/Blood Thinner Instructions: n/a   ERAS Protcol - no, NPO   COVID TEST- n/a   Anesthesia review: yes, records requested from PCP (EKG tracing and last OV note)  Patient denies shortness of breath, fever, cough and chest pain at PAT appointment   All instructions explained to the patient, with a verbal understanding of the material. Patient agrees to go over the instructions while at home for a better understanding.  The opportunity to ask questions was provided.

## 2022-12-14 NOTE — Progress Notes (Signed)
Pt will arrive tom at 0900, will do NPO post midnight. Spoke with pt's daughter Archie Patten.

## 2022-12-15 ENCOUNTER — Encounter (HOSPITAL_COMMUNITY)
Admission: RE | Disposition: A | Discharge status: Home / Self Care | Payer: Self-pay | Source: Ambulatory Visit | Attending: Neurological Surgery

## 2022-12-15 ENCOUNTER — Observation Stay (HOSPITAL_COMMUNITY)
Admission: RE | Admit: 2022-12-15 | Discharge: 2022-12-16 | Disposition: A | Discharge status: Home / Self Care | Payer: PPO | Source: Ambulatory Visit | Attending: Neurological Surgery | Admitting: Neurological Surgery

## 2022-12-15 ENCOUNTER — Encounter (HOSPITAL_COMMUNITY): Payer: Self-pay | Admitting: Neurological Surgery

## 2022-12-15 ENCOUNTER — Ambulatory Visit (HOSPITAL_COMMUNITY): Payer: PPO

## 2022-12-15 ENCOUNTER — Other Ambulatory Visit: Payer: Self-pay

## 2022-12-15 ENCOUNTER — Ambulatory Visit (HOSPITAL_COMMUNITY): Payer: PPO | Admitting: Physician Assistant

## 2022-12-15 ENCOUNTER — Ambulatory Visit (HOSPITAL_BASED_OUTPATIENT_CLINIC_OR_DEPARTMENT_OTHER): Payer: PPO | Admitting: Certified Registered Nurse Anesthetist

## 2022-12-15 DIAGNOSIS — M48061 Spinal stenosis, lumbar region without neurogenic claudication: Secondary | ICD-10-CM | POA: Insufficient documentation

## 2022-12-15 DIAGNOSIS — E039 Hypothyroidism, unspecified: Secondary | ICD-10-CM | POA: Diagnosis not present

## 2022-12-15 DIAGNOSIS — Z96611 Presence of right artificial shoulder joint: Secondary | ICD-10-CM | POA: Insufficient documentation

## 2022-12-15 DIAGNOSIS — M5416 Radiculopathy, lumbar region: Secondary | ICD-10-CM | POA: Diagnosis not present

## 2022-12-15 DIAGNOSIS — M4316 Spondylolisthesis, lumbar region: Secondary | ICD-10-CM

## 2022-12-15 DIAGNOSIS — Z981 Arthrodesis status: Secondary | ICD-10-CM | POA: Diagnosis not present

## 2022-12-15 DIAGNOSIS — I10 Essential (primary) hypertension: Secondary | ICD-10-CM | POA: Insufficient documentation

## 2022-12-15 DIAGNOSIS — Z96652 Presence of left artificial knee joint: Secondary | ICD-10-CM | POA: Diagnosis not present

## 2022-12-15 DIAGNOSIS — Z96643 Presence of artificial hip joint, bilateral: Secondary | ICD-10-CM | POA: Insufficient documentation

## 2022-12-15 DIAGNOSIS — Z79899 Other long term (current) drug therapy: Secondary | ICD-10-CM | POA: Diagnosis not present

## 2022-12-15 DIAGNOSIS — Z96612 Presence of left artificial shoulder joint: Secondary | ICD-10-CM | POA: Insufficient documentation

## 2022-12-15 DIAGNOSIS — E119 Type 2 diabetes mellitus without complications: Secondary | ICD-10-CM | POA: Diagnosis not present

## 2022-12-15 DIAGNOSIS — M961 Postlaminectomy syndrome, not elsewhere classified: Secondary | ICD-10-CM | POA: Diagnosis not present

## 2022-12-15 HISTORY — PX: LAMINECTOMY WITH POSTERIOR LATERAL ARTHRODESIS LEVEL 1: SHX6335

## 2022-12-15 SURGERY — LAMINECTOMY WITH POSTERIOR LATERAL ARTHRODESIS LEVEL 1
Anesthesia: General | Site: Back | Laterality: Left

## 2022-12-15 MED ORDER — THROMBIN 5000 UNITS EX SOLR
CUTANEOUS | Status: AC
  Filled 2022-12-15: qty 5000

## 2022-12-15 MED ORDER — THROMBIN 20000 UNITS EX SOLR: CUTANEOUS | Status: DC | PRN

## 2022-12-15 MED ORDER — THROMBIN 5000 UNITS EX SOLR: OROMUCOSAL | Status: DC | PRN

## 2022-12-15 MED ORDER — ORAL CARE MOUTH RINSE: 15.0000 mL | Freq: Once | OROMUCOSAL | Status: AC

## 2022-12-15 MED ORDER — ONDANSETRON HCL 4 MG/2ML IJ SOLN: 4.0000 mg | Freq: Once | INTRAMUSCULAR | Status: DC | PRN

## 2022-12-15 MED ORDER — GLYCOPYRROLATE 0.2 MG/ML IJ SOLN
INTRAMUSCULAR | Status: DC | PRN
Start: 2022-12-15 — End: 2022-12-15
  Administered 2022-12-15: .2 mg via INTRAVENOUS

## 2022-12-15 MED ORDER — DEXAMETHASONE SODIUM PHOSPHATE 4 MG/ML IJ SOLN: 4.0000 mg | Freq: Four times a day (QID) | INTRAMUSCULAR | Status: DC

## 2022-12-15 MED ORDER — METHOCARBAMOL 1000 MG/10ML IJ SOLN: 500.0000 mg | Freq: Four times a day (QID) | INTRAVENOUS | Status: DC | PRN

## 2022-12-15 MED ORDER — CHLORHEXIDINE GLUCONATE CLOTH 2 % EX PADS: 6.0000 | MEDICATED_PAD | Freq: Once | CUTANEOUS | Status: DC

## 2022-12-15 MED ORDER — FENTANYL CITRATE (PF) 250 MCG/5ML IJ SOLN
INTRAMUSCULAR | Status: AC
  Filled 2022-12-15: qty 5

## 2022-12-15 MED ORDER — PHENYLEPHRINE HCL-NACL 20-0.9 MG/250ML-% IV SOLN
INTRAVENOUS | Status: DC | PRN
  Administered 2022-12-15: 20 ug/min via INTRAVENOUS

## 2022-12-15 MED ORDER — PHENYLEPHRINE 80 MCG/ML (10ML) SYRINGE FOR IV PUSH (FOR BLOOD PRESSURE SUPPORT)
PREFILLED_SYRINGE | INTRAVENOUS | Status: DC | PRN
  Administered 2022-12-15 (×2): 40 ug via INTRAVENOUS

## 2022-12-15 MED ORDER — THROMBIN 20000 UNITS EX SOLR
CUTANEOUS | Status: AC
  Filled 2022-12-15: qty 20000

## 2022-12-15 MED ORDER — GABAPENTIN 300 MG PO CAPS
300.0000 mg | ORAL_CAPSULE | ORAL | Status: AC
  Administered 2022-12-15: 300 mg via ORAL
  Filled 2022-12-15: qty 1

## 2022-12-15 MED ORDER — VANCOMYCIN HCL IN DEXTROSE 1-5 GM/200ML-% IV SOLN
1000.0000 mg | INTRAVENOUS | Status: AC
  Administered 2022-12-15: 1000 mg via INTRAVENOUS
  Filled 2022-12-15: qty 200

## 2022-12-15 MED ORDER — ROCURONIUM BROMIDE 10 MG/ML (PF) SYRINGE
PREFILLED_SYRINGE | INTRAVENOUS | Status: AC
  Filled 2022-12-15: qty 10

## 2022-12-15 MED ORDER — SODIUM CHLORIDE 0.9% FLUSH: 3.0000 mL | INTRAVENOUS | Status: DC | PRN

## 2022-12-15 MED ORDER — VANCOMYCIN HCL 500 MG/100ML IV SOLN
500.0000 mg | Freq: Once | INTRAVENOUS | Status: AC
  Administered 2022-12-15: 500 mg via INTRAVENOUS
  Filled 2022-12-15: qty 100

## 2022-12-15 MED ORDER — ACETAMINOPHEN 500 MG PO TABS
1000.0000 mg | ORAL_TABLET | ORAL | Status: AC
  Administered 2022-12-15: 1000 mg via ORAL
  Filled 2022-12-15: qty 2

## 2022-12-15 MED ORDER — BUPIVACAINE HCL (PF) 0.25 % IJ SOLN
INTRAMUSCULAR | Status: AC
  Filled 2022-12-15: qty 30

## 2022-12-15 MED ORDER — HYDROCODONE-ACETAMINOPHEN 5-325 MG PO TABS: 1.0000 | ORAL_TABLET | ORAL | Status: DC | PRN

## 2022-12-15 MED ORDER — CEFAZOLIN SODIUM-DEXTROSE 2-4 GM/100ML-% IV SOLN: 2.0000 g | Freq: Three times a day (TID) | INTRAVENOUS | Status: DC

## 2022-12-15 MED ORDER — ACETAMINOPHEN 650 MG RE SUPP: 650.0000 mg | RECTAL | Status: DC | PRN

## 2022-12-15 MED ORDER — BUPIVACAINE HCL (PF) 0.25 % IJ SOLN
INTRAMUSCULAR | Status: DC | PRN
  Administered 2022-12-15: 6 mL

## 2022-12-15 MED ORDER — EPHEDRINE SULFATE-NACL 50-0.9 MG/10ML-% IV SOSY
PREFILLED_SYRINGE | INTRAVENOUS | Status: DC | PRN
  Administered 2022-12-15 (×2): 5 mg via INTRAVENOUS

## 2022-12-15 MED ORDER — SODIUM CHLORIDE 0.9% FLUSH
3.0000 mL | Freq: Two times a day (BID) | INTRAVENOUS | Status: DC
  Administered 2022-12-15: 3 mL via INTRAVENOUS

## 2022-12-15 MED ORDER — SENNA 8.6 MG PO TABS
1.0000 | ORAL_TABLET | Freq: Two times a day (BID) | ORAL | Status: DC
  Administered 2022-12-15: 8.6 mg via ORAL
  Filled 2022-12-15: qty 1

## 2022-12-15 MED ORDER — FENTANYL CITRATE (PF) 250 MCG/5ML IJ SOLN
INTRAMUSCULAR | Status: DC | PRN
  Administered 2022-12-15: 50 ug via INTRAVENOUS
  Administered 2022-12-15: 25 ug via INTRAVENOUS
  Administered 2022-12-15: 50 ug via INTRAVENOUS
  Administered 2022-12-15: 25 ug via INTRAVENOUS

## 2022-12-15 MED ORDER — DEXAMETHASONE SODIUM PHOSPHATE 10 MG/ML IJ SOLN
INTRAMUSCULAR | Status: AC
  Filled 2022-12-15: qty 1

## 2022-12-15 MED ORDER — LACTATED RINGERS IV SOLN: INTRAVENOUS | Status: DC

## 2022-12-15 MED ORDER — CELECOXIB 200 MG PO CAPS
200.0000 mg | ORAL_CAPSULE | Freq: Two times a day (BID) | ORAL | Status: DC
  Administered 2022-12-15 – 2022-12-16 (×2): 200 mg via ORAL
  Filled 2022-12-15 (×2): qty 1

## 2022-12-15 MED ORDER — ACETAMINOPHEN 325 MG PO TABS: 650.0000 mg | ORAL_TABLET | ORAL | Status: DC | PRN

## 2022-12-15 MED ORDER — LEVOTHYROXINE SODIUM 100 MCG PO TABS
100.0000 ug | ORAL_TABLET | Freq: Every day | ORAL | Status: DC
  Administered 2022-12-16: 100 ug via ORAL
  Filled 2022-12-15: qty 1

## 2022-12-15 MED ORDER — PROPOFOL 10 MG/ML IV BOLUS
INTRAVENOUS | Status: DC | PRN
  Administered 2022-12-15: 80 mg via INTRAVENOUS

## 2022-12-15 MED ORDER — DEXAMETHASONE 4 MG PO TABS
4.0000 mg | ORAL_TABLET | Freq: Four times a day (QID) | ORAL | Status: DC
  Administered 2022-12-15 – 2022-12-16 (×3): 4 mg via ORAL
  Filled 2022-12-15 (×3): qty 1

## 2022-12-15 MED ORDER — ONDANSETRON HCL 4 MG PO TABS: 4.0000 mg | ORAL_TABLET | Freq: Four times a day (QID) | ORAL | Status: DC | PRN

## 2022-12-15 MED ORDER — ONDANSETRON HCL 4 MG/2ML IJ SOLN: 4.0000 mg | Freq: Four times a day (QID) | INTRAMUSCULAR | Status: DC | PRN

## 2022-12-15 MED ORDER — SUGAMMADEX SODIUM 200 MG/2ML IV SOLN
INTRAVENOUS | Status: DC | PRN
  Administered 2022-12-15: 200 mg via INTRAVENOUS

## 2022-12-15 MED ORDER — LIDOCAINE 2% (20 MG/ML) 5 ML SYRINGE
INTRAMUSCULAR | Status: DC | PRN
  Administered 2022-12-15: 40 mg via INTRAVENOUS

## 2022-12-15 MED ORDER — ONDANSETRON HCL 4 MG/2ML IJ SOLN
INTRAMUSCULAR | Status: AC
  Filled 2022-12-15: qty 2

## 2022-12-15 MED ORDER — CHLORHEXIDINE GLUCONATE 0.12 % MT SOLN
15.0000 mL | Freq: Once | OROMUCOSAL | Status: AC
  Administered 2022-12-15: 15 mL via OROMUCOSAL
  Filled 2022-12-15: qty 15

## 2022-12-15 MED ORDER — MENTHOL 3 MG MT LOZG: 1.0000 | LOZENGE | OROMUCOSAL | Status: DC | PRN

## 2022-12-15 MED ORDER — SURGIRINSE WOUND IRRIGATION SYSTEM - OPTIME: TOPICAL | Status: DC | PRN

## 2022-12-15 MED ORDER — LIDOCAINE 2% (20 MG/ML) 5 ML SYRINGE
INTRAMUSCULAR | Status: AC
  Filled 2022-12-15: qty 5

## 2022-12-15 MED ORDER — FENTANYL CITRATE (PF) 100 MCG/2ML IJ SOLN: 25.0000 ug | INTRAMUSCULAR | Status: DC | PRN

## 2022-12-15 MED ORDER — POTASSIUM CHLORIDE IN NACL 20-0.9 MEQ/L-% IV SOLN: INTRAVENOUS | Status: DC

## 2022-12-15 MED ORDER — ALBUMIN HUMAN 5 % IV SOLN: INTRAVENOUS | Status: DC | PRN

## 2022-12-15 MED ORDER — SODIUM CHLORIDE 0.9 % IR SOLN
Status: DC | PRN
  Administered 2022-12-15: 1000 mL

## 2022-12-15 MED ORDER — SODIUM CHLORIDE 0.9 % IV SOLN
250.0000 mL | INTRAVENOUS | Status: DC
  Administered 2022-12-15: 250 mL via INTRAVENOUS

## 2022-12-15 MED ORDER — ROCURONIUM BROMIDE 10 MG/ML (PF) SYRINGE
PREFILLED_SYRINGE | INTRAVENOUS | Status: DC | PRN
  Administered 2022-12-15 (×2): 20 mg via INTRAVENOUS
  Administered 2022-12-15: 10 mg via INTRAVENOUS
  Administered 2022-12-15: 40 mg via INTRAVENOUS

## 2022-12-15 MED ORDER — TRAMADOL HCL 50 MG PO TABS
100.0000 mg | ORAL_TABLET | Freq: Four times a day (QID) | ORAL | Status: DC | PRN
  Administered 2022-12-15 – 2022-12-16 (×2): 100 mg via ORAL
  Filled 2022-12-15 (×2): qty 2

## 2022-12-15 MED ORDER — PHENOL 1.4 % MT LIQD: 1.0000 | OROMUCOSAL | Status: DC | PRN

## 2022-12-15 MED ORDER — DEXAMETHASONE SODIUM PHOSPHATE 10 MG/ML IJ SOLN
INTRAMUSCULAR | Status: DC | PRN
  Administered 2022-12-15: 4 mg via INTRAVENOUS

## 2022-12-15 MED ORDER — ONDANSETRON HCL 4 MG/2ML IJ SOLN
INTRAMUSCULAR | Status: DC | PRN
  Administered 2022-12-15: 4 mg via INTRAVENOUS

## 2022-12-15 MED ORDER — METHOCARBAMOL 500 MG PO TABS
500.0000 mg | ORAL_TABLET | Freq: Four times a day (QID) | ORAL | Status: DC | PRN
  Filled 2022-12-15: qty 1

## 2022-12-15 SURGICAL SUPPLY — 60 items
APL SKNCLS STERI-STRIP NONHPOA (GAUZE/BANDAGES/DRESSINGS) ×1
BAG COUNTER SPONGE SURGICOUNT (BAG) ×1 IMPLANT
BAG SPNG CNTER NS LX DISP (BAG) ×1
BASKET BONE COLLECTION (BASKET) IMPLANT
BENZOIN TINCTURE PRP APPL 2/3 (GAUZE/BANDAGES/DRESSINGS) ×1 IMPLANT
BLADE BONE MILL MEDIUM (MISCELLANEOUS) IMPLANT
BLADE CLIPPER SURG (BLADE) IMPLANT
BONE FIBERS PLIAFX 10 (Bone Implant) ×1 IMPLANT
BUR CARBIDE MATCH 3.0 (BURR) ×1 IMPLANT
CANISTER SUCT 3000ML PPV (MISCELLANEOUS) ×1 IMPLANT
CNTNR URN SCR LID CUP LEK RST (MISCELLANEOUS) ×1 IMPLANT
CONT SPEC 4OZ STRL OR WHT (MISCELLANEOUS) ×2
COVER BACK TABLE 60X90IN (DRAPES) ×1 IMPLANT
DRAPE C-ARM 42X72 X-RAY (DRAPES) IMPLANT
DRAPE LAPAROTOMY 100X72X124 (DRAPES) ×1 IMPLANT
DRAPE SURG 17X23 STRL (DRAPES) ×1 IMPLANT
DRSG OPSITE POSTOP 4X6 (GAUZE/BANDAGES/DRESSINGS) IMPLANT
DURAPREP 26ML APPLICATOR (WOUND CARE) ×1 IMPLANT
ELECT REM PT RETURN 9FT ADLT (ELECTROSURGICAL) ×1
ELECTRODE REM PT RTRN 9FT ADLT (ELECTROSURGICAL) ×1 IMPLANT
EVACUATOR 1/8 PVC DRAIN (DRAIN) IMPLANT
GAUZE 4X4 16PLY ~~LOC~~+RFID DBL (SPONGE) IMPLANT
GLOVE BIO SURGEON STRL SZ7 (GLOVE) IMPLANT
GLOVE BIO SURGEON STRL SZ8 (GLOVE) ×2 IMPLANT
GLOVE BIOGEL PI IND STRL 7.0 (GLOVE) IMPLANT
GOWN STRL REUS W/ TWL LRG LVL3 (GOWN DISPOSABLE) IMPLANT
GOWN STRL REUS W/ TWL XL LVL3 (GOWN DISPOSABLE) ×2 IMPLANT
GOWN STRL REUS W/TWL 2XL LVL3 (GOWN DISPOSABLE) IMPLANT
GOWN STRL REUS W/TWL LRG LVL3 (GOWN DISPOSABLE)
GOWN STRL REUS W/TWL XL LVL3 (GOWN DISPOSABLE) ×2
GRAFT BNE FBR PLIAFX PRIME 10 (Bone Implant) IMPLANT
HEMOSTAT POWDER KIT SURGIFOAM (HEMOSTASIS) ×1 IMPLANT
KIT BASIN OR (CUSTOM PROCEDURE TRAY) ×1 IMPLANT
KIT INFUSE X SMALL 1.4CC (Orthopedic Implant) IMPLANT
KIT TURNOVER KIT B (KITS) ×1 IMPLANT
MILL BONE PREP (MISCELLANEOUS) IMPLANT
NDL HYPO 25X1 1.5 SAFETY (NEEDLE) ×1 IMPLANT
NEEDLE HYPO 25X1 1.5 SAFETY (NEEDLE) ×1 IMPLANT
NS IRRIG 1000ML POUR BTL (IV SOLUTION) ×1 IMPLANT
PACK LAMINECTOMY NEURO (CUSTOM PROCEDURE TRAY) ×1 IMPLANT
PAD ARMBOARD 7.5X6 YLW CONV (MISCELLANEOUS) ×3 IMPLANT
PUTTY DBM 5CC (Putty) IMPLANT
ROD LORD LIPPED TI 5.5X40 (Rod) IMPLANT
SCREW CANC PA 6.5X40 (Screw) IMPLANT
SCREW CANC SHANK MOD 6.5X45 (Screw) IMPLANT
SCREW POLYAXIAL TULIP (Screw) IMPLANT
SET SCREW (Screw) ×4 IMPLANT
SET SCREW SPNE (Screw) IMPLANT
SOLUTION IRRIG SURGIPHOR (IV SOLUTION) ×1 IMPLANT
SPONGE SURGIFOAM ABS GEL 100 (HEMOSTASIS) ×1 IMPLANT
SPONGE T-LAP 4X18 ~~LOC~~+RFID (SPONGE) IMPLANT
STRIP CLOSURE SKIN 1/2X4 (GAUZE/BANDAGES/DRESSINGS) ×2 IMPLANT
SUT VIC AB 0 CT1 18XCR BRD8 (SUTURE) ×1 IMPLANT
SUT VIC AB 0 CT1 8-18 (SUTURE) ×1
SUT VIC AB 2-0 CP2 18 (SUTURE) ×1 IMPLANT
SUT VIC AB 3-0 SH 8-18 (SUTURE) ×2 IMPLANT
TOWEL GREEN STERILE (TOWEL DISPOSABLE) ×1 IMPLANT
TOWEL GREEN STERILE FF (TOWEL DISPOSABLE) ×1 IMPLANT
TRAY FOLEY MTR SLVR 16FR STAT (SET/KITS/TRAYS/PACK) IMPLANT
WATER STERILE IRR 1000ML POUR (IV SOLUTION) ×1 IMPLANT

## 2022-12-15 NOTE — Op Note (Signed)
12/15/2022  2:26 PM  PATIENT:  [name]  82 y.o. female  PRE-OPERATIVE DIAGNOSIS: Postlaminectomy spondylolisthesis L4-5, recurrent spinal stenosis L4-5, back pain with left radiculopathy  POST-OPERATIVE DIAGNOSIS:  same  PROCEDURE:   1. Decompressive lumbar laminectomy, hemi facetectomy and foraminotomies L4-5 left  3. Posterior fixation L4-5 using ATEC cortical pedicle screws.  4. Intertransverse arthrodesis L4-5 using morcellized autograft and allograft.  SURGEON:  Marikay Alar, MD  ASSISTANTS: Verlin Dike, FNP  ANESTHESIA:  General  EBL: 150 ml  Total I/O In: 1350 [I.V.:1100; IV Piggyback:250] Out: 150 [Blood:150]  BLOOD ADMINISTERED:none  DRAINS: none   INDICATION FOR PROCEDURE: This patient presented with pain and severe left leg pain. Imaging revealed previous surgery at this level with a postlaminectomy spondylolisthesis and recurrent spinal stenosis with severe left foraminal stenosis. The patient tried a reasonable attempt at conservative medical measures without relief. I recommended decompression and instrumented fusion to address the stenosis as well as the segmental  instability.  Patient understood the risks, benefits, and alternatives and potential outcomes and wished to proceed.  PROCEDURE DETAILS:  The patient was brought to the operating room. After induction of generalized endotracheal anesthesia the patient was rolled into the prone position on chest rolls and all pressure points were padded. The patient's lumbar region was cleaned and then prepped with DuraPrep and draped in the usual sterile fashion. Anesthesia was injected and then a dorsal midline incision was made and carried down to the lumbosacral fascia. The fascia was opened and the paraspinous musculature was taken down in a subperiosteal fashion to expose L4-5.  Was no spinous process so we dissected out over the facets to identify the transverse processes of L4 and L5.  There was a large  arthritic facet on the right with a large posterior synovial cyst.  The left facet was also significantly overgrown.  These were bitten down with a Leksell rongeur.  A self-retaining retractor was placed. Intraoperative fluoroscopy confirmed my level, and I started with placement of the L4 cortical pedicle screws laterally in the right L5 screw. The pedicle screw entry zones were identified utilizing surface landmarks and  AP and lateral fluoroscopy. I scored the cortex with the high-speed drill and then used the hand drill to drill an upward and outward direction into the pedicle. I then tapped line to line. I then placed a 6.5 x 45 mm cortical pedicle screw into the pedicles of L4 bilaterally.  Placed a right 6.5 x 45 mm cortical pedicle screw at L5   I then turned my attention to the redo decompression and complete lumbar laminectomies, hemi- facetectomies, and foraminotomies were performed at L4 5 on the left.  My nurse practitioner was directly involved in the decompression and exposure of the neural elements. Much more generous decompression and generous foraminotomy was undertaken in order to adequately decompress the neural elements and address the patient's leg pain. The yellow ligament was removed to expose the underlying dura and nerve roots, and generous foraminotomies were performed to adequately decompress the neural elements. Both the exiting and traversing nerve roots were decompressed until a coronary dilator passed easily along the nerve roots.  It was significant epidural fibrosis and we removed as much of this as we could perform epidural lysis.  Completely skeletonized the left L4 nerve root that may have been conjoined.     We then turned our attention to the placement of the lower pedicle screw on the left. The pedicle screw entry zone was identified utilizing  surface landmarks and fluoroscopy. I drilled into each pedicle utilizing the hand drill, and tapped each pedicle with the  appropriate tap. We palpated with a ball probe to assure no break in the cortex. We then placed a 6.5 x 40 mm pedicle screw into the pedicles bilaterally at L5 on the left.  My nurse practitioner assisted in placement of the pedicle screws.  We then decorticated the transverse processes and laid a mixture of morcellized autograft and allograft out over these to perform intertransverse arthrodesis at L4-5 bilaterally. We then placed lordotic rods into the multiaxial screw heads of the pedicle screws and locked these in position with the locking caps and anti-torque device. We then checked our construct with AP and lateral fluoroscopy. Irrigated with copious amounts of 0.5% povidone iodine solution followed by saline solution. Inspected the nerve roots once again to assure adequate decompression, lined to the dura with Gelfoam,  and then we closed the muscle and the fascia with 0 Vicryl. Closed the subcutaneous tissues with 2-0 Vicryl and subcuticular tissues with 3-0 Vicryl. The skin was closed with benzoin and Steri-Strips. Dressing was then applied, the patient was awakened from general anesthesia and transported to the recovery room in stable condition. At the end of the procedure all sponge, needle and instrument counts were correct.   PLAN OF CARE: admit to inpatient  PATIENT DISPOSITION:  PACU - hemodynamically stable.   Delay start of Pharmacological VTE agent (>24hrs) due to surgical blood loss or risk of bleeding:  yes

## 2022-12-15 NOTE — Progress Notes (Signed)
Pharmacy Antibiotic Note  [name] is a 82 y.o. female admitted on 12/15/2022 with postlaminectomy surgical prophylaxis.  Pharmacy has been consulted for vancomycin dosing. No drain in place. Pre-op vancomycin given 0939 AM. Low body weight noted. CrCl ~40-45 mL/min.   Plan: Vancomycin 500mg  IV x1 at 2130 PM.  No further dosing required per consult.  Pharmacy to sign off - please re-consult if needed.   Height: 5\' 3"  (160 cm) Weight: 54.9 kg (121 lb) IBW/kg (Calculated) : 52.4  Temp (24hrs), Avg:97.7 F (36.5 C), Min:97.6 F (36.4 C), Max:97.9 F (36.6 C)  No results for input(s): "WBC", "CREATININE", "LATICACIDVEN", "VANCOTROUGH", "VANCOPEAK", "VANCORANDOM", "GENTTROUGH", "GENTPEAK", "GENTRANDOM", "TOBRATROUGH", "TOBRAPEAK", "TOBRARND", "AMIKACINPEAK", "AMIKACINTROU", "AMIKACIN" in the last 168 hours.  Estimated Creatinine Clearance: 43.3 mL/min (by C-G formula based on SCr of 0.76 mg/dL).    Allergies  Allergen Reactions   Penicillins Swelling    UNSPECIFIED SWELLING. Has patient had reaction causing immediate rash, facial/tongue/throat swelling, SOB or lightheadedness, hypotension: No Reaction causing severe rash involving mucus membranes or skin necrosis: No Has patient had a PCN reaction that required hospitalization No Has patient had a PCN reaction occurring within the last 10 years: No If all of the above answers are "NO", then may proceed with Cephalosporin use.    Oxycodone Other (See Comments)    Patient Preference  Makes her feel "weird" and she prefers not to take it    Thank you for allowing pharmacy to be a part of this patient's care.  Link Snuffer, PharmD, BCPS, BCCCP Clinical Pharmacist Please refer to [facility] for [facility] numbers 12/15/2022 4:56 PM

## 2022-12-15 NOTE — Transfer of Care (Signed)
Immediate Anesthesia Transfer of Care Note  Patient: [name]  Procedure(s) Performed: Left Lumbar Four- Lumbar Five Hemifacetectomy with resection of Synovial Cyst, Posterolateral Fusion Lumbar Four- Lumbar Five, non-segmental fixation Lumbar four-Lumbar Five (Left: Back)  Patient Location: PACU  Anesthesia Type:General  Level of Consciousness: awake, alert , oriented, patient cooperative, and responds to stimulation  Airway & Oxygen Therapy: Patient Spontanous Breathing and Patient connected to nasal cannula oxygen  Post-op Assessment: Report given to RN and Post -op Vital signs reviewed and stable  Post vital signs: Reviewed and stable  Last Vitals:  Vitals Value Taken Time  BP 143/62 12/15/22 1424  Temp 97.32F 12/15/22 1426    Pulse 67 12/15/22 1429  Resp 17 12/15/22 1429  SpO2 100 % 12/15/22 1429  Vitals shown include unfiled device data.  Last Pain:  Vitals:   12/15/22 0927  TempSrc:   PainSc: 0-No pain         Complications: No notable events documented.  *Report given to PACU RN for continued care and follow-up.

## 2022-12-15 NOTE — Anesthesia Postprocedure Evaluation (Signed)
Anesthesia Post Note  Patient: [name]  Procedure(s) Performed: Left Lumbar Four- Lumbar Five Hemifacetectomy with resection of Synovial Cyst, Posterolateral Fusion Lumbar Four- Lumbar Five, non-segmental fixation Lumbar four-Lumbar Five (Left: Back)     Patient location during evaluation: PACU Anesthesia Type: General Level of consciousness: awake and alert Pain management: pain level controlled Vital Signs Assessment: post-procedure vital signs reviewed and stable Respiratory status: spontaneous breathing, nonlabored ventilation and respiratory function stable Cardiovascular status: blood pressure returned to baseline and stable Postop Assessment: no apparent nausea or vomiting Anesthetic complications: no   No notable events documented.  Last Vitals:  Vitals:   12/15/22 1500 12/15/22 1527  BP: (!) 158/89 (!) 166/67  Pulse: 65 62  Resp: 17 16  Temp: 36.4 C 36.4 C  SpO2: 99% 93%    Last Pain:  Vitals:   12/15/22 1527  TempSrc: Oral  PainSc:                  Beryle Lathe

## 2022-12-15 NOTE — H&P (Signed)
Subjective: Patient is a 82 y.o. female admitted for back and leg pain.  Previous decompressive laminectomy by another surgeon.  Onset of symptoms was several months ago, gradually worsening since that time.  The pain is rated severe, and is located at the across the lower back and radiates to leg. The pain is described as aching and occurs all day. The symptoms have been progressive. Symptoms are exacerbated by exercise and standing. MRI or CT showed recurrent spinal stenosis L4-5 with a postlaminectomy spondylolisthesis and probable synovial cyst.  Past Medical History:  Diagnosis Date   Arthritis    Diabetes mellitus without complication (HCC)    hx of 2006 no longer on meds, lost weight 40lbs, no longer a diabetic per patient,   Hearing loss    wears hearing aids   Hypertension    Hypothyroidism    Thyroid disease    Wears dentures     Past Surgical History:  Procedure Laterality Date   ANTERIOR AND POSTERIOR REPAIR WITH SACROSPINOUS FIXATION N/A 08/17/2016   Procedure: ANTERIOR AND POSTERIOR REPAIR;  Surgeon: Harold Hedge, MD;  Location: [facility] ORS;  Service: Gynecology;  Laterality: N/A;   APPENDECTOMY     bilateral cataract surgery      CHOLECYSTECTOMY     HAND SURGERY Right    Gramig   HAND SURGERY Left    dr Amanda Pea   LAPAROSCOPIC VAGINAL HYSTERECTOMY WITH SALPINGO OOPHORECTOMY Bilateral 08/17/2016   Procedure: LAPAROSCOPIC ASSISTED VAGINAL HYSTERECTOMY WITH SALPINGO OOPHORECTOMY;  Surgeon: Harold Hedge, MD;  Location: [facility];  Service: Gynecology;  Laterality: Bilateral;   LUMBAR LAMINECTOMY/DECOMPRESSION MICRODISCECTOMY N/A 04/24/2020   Procedure: Microlumbar decompression Lumbar Four-Five Lumbar Three-Four lateral mass fusion with autologous/allograft bone;  Surgeon: Jene Every, MD;  Location: [facility] OR;  Service: Orthopedics;  Laterality: N/A;  posterior   REVERSE SHOULDER ARTHROPLASTY Right 09/03/2015   Procedure: RIGHT REVERSE SHOULDER ARTHROPLASTY;  Surgeon: Beverely Low, MD;   Location: [facility] OR;  Service: Orthopedics;  Laterality: Right;   REVERSE SHOULDER ARTHROPLASTY Left 07/10/2021   Procedure: REVERSE SHOULDER ARTHROPLASTY;  Surgeon: Beverely Low, MD;  Location: [facility] ORS;  Service: Orthopedics;  Laterality: Left;   THYROIDECTOMY     TONSILLECTOMY     TOTAL HIP ARTHROPLASTY     bilateral   TOTAL KNEE ARTHROPLASTY Left 03/28/2015   Procedure: LEFT TOTAL KNEE ARTHROPLASTY;  Surgeon: Eugenia Mcalpine, MD;  Location: [facility] ORS;  Service: Orthopedics;  Laterality: Left;   TUBAL LIGATION     URETHRAL DILATION      Prior to Admission medications   Medication Sig Start Date End Date Taking? Authorizing Provider  furosemide (LASIX) 20 MG tablet Take 20 mg by mouth daily as needed (swelling/fluid retention.).   Yes [provider]  levothyroxine (SYNTHROID, LEVOTHROID) 100 MCG tablet Take 100 mcg by mouth daily before breakfast.  09/04/13  Yes [provider]  Misc Natural Products (IMMUNE FORMULA PO) Take 2 tablets by mouth in the morning.   Yes [provider]  traMADol (ULTRAM) 50 MG tablet Take 100 mg by mouth 4 (four) times daily as needed for moderate pain. 10/16/22  Yes [provider]  acetaminophen (TYLENOL) 650 MG CR tablet Take 650 mg by mouth every 8 (eight) hours as needed for pain.    [provider]  HYDROcodone-acetaminophen (NORCO) 5-325 MG tablet Take 1 tablet by mouth every 6 (six) hours as needed for moderate pain. Patient not taking: Reported on 12/03/2022 07/10/21   Beverely Low, MD  methocarbamol (ROBAXIN) 500 MG  tablet Take 1 tablet (500 mg total) by mouth every 8 (eight) hours as needed for muscle spasms. Patient not taking: Reported on 12/03/2022 07/10/21   Beverely Low, MD   Allergies  Allergen Reactions   Penicillins Swelling    UNSPECIFIED SWELLING. Has patient had reaction causing immediate rash, facial/tongue/throat swelling, SOB or lightheadedness, hypotension: No Reaction causing severe rash involving  mucus membranes or skin necrosis: No Has patient had a PCN reaction that required hospitalization No Has patient had a PCN reaction occurring within the last 10 years: No If all of the above answers are "NO", then may proceed with Cephalosporin use.    Oxycodone Other (See Comments)    Patient Preference  Makes her feel "weird" and she prefers not to take it    Social History   Tobacco Use   Smoking status: Never   Smokeless tobacco: Never  Substance Use Topics   Alcohol use: No    Family History  Problem Relation Age of Onset   CAD Neg Hx    Diabetes Mellitus II Neg Hx      Review of Systems  Positive ROS: neg  All other systems have been reviewed and were otherwise negative with the exception of those mentioned in the HPI and as above.  Objective: Vital signs in last 24 hours: Temp:  [97.9 F (36.6 C)] 97.9 F (36.6 C) (09/04 0904) Resp:  [18] 18 (09/04 0904) BP: (154)/(69) 154/69 (09/04 0904) Weight:  [54.9 kg] 54.9 kg (09/04 0904)  General Appearance: Alert, cooperative, no distress, appears stated age Head: Normocephalic, without obvious abnormality, atraumatic Eyes: PERRL, conjunctiva/corneas clear, EOM's intact    Neck: Supple, symmetrical, trachea midline Back: Symmetric, no curvature, ROM normal, no CVA tenderness Lungs:  respirations unlabored Heart: Regular rate and rhythm Abdomen: Soft, non-tender Extremities: Extremities normal, atraumatic, no cyanosis or edema Pulses: 2+ and symmetric all extremities Skin: Skin color, texture, turgor normal, no rashes or lesions  NEUROLOGIC:   Mental status: Alert and oriented x4,  no aphasia, good attention span, fund of knowledge, and memory Motor Exam - grossly normal Sensory Exam - grossly normal Reflexes: trazce Coordination - grossly normal Gait - grossly normal Balance - grossly normal Cranial Nerves: I: smell Not tested  II: visual acuity  OS: nl    OD: nl  II: visual fields Full to confrontation   II: pupils Equal, round, reactive to light  III,VII: ptosis None  III,IV,VI: extraocular muscles  Full ROM  V: mastication Normal  V: facial light touch sensation  Normal  V,VII: corneal reflex  Present  VII: facial muscle function - upper  Normal  VII: facial muscle function - lower Normal  VIII: hearing Not tested  IX: soft palate elevation  Normal  IX,X: gag reflex Present  XI: trapezius strength  5/5  XI: sternocleidomastoid strength 5/5  XI: neck flexion strength  5/5  XII: tongue strength  Normal    Data Review Lab Results  Component Value Date   WBC 5.6 12/08/2022   HGB 12.9 12/08/2022   HCT 39.0 12/08/2022   MCV 88.6 12/08/2022   PLT 200 12/08/2022   Lab Results  Component Value Date   NA 137 12/08/2022   K 4.4 12/08/2022   CL 101 12/08/2022   CO2 29 12/08/2022   BUN 15 12/08/2022   CREATININE 0.76 12/08/2022   GLUCOSE 119 (H) 12/08/2022   Lab Results  Component Value Date   INR 1.0 12/08/2022    Assessment/Plan:  Estimated body mass  index is 21.43 kg/m as calculated from the following:   Height as of this encounter: 5\' 3"  (1.6 m).   Weight as of this encounter: 54.9 kg. Patient admitted for decompression and instrumented fusion L4-5. Patient has failed a reasonable attempt at conservative therapy.  I explained the condition and procedure to the patient and answered any questions.  Patient wishes to proceed with procedure as planned. Understands risks/ benefits and typical outcomes of procedure.   Tia Alert 12/15/2022 10:47 AM

## 2022-12-15 NOTE — Anesthesia Preprocedure Evaluation (Addendum)
Anesthesia Evaluation  Patient identified by MRN, date of birth, ID band Patient awake    Reviewed: Allergy & Precautions, NPO status , Patient's Chart, lab work & pertinent test results  History of Anesthesia Complications Negative for: history of anesthetic complications  Airway Mallampati: II  TM Distance: >3 FB Neck ROM: Full    Dental  (+) Lower Dentures, Upper Dentures   Pulmonary neg pulmonary ROS   Pulmonary exam normal        Cardiovascular hypertension, Normal cardiovascular exam     Neuro/Psych  Hearing loss   negative psych ROS   GI/Hepatic negative GI ROS, Neg liver ROS,,,  Endo/Other  diabetesHypothyroidism   DM, no longer on medication   Renal/GU negative Renal ROS     Musculoskeletal  (+) Arthritis ,    Abdominal   Peds  Hematology negative hematology ROS (+)   Anesthesia Other Findings   Reproductive/Obstetrics                             Anesthesia Physical Anesthesia Plan  ASA: 2  Anesthesia Plan: General   Post-op Pain Management: Tylenol PO (pre-op)*   Induction: Intravenous  PONV Risk Score and Plan: 3 and Treatment may vary due to age or medical condition, Ondansetron and Propofol infusion  Airway Management Planned: Oral ETT  Additional Equipment: None  Intra-op Plan:   Post-operative Plan: Extubation in OR  Informed Consent: I have reviewed the patients History and Physical, chart, labs and discussed the procedure including the risks, benefits and alternatives for the proposed anesthesia with the patient or authorized representative who has indicated his/her understanding and acceptance.     Dental advisory given  Plan Discussed with: CRNA and Anesthesiologist  Anesthesia Plan Comments:         Anesthesia Quick Evaluation

## 2022-12-15 NOTE — Anesthesia Procedure Notes (Signed)
Procedure Name: Intubation Date/Time: 12/15/2022 11:44 AM  Performed by: Dairl Ponder, CRNAPre-anesthesia Checklist: Patient identified, Emergency Drugs available, Suction available and Patient being monitored Patient Re-evaluated:Patient Re-evaluated prior to induction Oxygen Delivery Method: Circle System Utilized Preoxygenation: Pre-oxygenation with 100% oxygen Induction Type: IV induction Ventilation: Mask ventilation without difficulty Laryngoscope Size: Mac and 3 Grade View: Grade I Tube type: Oral Tube size: 7.0 mm Number of attempts: 1 Airway Equipment and Method: Stylet and Oral airway Placement Confirmation: ETT inserted through vocal cords under direct vision, positive ETCO2 and breath sounds checked- equal and bilateral Secured at: 21 cm Tube secured with: Tape Dental Injury: Teeth and Oropharynx as per pre-operative assessment

## 2022-12-16 DIAGNOSIS — M4316 Spondylolisthesis, lumbar region: Secondary | ICD-10-CM | POA: Diagnosis not present

## 2022-12-16 MED ORDER — HYDROCODONE-ACETAMINOPHEN 5-325 MG PO TABS
1.0000 | ORAL_TABLET | ORAL | Status: DC | PRN
Start: 2022-12-16 — End: 2022-12-16

## 2022-12-16 MED ORDER — TRAMADOL HCL 50 MG PO TABS
50.0000 mg | ORAL_TABLET | Freq: Four times a day (QID) | ORAL | 0 refills | Status: AC | PRN
Start: 2022-12-16 — End: ?

## 2022-12-16 MED ORDER — METHOCARBAMOL 500 MG PO TABS
500.0000 mg | ORAL_TABLET | Freq: Three times a day (TID) | ORAL | Status: AC
Start: 2022-12-16 — End: ?

## 2022-12-16 MED ORDER — HYDROCODONE-ACETAMINOPHEN 5-325 MG PO TABS
1.0000 | ORAL_TABLET | ORAL | Status: AC | PRN
Start: 2022-12-16 — End: ?

## 2022-12-16 NOTE — Discharge Summary (Addendum)
Physician Discharge Summary  Patient ID: [name] MRN: [id number] DOB/AGE: 84/01/1941 84 y.o.  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 The patient tolerated the procedure well and was taken to the recovery room and then to the floor in stable condition. The hospital course was routine. There were no complications. The wound remained clean dry and intact. Pt had appropriate back soreness. No complaints of leg pain or new N/T/W. The patient remained afebrile with stable vital signs, and tolerated a regular diet. The patient continued to increase activities, and pain was well controlled with oral pain medications.   Consults: None  Significant Diagnostic Studies:  Results for orders placed or performed during the hospital encounter of 12/08/22  Surgical pcr screen   Specimen: Nasal Mucosa; Nasal Swab  Result Value Ref Range   MRSA, PCR NEGATIVE NEGATIVE   Staphylococcus aureus POSITIVE (A) NEGATIVE  Protime-INR  Result Value Ref Range   Prothrombin Time 13.2 11.4 - 15.2 seconds   INR 1.0 0.8 - 1.2  Basic metabolic panel per protocol  Result Value Ref Range   Sodium 137 135 - 145 mmol/L   Potassium 4.4 3.5 - 5.1 mmol/L   Chloride 101 98 - 111 mmol/L   CO2 29 22 - 32 mmol/L   Glucose, Bld 119 (H) 70 - 99 mg/dL   BUN 15 8 - 23 mg/dL   Creatinine, Ser 6.38 0.44 - 1.00 mg/dL   Calcium 9.1 8.9 - 75.6 mg/dL   GFR, Estimated >43 >32 mL/min   Anion gap 7 5 - 15  CBC per protocol  Result Value Ref Range   WBC 5.6 4.0 - 10.5 K/uL   RBC 4.40 3.87 - 5.11 MIL/uL   Hemoglobin 12.9 12.0 - 15.0 g/dL   HCT 95.1 88.4 - 16.6 %   MCV 88.6 80.0 - 100.0 fL   MCH 29.3  26.0 - 34.0 pg   MCHC 33.1 30.0 - 36.0 g/dL   RDW 06.3 01.6 - 01.0 %   Platelets 200 150 - 400 K/uL   nRBC 0.0 0.0 - 0.2 %  Type and screen [facility]  Result Value Ref Range   ABO/RH(D) O POS    Antibody Screen NEG    Sample Expiration 12/22/2022,2359    Extend sample reason      NO TRANSFUSIONS OR PREGNANCY IN THE PAST 3 MONTHS Performed at [facility], [address] [place], [place] [address]     DG Lumbar Spine 2-3 Views  Result Date: 12/15/2022 CLINICAL DATA:  Elective surgery. EXAM: LUMBAR SPINE - 2-3 VIEW COMPARISON:  None Available. FINDINGS: Two fluoroscopic spot views of the lumbar spine obtained in the operating room. Posterior rod with intrapedicular screw fusion at L4-L5. Fluoroscopy time 53 seconds. Dose 20.2 mGy. IMPRESSION: Intraoperative fluoroscopy during lumbar fusion. Electronically Signed   By: Narda Rutherford M.D.   On: 12/15/2022 16:04   DG C-Arm 1-60 Min-No Report  Result Date: 12/15/2022 Fluoroscopy was utilized by the requesting physician.  No radiographic interpretation.   DG C-Arm 1-60 Min-No Report  Result Date: 12/15/2022 Fluoroscopy was utilized by the requesting physician.  No radiographic interpretation.   DG C-Arm 1-60 Min-No Report  Result Date: 12/15/2022  Fluoroscopy was utilized by the requesting physician.  No radiographic interpretation.    Antibiotics:  Anti-infectives (From admission, onward)    Start     Dose/Rate Route Frequency Ordered Stop   12/15/22 2130  vancomycin (VANCOREADY) IVPB 500 mg/100 mL        500 mg 100 mL/hr over 60 Minutes Intravenous  Once 12/15/22 1701 12/16/22 0751   12/15/22 1800  ceFAZolin (ANCEF) IVPB 2g/100 mL premix  Status:  Discontinued        2 g 200 mL/hr over 30 Minutes Intravenous Every 8 hours 12/15/22 1515 12/15/22 1656   12/15/22 0915  vancomycin (VANCOCIN) IVPB 1000 mg/200 mL premix        1,000 mg 200 mL/hr over 60 Minutes Intravenous On call to O.R. 12/15/22 0906  12/15/22 1039       Discharge Exam: Blood pressure 121/72, pulse 72, temperature 98.1 F (36.7 C), temperature source Oral, resp. rate 16, height 5\' 3"  (1.6 m), weight 54.9 kg, SpO2 97%. Neurologic: Grossly normal Ambulating and voiding well, incision cdi   Discharge Medications:   Allergies as of 12/16/2022       Reactions   Penicillins Swelling   UNSPECIFIED SWELLING. Has patient had reaction causing immediate rash, facial/tongue/throat swelling, SOB or lightheadedness, hypotension: No Reaction causing severe rash involving mucus membranes or skin necrosis: No Has patient had a PCN reaction that required hospitalization No Has patient had a PCN reaction occurring within the last 10 years: No If all of the above answers are "NO", then may proceed with Cephalosporin use.   Oxycodone Other (See Comments)   Patient Preference  Makes her feel "weird" and she prefers not to take it        Medication List     STOP taking these medications    HYDROcodone-acetaminophen 5-325 MG tablet Commonly known as: Norco   methocarbamol 500 MG tablet Commonly known as: Robaxin       TAKE these medications    acetaminophen 650 MG CR tablet Commonly known as: TYLENOL Take 650 mg by mouth every 8 (eight) hours as needed for pain.   furosemide 20 MG tablet Commonly known as: LASIX Take 20 mg by mouth daily as needed (swelling/fluid retention.).   IMMUNE FORMULA PO Take 2 tablets by mouth in the morning.   levothyroxine 100 MCG tablet Commonly known as: SYNTHROID Take 100 mcg by mouth daily before breakfast.   traMADol 50 MG tablet Commonly known as: ULTRAM Take 1 tablet (50 mg total) by mouth every 6 (six) hours as needed. What changed:  how much to take when to take this reasons to take this               Durable Medical Equipment  (From admission, onward)           Start     Ordered   12/15/22 1521  DME Walker rolling  Once       Question:  Patient needs  a walker to treat with the following condition  Answer:  S/P lumbar fusion   12/15/22 1520   12/15/22 1521  DME 3 n 1  Once        12/15/22 1520            Disposition: home   Final Dx: PLIF L4-5  Discharge Instructions      Remove dressing in 72 hours   Complete by: As directed    Call MD for:  difficulty breathing, headache or visual disturbances  Complete by: As directed    Call MD for:  hives   Complete by: As directed    Call MD for:  persistant dizziness or light-headedness   Complete by: As directed    Call MD for:  persistant nausea and vomiting   Complete by: As directed    Call MD for:  redness, tenderness, or signs of infection (pain, swelling, redness, odor or green/yellow discharge around incision site)   Complete by: As directed    Call MD for:  severe uncontrolled pain   Complete by: As directed    Call MD for:  temperature >100.4   Complete by: As directed    Diet - low sodium heart healthy   Complete by: As directed    Driving Restrictions   Complete by: As directed    No driving for 2 weeks, no riding in the car for 1 week   Increase activity slowly   Complete by: As directed    Lifting restrictions   Complete by: As directed    No lifting more than 8 lbs        Follow-up Information     Arman Bogus, MD. Call.   Specialty: Neurosurgery Why: As needed, If symptoms worsen Contact information: [address] [place] [address] [phone]                  Signed: Tiana Loft [facility] 12/16/2022, 8:00 AM

## 2022-12-16 NOTE — Progress Notes (Signed)
Patient alert and oriented, mae's well, voiding adequate amount of urine, swallowing without difficulty, no c/o pain at time of discharge. Patient discharged home with family. Script and discharged instructions given to patient. Patient and family stated understanding of instructions given. Patient has an appointment with Dr. Lineback °

## 2022-12-16 NOTE — Progress Notes (Signed)
PT Cancellation Note and Discharge  Patient Details Name: [name] MRN: [id number] DOB: 1940/09/21   Cancelled Treatment:    Reason Eval/Treat Not Completed: PT screened, no needs identified, will sign off. Discussed pt case with OT who reports pt is currently mobilizing at a modified independent level and does not require a formal PT evaluation at this time. PT signing off. If needs change, please reconsult.     Marylynn Pearson 12/16/2022, 10:00 AM  Conni Slipper, PT, DPT Acute Rehabilitation Services Secure Chat Preferred Office: [phone]

## 2022-12-16 NOTE — Plan of Care (Signed)

## 2022-12-16 NOTE — Evaluation (Signed)
Occupational Therapy Evaluation Patient Details Name: [name] MRN: [id number] DOB: 14-Sep-1940 Today's Date: 12/16/2022   History of Present Illness 82 yo F s/p PLIF.  PMH includes: DM, B THA, L3-5 decompression.   Clinical Impression   Patient admitted for the procedure above.  PTA she lives at home with her granddaughter, who does assist as needed.  Patient typically walks with a 4WRW in the community, and patient advised to use the 4WRW in the home over the short term until she is more steady.  Patient presents with mild unsteadiness, and will need Min A for lower body ADL at home.  Patient has a good understanding of precautions, all questions answered, able to walk the hall without an AD, climbed 3 stairs, and completed ADL with Min A.  No further OT needs in the acute setting and PT advised they can screen, as she is at her baseline.          If plan is discharge home, recommend the following: Assist for transportation;Assistance with cooking/housework;A little help with bathing/dressing/bathroom    Functional Status Assessment  Patient has had a recent decline in their functional status and demonstrates the ability to make significant improvements in function in a reasonable and predictable amount of time.  Equipment Recommendations  None recommended by OT    Recommendations for Other Services       Precautions / Restrictions Precautions Precautions: Back Precaution Booklet Issued: Yes (comment) Required Braces or Orthoses: Spinal Brace Spinal Brace: Lumbar corset Restrictions Weight Bearing Restrictions: No      Mobility Bed Mobility Overal bed mobility: Modified Independent                  Transfers Overall transfer level: Modified independent Equipment used: Rollator (4 wheels)                      Balance Overall balance assessment: Mild deficits observed, not formally tested                                          ADL either performed or assessed with clinical judgement   ADL Overall ADL's : At baseline                                       General ADL Comments: Needs Min A for LB ADL at baseline.  Uses 4WRW at home.     Vision Patient Visual Report: No change from baseline       Perception Perception: Within Functional Limits       Praxis Praxis: WFL       Pertinent Vitals/Pain Pain Assessment Pain Assessment: No/denies pain     Extremity/Trunk Assessment Upper Extremity Assessment Upper Extremity Assessment: Overall WFL for tasks assessed   Lower Extremity Assessment Lower Extremity Assessment: Overall WFL for tasks assessed   Cervical / Trunk Assessment Cervical / Trunk Assessment: Back Surgery   Communication Communication Communication: No apparent difficulties   Cognition Arousal: Alert Behavior During Therapy: WFL for tasks assessed/performed Overall Cognitive Status: Within Functional Limits for tasks assessed  General Comments   VSS on RA    Exercises     Shoulder Instructions      Home Living Family/patient expects to be discharged to:: Private residence Living Arrangements: Other relatives Available Help at Discharge: Family;Available 24 hours/day Type of Home: House Home Access: Stairs to enter [employer] of Steps: 3 Entrance Stairs-Rails: Right Home Layout: One level     Bathroom Shower/Tub: Chief Strategy Officer: Standard Bathroom Accessibility: Yes How Accessible: Accessible via walker Home Equipment: Grab bars - tub/shower;Hand held shower head;Educational psychologist (4 wheels)          Prior Functioning/Environment Prior Level of Function : Independent/Modified Independent;Driving                        OT Problem List: Impaired balance (sitting and/or standing)      OT Treatment/Interventions:      OT Goals(Current goals can  be found in the care plan section) Acute Rehab OT Goals Patient Stated Goal: Return home OT Goal Formulation: With patient Time For Goal Achievement: 12/20/22 Potential to Achieve Goals: Good  OT Frequency:      Co-evaluation              AM-PAC OT "6 Clicks" Daily Activity     Outcome Measure Help from another person eating meals?: None Help from another person taking care of personal grooming?: None Help from another person toileting, which includes using toliet, bedpan, or urinal?: A Little Help from another person bathing (including washing, rinsing, drying)?: A Little Help from another person to put on and taking off regular upper body clothing?: None Help from another person to put on and taking off regular lower body clothing?: A Little 6 Click Score: 21   End of Session Equipment Utilized During Treatment: Gait belt Nurse Communication: Mobility status  Activity Tolerance: Patient tolerated treatment well Patient left: in bed;with call bell/phone within reach;with family/visitor present  OT Visit Diagnosis: Unsteadiness on feet (R26.81)                Time: 5284-1324 OT Time Calculation (min): 21 min Charges:  OT General Charges $OT Visit: 1 Visit OT Evaluation $OT Eval Moderate Complexity: 1 Mod  12/16/2022  RP, OTR/L  Acute Rehabilitation Services  Office:  [phone]   Suzanna Obey 12/16/2022, 8:38 AM

## 2022-12-21 DIAGNOSIS — M4316 Spondylolisthesis, lumbar region: Secondary | ICD-10-CM | POA: Diagnosis not present

## 2023-01-27 DIAGNOSIS — M5416 Radiculopathy, lumbar region: Secondary | ICD-10-CM | POA: Diagnosis not present

## 2023-01-27 DIAGNOSIS — M4316 Spondylolisthesis, lumbar region: Secondary | ICD-10-CM | POA: Diagnosis not present

## 2023-02-17 DIAGNOSIS — H5203 Hypermetropia, bilateral: Secondary | ICD-10-CM | POA: Diagnosis not present

## 2023-02-17 DIAGNOSIS — E119 Type 2 diabetes mellitus without complications: Secondary | ICD-10-CM | POA: Diagnosis not present

## 2023-05-03 DIAGNOSIS — R609 Edema, unspecified: Secondary | ICD-10-CM | POA: Diagnosis not present

## 2023-05-03 DIAGNOSIS — M545 Low back pain, unspecified: Secondary | ICD-10-CM | POA: Diagnosis not present

## 2023-05-03 DIAGNOSIS — Z1331 Encounter for screening for depression: Secondary | ICD-10-CM | POA: Diagnosis not present

## 2023-05-03 DIAGNOSIS — E039 Hypothyroidism, unspecified: Secondary | ICD-10-CM | POA: Diagnosis not present

## 2023-05-03 DIAGNOSIS — I10 Essential (primary) hypertension: Secondary | ICD-10-CM | POA: Diagnosis not present

## 2023-05-03 DIAGNOSIS — R7303 Prediabetes: Secondary | ICD-10-CM | POA: Diagnosis not present

## 2023-05-03 DIAGNOSIS — Z139 Encounter for screening, unspecified: Secondary | ICD-10-CM | POA: Diagnosis not present

## 2023-05-12 DIAGNOSIS — M48062 Spinal stenosis, lumbar region with neurogenic claudication: Secondary | ICD-10-CM | POA: Diagnosis not present

## 2023-05-12 DIAGNOSIS — M5416 Radiculopathy, lumbar region: Secondary | ICD-10-CM | POA: Diagnosis not present

## 2023-05-12 DIAGNOSIS — M5451 Vertebrogenic low back pain: Secondary | ICD-10-CM | POA: Diagnosis not present

## 2023-06-17 DIAGNOSIS — M25551 Pain in right hip: Secondary | ICD-10-CM | POA: Diagnosis not present

## 2023-06-17 DIAGNOSIS — M7061 Trochanteric bursitis, right hip: Secondary | ICD-10-CM | POA: Diagnosis not present

## 2023-08-12 DIAGNOSIS — M7061 Trochanteric bursitis, right hip: Secondary | ICD-10-CM | POA: Diagnosis not present

## 2023-08-12 DIAGNOSIS — Z96641 Presence of right artificial hip joint: Secondary | ICD-10-CM | POA: Diagnosis not present

## 2023-11-01 DIAGNOSIS — R609 Edema, unspecified: Secondary | ICD-10-CM | POA: Diagnosis not present

## 2023-11-01 DIAGNOSIS — M545 Low back pain, unspecified: Secondary | ICD-10-CM | POA: Diagnosis not present

## 2023-11-01 DIAGNOSIS — R7303 Prediabetes: Secondary | ICD-10-CM | POA: Diagnosis not present

## 2023-11-01 DIAGNOSIS — E039 Hypothyroidism, unspecified: Secondary | ICD-10-CM | POA: Diagnosis not present

## 2023-11-01 DIAGNOSIS — I10 Essential (primary) hypertension: Secondary | ICD-10-CM | POA: Diagnosis not present

## 2023-11-01 DIAGNOSIS — Z9181 History of falling: Secondary | ICD-10-CM | POA: Diagnosis not present

## 2023-11-01 DIAGNOSIS — J309 Allergic rhinitis, unspecified: Secondary | ICD-10-CM | POA: Diagnosis not present

## 2024-01-23 DIAGNOSIS — M7061 Trochanteric bursitis, right hip: Secondary | ICD-10-CM | POA: Diagnosis not present

## 2024-01-23 DIAGNOSIS — M7062 Trochanteric bursitis, left hip: Secondary | ICD-10-CM | POA: Diagnosis not present

## 2024-03-05 DIAGNOSIS — M5459 Other low back pain: Secondary | ICD-10-CM | POA: Diagnosis not present

## 2024-03-05 DIAGNOSIS — M25552 Pain in left hip: Secondary | ICD-10-CM | POA: Diagnosis not present

## 2024-03-05 DIAGNOSIS — Z96642 Presence of left artificial hip joint: Secondary | ICD-10-CM | POA: Diagnosis not present

## 2024-04-13 NOTE — Progress Notes (Signed)
 JOSSELYN HARKINS                                          MRN: [id number]   04/13/2024   The [facility] Quality Team Specialist reviewed this patient medical record for the purposes of chart review for care gap closure. The following were reviewed: chart review for care gap closure-kidney health evaluation for diabetes:eGFR  and uACR.    [facility] Quality Team
